# Patient Record
Sex: Female | Born: 1945 | Race: Black or African American | Hispanic: No | Marital: Married | Smoking: Former smoker
Health system: Southern US, Community
[De-identification: ages and names within clinical notes are randomized; demographics above are authoritative.]

## PROBLEM LIST (undated history)

## (undated) ENCOUNTER — Inpatient Hospital Stay: Admission: EM | Payer: Self-pay | Source: Home / Self Care

## (undated) DIAGNOSIS — D509 Iron deficiency anemia, unspecified: Secondary | ICD-10-CM

## (undated) DIAGNOSIS — D0590 Unspecified type of carcinoma in situ of unspecified breast: Secondary | ICD-10-CM

## (undated) DIAGNOSIS — E785 Hyperlipidemia, unspecified: Secondary | ICD-10-CM

## (undated) DIAGNOSIS — J449 Chronic obstructive pulmonary disease, unspecified: Secondary | ICD-10-CM

## (undated) DIAGNOSIS — E1142 Type 2 diabetes mellitus with diabetic polyneuropathy: Secondary | ICD-10-CM

## (undated) DIAGNOSIS — K219 Gastro-esophageal reflux disease without esophagitis: Secondary | ICD-10-CM

## (undated) DIAGNOSIS — M545 Low back pain, unspecified: Secondary | ICD-10-CM

## (undated) DIAGNOSIS — K2990 Gastroduodenitis, unspecified, without bleeding: Secondary | ICD-10-CM

## (undated) DIAGNOSIS — I10 Essential (primary) hypertension: Secondary | ICD-10-CM

## (undated) DIAGNOSIS — N189 Chronic kidney disease, unspecified: Secondary | ICD-10-CM

## (undated) DIAGNOSIS — R011 Cardiac murmur, unspecified: Secondary | ICD-10-CM

## (undated) DIAGNOSIS — I509 Heart failure, unspecified: Secondary | ICD-10-CM

## (undated) DIAGNOSIS — M1990 Unspecified osteoarthritis, unspecified site: Secondary | ICD-10-CM

## (undated) DIAGNOSIS — N289 Disorder of kidney and ureter, unspecified: Secondary | ICD-10-CM

## (undated) DIAGNOSIS — G4733 Obstructive sleep apnea (adult) (pediatric): Secondary | ICD-10-CM

## (undated) DIAGNOSIS — H547 Unspecified visual loss: Secondary | ICD-10-CM

## (undated) HISTORY — DX: Unspecified visual loss: H54.7

## (undated) HISTORY — DX: Low back pain, unspecified: M54.50

## (undated) HISTORY — DX: Low back pain: M54.5

## (undated) HISTORY — PX: EYE SURGERY: SHX253

## (undated) HISTORY — DX: Essential (primary) hypertension: I10

## (undated) HISTORY — DX: Iron deficiency anemia, unspecified: D50.9

## (undated) HISTORY — DX: Obstructive sleep apnea (adult) (pediatric): G47.33

## (undated) HISTORY — DX: Type 2 diabetes mellitus with diabetic polyneuropathy: E11.42

## (undated) HISTORY — DX: Chronic kidney disease, unspecified: N18.9

## (undated) HISTORY — DX: Disorder of kidney and ureter, unspecified: N28.9

## (undated) HISTORY — DX: Hyperlipidemia, unspecified: E78.5

## (undated) HISTORY — DX: Chronic obstructive pulmonary disease, unspecified: J44.9

## (undated) HISTORY — PX: LIPOMA EXCISION: SHX5283

## (undated) HISTORY — DX: Gastroduodenitis, unspecified, without bleeding: K29.90

## (undated) HISTORY — DX: Unspecified type of carcinoma in situ of unspecified breast: D05.90

---

## 1994-02-02 DIAGNOSIS — K2990 Gastroduodenitis, unspecified, without bleeding: Secondary | ICD-10-CM

## 1994-02-02 HISTORY — DX: Gastroduodenitis, unspecified, without bleeding: K29.90

## 1997-02-02 DIAGNOSIS — D0590 Unspecified type of carcinoma in situ of unspecified breast: Secondary | ICD-10-CM

## 1997-02-02 HISTORY — DX: Unspecified type of carcinoma in situ of unspecified breast: D05.90

## 1997-02-02 HISTORY — PX: BREAST BIOPSY: SHX20

## 1997-02-02 HISTORY — PX: BREAST LUMPECTOMY: SHX2

## 1997-08-24 ENCOUNTER — Ambulatory Visit (HOSPITAL_COMMUNITY): Admission: RE | Admit: 1997-08-24 | Discharge: 1997-08-24 | Payer: Self-pay | Admitting: Obstetrics

## 1997-09-17 ENCOUNTER — Ambulatory Visit (HOSPITAL_COMMUNITY): Admission: RE | Admit: 1997-09-17 | Discharge: 1997-09-17 | Payer: Self-pay | Admitting: General Surgery

## 1997-10-22 ENCOUNTER — Encounter: Admission: RE | Admit: 1997-10-22 | Discharge: 1998-01-20 | Payer: Self-pay | Admitting: Radiation Oncology

## 1998-06-11 ENCOUNTER — Ambulatory Visit (HOSPITAL_COMMUNITY): Admission: RE | Admit: 1998-06-11 | Discharge: 1998-06-11 | Payer: Self-pay | Admitting: Hematology and Oncology

## 1998-06-11 ENCOUNTER — Encounter: Payer: Self-pay | Admitting: Hematology and Oncology

## 1998-11-27 ENCOUNTER — Encounter: Admission: RE | Admit: 1998-11-27 | Discharge: 1998-11-27 | Payer: Self-pay | Admitting: Internal Medicine

## 1998-12-25 ENCOUNTER — Encounter: Admission: RE | Admit: 1998-12-25 | Discharge: 1998-12-25 | Payer: Self-pay | Admitting: Internal Medicine

## 1999-01-23 ENCOUNTER — Encounter: Admission: RE | Admit: 1999-01-23 | Discharge: 1999-01-23 | Payer: Self-pay | Admitting: Internal Medicine

## 1999-02-28 ENCOUNTER — Encounter: Admission: RE | Admit: 1999-02-28 | Discharge: 1999-02-28 | Payer: Self-pay | Admitting: Hematology and Oncology

## 1999-02-28 ENCOUNTER — Encounter: Payer: Self-pay | Admitting: Hematology and Oncology

## 1999-03-12 ENCOUNTER — Encounter: Admission: RE | Admit: 1999-03-12 | Discharge: 1999-03-12 | Payer: Self-pay | Admitting: Internal Medicine

## 1999-04-07 ENCOUNTER — Ambulatory Visit: Admission: RE | Admit: 1999-04-07 | Discharge: 1999-04-07 | Payer: Self-pay | Admitting: Internal Medicine

## 1999-04-24 ENCOUNTER — Encounter: Admission: RE | Admit: 1999-04-24 | Discharge: 1999-04-24 | Payer: Self-pay | Admitting: Hematology and Oncology

## 1999-04-30 ENCOUNTER — Encounter: Admission: RE | Admit: 1999-04-30 | Discharge: 1999-04-30 | Payer: Self-pay | Admitting: Internal Medicine

## 1999-05-13 ENCOUNTER — Other Ambulatory Visit: Admission: RE | Admit: 1999-05-13 | Discharge: 1999-05-13 | Payer: Self-pay | Admitting: Obstetrics & Gynecology

## 1999-05-13 ENCOUNTER — Encounter: Admission: RE | Admit: 1999-05-13 | Discharge: 1999-05-13 | Payer: Self-pay | Admitting: Obstetrics & Gynecology

## 1999-05-21 ENCOUNTER — Ambulatory Visit (HOSPITAL_COMMUNITY): Admission: RE | Admit: 1999-05-21 | Discharge: 1999-05-21 | Payer: Self-pay | Admitting: Obstetrics & Gynecology

## 1999-05-21 ENCOUNTER — Encounter: Payer: Self-pay | Admitting: Obstetrics & Gynecology

## 1999-05-21 ENCOUNTER — Encounter: Admission: RE | Admit: 1999-05-21 | Discharge: 1999-05-21 | Payer: Self-pay | Admitting: Internal Medicine

## 1999-06-04 ENCOUNTER — Encounter: Payer: Self-pay | Admitting: Hematology and Oncology

## 1999-06-04 ENCOUNTER — Encounter: Admission: RE | Admit: 1999-06-04 | Discharge: 1999-06-04 | Payer: Self-pay | Admitting: Hematology and Oncology

## 1999-07-02 ENCOUNTER — Encounter: Admission: RE | Admit: 1999-07-02 | Discharge: 1999-07-02 | Payer: Self-pay | Admitting: Internal Medicine

## 1999-07-02 ENCOUNTER — Ambulatory Visit (HOSPITAL_COMMUNITY): Admission: RE | Admit: 1999-07-02 | Discharge: 1999-07-02 | Payer: Self-pay | Admitting: Internal Medicine

## 1999-07-14 ENCOUNTER — Encounter (INDEPENDENT_AMBULATORY_CARE_PROVIDER_SITE_OTHER): Payer: Self-pay | Admitting: *Deleted

## 1999-07-14 ENCOUNTER — Ambulatory Visit (HOSPITAL_COMMUNITY): Admission: RE | Admit: 1999-07-14 | Discharge: 1999-07-14 | Payer: Self-pay | Admitting: General Surgery

## 1999-07-16 ENCOUNTER — Encounter: Admission: RE | Admit: 1999-07-16 | Discharge: 1999-07-16 | Payer: Self-pay | Admitting: Internal Medicine

## 1999-08-12 ENCOUNTER — Encounter: Admission: RE | Admit: 1999-08-12 | Discharge: 1999-08-12 | Payer: Self-pay | Admitting: Obstetrics

## 1999-08-15 ENCOUNTER — Encounter: Admission: RE | Admit: 1999-08-15 | Discharge: 1999-08-15 | Payer: Self-pay | Admitting: Internal Medicine

## 1999-08-22 ENCOUNTER — Encounter: Admission: RE | Admit: 1999-08-22 | Discharge: 1999-08-22 | Payer: Self-pay | Admitting: Internal Medicine

## 1999-10-01 ENCOUNTER — Encounter: Admission: RE | Admit: 1999-10-01 | Discharge: 1999-10-01 | Payer: Self-pay | Admitting: Internal Medicine

## 1999-10-15 ENCOUNTER — Encounter: Admission: RE | Admit: 1999-10-15 | Discharge: 1999-10-15 | Payer: Self-pay | Admitting: Hematology and Oncology

## 1999-10-23 ENCOUNTER — Encounter: Admission: RE | Admit: 1999-10-23 | Discharge: 1999-10-23 | Payer: Self-pay | Admitting: Obstetrics

## 1999-10-30 ENCOUNTER — Ambulatory Visit (HOSPITAL_COMMUNITY): Admission: RE | Admit: 1999-10-30 | Discharge: 1999-10-30 | Payer: Self-pay | Admitting: Obstetrics

## 1999-10-30 ENCOUNTER — Encounter: Payer: Self-pay | Admitting: Obstetrics

## 1999-11-04 ENCOUNTER — Ambulatory Visit: Admission: RE | Admit: 1999-11-04 | Discharge: 1999-11-04 | Payer: Self-pay | Admitting: Gynecology

## 1999-11-12 ENCOUNTER — Ambulatory Visit (HOSPITAL_COMMUNITY): Admission: RE | Admit: 1999-11-12 | Discharge: 1999-11-12 | Payer: Self-pay | Admitting: Internal Medicine

## 1999-11-12 ENCOUNTER — Encounter: Payer: Self-pay | Admitting: Internal Medicine

## 1999-11-12 ENCOUNTER — Encounter: Admission: RE | Admit: 1999-11-12 | Discharge: 1999-11-12 | Payer: Self-pay | Admitting: Internal Medicine

## 1999-11-18 ENCOUNTER — Inpatient Hospital Stay (HOSPITAL_COMMUNITY): Admission: RE | Admit: 1999-11-18 | Discharge: 1999-11-24 | Payer: Self-pay | Admitting: Gynecologic Oncology

## 1999-11-18 ENCOUNTER — Encounter (INDEPENDENT_AMBULATORY_CARE_PROVIDER_SITE_OTHER): Payer: Self-pay

## 1999-11-18 HISTORY — PX: TOTAL ABDOMINAL HYSTERECTOMY: SHX209

## 1999-11-18 HISTORY — PX: CONDYLOMA EXCISION/FULGURATION: SHX1389

## 1999-11-19 ENCOUNTER — Encounter: Payer: Self-pay | Admitting: Obstetrics

## 1999-11-26 ENCOUNTER — Ambulatory Visit: Admission: RE | Admit: 1999-11-26 | Discharge: 1999-11-26 | Payer: Self-pay | Admitting: Gynecology

## 1999-12-02 ENCOUNTER — Ambulatory Visit: Admission: RE | Admit: 1999-12-02 | Discharge: 1999-12-02 | Payer: Self-pay | Admitting: Gynecology

## 1999-12-16 ENCOUNTER — Ambulatory Visit: Admission: RE | Admit: 1999-12-16 | Discharge: 1999-12-16 | Payer: Self-pay | Admitting: Gynecology

## 2000-01-01 ENCOUNTER — Encounter: Admission: RE | Admit: 2000-01-01 | Discharge: 2000-01-01 | Payer: Self-pay | Admitting: Hematology and Oncology

## 2000-01-14 ENCOUNTER — Ambulatory Visit: Admission: RE | Admit: 2000-01-14 | Discharge: 2000-01-14 | Payer: Self-pay | Admitting: Gynecology

## 2000-02-19 ENCOUNTER — Encounter: Admission: RE | Admit: 2000-02-19 | Discharge: 2000-02-19 | Payer: Self-pay | Admitting: Internal Medicine

## 2000-03-09 ENCOUNTER — Encounter: Payer: Self-pay | Admitting: Hematology and Oncology

## 2000-03-09 ENCOUNTER — Encounter: Admission: RE | Admit: 2000-03-09 | Discharge: 2000-03-09 | Payer: Self-pay | Admitting: Hematology and Oncology

## 2000-03-31 ENCOUNTER — Encounter: Admission: RE | Admit: 2000-03-31 | Discharge: 2000-03-31 | Payer: Self-pay | Admitting: Internal Medicine

## 2000-04-29 ENCOUNTER — Encounter: Admission: RE | Admit: 2000-04-29 | Discharge: 2000-04-29 | Payer: Self-pay | Admitting: Internal Medicine

## 2000-06-15 ENCOUNTER — Encounter: Payer: Self-pay | Admitting: Hematology and Oncology

## 2000-06-15 ENCOUNTER — Encounter: Admission: RE | Admit: 2000-06-15 | Discharge: 2000-06-15 | Payer: Self-pay | Admitting: Hematology and Oncology

## 2000-06-16 ENCOUNTER — Encounter: Admission: RE | Admit: 2000-06-16 | Discharge: 2000-06-16 | Payer: Self-pay | Admitting: Internal Medicine

## 2000-06-25 ENCOUNTER — Encounter: Admission: RE | Admit: 2000-06-25 | Discharge: 2000-06-25 | Payer: Self-pay | Admitting: Internal Medicine

## 2000-08-04 ENCOUNTER — Encounter: Admission: RE | Admit: 2000-08-04 | Discharge: 2000-08-04 | Payer: Self-pay | Admitting: Internal Medicine

## 2000-08-04 ENCOUNTER — Ambulatory Visit (HOSPITAL_COMMUNITY): Admission: RE | Admit: 2000-08-04 | Discharge: 2000-08-04 | Payer: Self-pay | Admitting: Internal Medicine

## 2000-08-04 ENCOUNTER — Encounter: Payer: Self-pay | Admitting: Internal Medicine

## 2000-10-13 ENCOUNTER — Encounter: Admission: RE | Admit: 2000-10-13 | Discharge: 2000-10-13 | Payer: Self-pay | Admitting: Internal Medicine

## 2000-11-24 ENCOUNTER — Encounter: Admission: RE | Admit: 2000-11-24 | Discharge: 2000-11-24 | Payer: Self-pay | Admitting: Internal Medicine

## 2001-02-02 HISTORY — PX: BREAST BIOPSY: SHX20

## 2001-02-09 ENCOUNTER — Encounter: Admission: RE | Admit: 2001-02-09 | Discharge: 2001-02-09 | Payer: Self-pay | Admitting: Internal Medicine

## 2001-04-12 ENCOUNTER — Encounter (HOSPITAL_BASED_OUTPATIENT_CLINIC_OR_DEPARTMENT_OTHER): Payer: Self-pay | Admitting: General Surgery

## 2001-04-13 ENCOUNTER — Encounter: Admission: RE | Admit: 2001-04-13 | Discharge: 2001-04-13 | Payer: Self-pay | Admitting: Internal Medicine

## 2001-04-14 ENCOUNTER — Ambulatory Visit (HOSPITAL_COMMUNITY): Admission: RE | Admit: 2001-04-14 | Discharge: 2001-04-14 | Payer: Self-pay | Admitting: General Surgery

## 2001-04-14 ENCOUNTER — Encounter (INDEPENDENT_AMBULATORY_CARE_PROVIDER_SITE_OTHER): Payer: Self-pay | Admitting: Specialist

## 2001-04-14 ENCOUNTER — Encounter (HOSPITAL_BASED_OUTPATIENT_CLINIC_OR_DEPARTMENT_OTHER): Payer: Self-pay | Admitting: General Surgery

## 2001-06-22 ENCOUNTER — Encounter: Payer: Self-pay | Admitting: Hematology and Oncology

## 2001-06-22 ENCOUNTER — Encounter: Admission: RE | Admit: 2001-06-22 | Discharge: 2001-06-22 | Payer: Self-pay | Admitting: Hematology and Oncology

## 2001-07-13 ENCOUNTER — Ambulatory Visit (HOSPITAL_COMMUNITY): Admission: RE | Admit: 2001-07-13 | Discharge: 2001-07-13 | Payer: Self-pay | Admitting: Internal Medicine

## 2001-07-13 ENCOUNTER — Encounter: Admission: RE | Admit: 2001-07-13 | Discharge: 2001-07-13 | Payer: Self-pay | Admitting: Internal Medicine

## 2001-07-13 ENCOUNTER — Encounter: Payer: Self-pay | Admitting: Internal Medicine

## 2001-08-03 ENCOUNTER — Encounter (HOSPITAL_BASED_OUTPATIENT_CLINIC_OR_DEPARTMENT_OTHER): Payer: Self-pay | Admitting: General Surgery

## 2001-08-03 ENCOUNTER — Encounter: Admission: RE | Admit: 2001-08-03 | Discharge: 2001-08-03 | Payer: Self-pay | Admitting: General Surgery

## 2001-08-10 ENCOUNTER — Encounter: Admission: RE | Admit: 2001-08-10 | Discharge: 2001-08-10 | Payer: Self-pay | Admitting: General Surgery

## 2001-08-10 ENCOUNTER — Encounter (HOSPITAL_BASED_OUTPATIENT_CLINIC_OR_DEPARTMENT_OTHER): Payer: Self-pay | Admitting: General Surgery

## 2001-09-07 ENCOUNTER — Encounter (HOSPITAL_BASED_OUTPATIENT_CLINIC_OR_DEPARTMENT_OTHER): Payer: Self-pay | Admitting: General Surgery

## 2001-09-07 ENCOUNTER — Encounter: Admission: RE | Admit: 2001-09-07 | Discharge: 2001-09-07 | Payer: Self-pay | Admitting: General Surgery

## 2001-09-14 ENCOUNTER — Encounter: Admission: RE | Admit: 2001-09-14 | Discharge: 2001-09-14 | Payer: Self-pay | Admitting: Internal Medicine

## 2001-09-21 ENCOUNTER — Encounter: Admission: RE | Admit: 2001-09-21 | Discharge: 2001-09-21 | Payer: Self-pay | Admitting: General Surgery

## 2001-09-21 ENCOUNTER — Encounter (INDEPENDENT_AMBULATORY_CARE_PROVIDER_SITE_OTHER): Payer: Self-pay | Admitting: *Deleted

## 2001-09-21 ENCOUNTER — Encounter (HOSPITAL_BASED_OUTPATIENT_CLINIC_OR_DEPARTMENT_OTHER): Payer: Self-pay | Admitting: General Surgery

## 2001-10-17 ENCOUNTER — Ambulatory Visit (HOSPITAL_COMMUNITY): Admission: RE | Admit: 2001-10-17 | Discharge: 2001-10-17 | Payer: Self-pay | Admitting: General Surgery

## 2001-10-17 ENCOUNTER — Encounter (HOSPITAL_BASED_OUTPATIENT_CLINIC_OR_DEPARTMENT_OTHER): Payer: Self-pay | Admitting: General Surgery

## 2002-01-11 ENCOUNTER — Encounter: Admission: RE | Admit: 2002-01-11 | Discharge: 2002-01-11 | Payer: Self-pay | Admitting: Internal Medicine

## 2002-03-08 ENCOUNTER — Encounter: Admission: RE | Admit: 2002-03-08 | Discharge: 2002-03-08 | Payer: Self-pay | Admitting: Internal Medicine

## 2002-03-13 ENCOUNTER — Encounter: Payer: Self-pay | Admitting: Oncology

## 2002-03-13 ENCOUNTER — Encounter: Admission: RE | Admit: 2002-03-13 | Discharge: 2002-03-13 | Payer: Self-pay | Admitting: Oncology

## 2002-03-22 ENCOUNTER — Ambulatory Visit (HOSPITAL_COMMUNITY): Admission: RE | Admit: 2002-03-22 | Discharge: 2002-03-22 | Payer: Self-pay | Admitting: Oncology

## 2002-03-22 ENCOUNTER — Encounter: Payer: Self-pay | Admitting: Oncology

## 2002-04-19 ENCOUNTER — Encounter: Admission: RE | Admit: 2002-04-19 | Discharge: 2002-04-19 | Payer: Self-pay | Admitting: Internal Medicine

## 2002-06-21 ENCOUNTER — Encounter: Admission: RE | Admit: 2002-06-21 | Discharge: 2002-06-21 | Payer: Self-pay | Admitting: Internal Medicine

## 2002-08-16 ENCOUNTER — Encounter: Admission: RE | Admit: 2002-08-16 | Discharge: 2002-08-16 | Payer: Self-pay | Admitting: Internal Medicine

## 2002-08-16 ENCOUNTER — Ambulatory Visit (HOSPITAL_COMMUNITY): Admission: RE | Admit: 2002-08-16 | Discharge: 2002-08-16 | Payer: Self-pay | Admitting: Internal Medicine

## 2002-08-16 ENCOUNTER — Encounter: Payer: Self-pay | Admitting: Internal Medicine

## 2002-08-30 ENCOUNTER — Encounter: Admission: RE | Admit: 2002-08-30 | Discharge: 2002-08-30 | Payer: Self-pay | Admitting: Internal Medicine

## 2002-09-11 ENCOUNTER — Encounter (HOSPITAL_COMMUNITY): Admission: RE | Admit: 2002-09-11 | Discharge: 2002-12-10 | Payer: Self-pay | Admitting: Oncology

## 2002-09-11 ENCOUNTER — Encounter: Payer: Self-pay | Admitting: Oncology

## 2002-11-29 ENCOUNTER — Encounter: Admission: RE | Admit: 2002-11-29 | Discharge: 2002-11-29 | Payer: Self-pay | Admitting: Internal Medicine

## 2002-12-20 ENCOUNTER — Encounter: Admission: RE | Admit: 2002-12-20 | Discharge: 2002-12-20 | Payer: Self-pay | Admitting: Internal Medicine

## 2003-03-16 ENCOUNTER — Encounter: Admission: RE | Admit: 2003-03-16 | Discharge: 2003-03-16 | Payer: Self-pay | Admitting: Oncology

## 2003-03-21 ENCOUNTER — Encounter: Admission: RE | Admit: 2003-03-21 | Discharge: 2003-03-21 | Payer: Self-pay | Admitting: Internal Medicine

## 2003-04-20 ENCOUNTER — Encounter: Admission: RE | Admit: 2003-04-20 | Discharge: 2003-04-20 | Payer: Self-pay | Admitting: Internal Medicine

## 2003-06-20 ENCOUNTER — Encounter: Admission: RE | Admit: 2003-06-20 | Discharge: 2003-06-20 | Payer: Self-pay | Admitting: Internal Medicine

## 2003-10-17 ENCOUNTER — Ambulatory Visit (HOSPITAL_COMMUNITY): Admission: RE | Admit: 2003-10-17 | Discharge: 2003-10-17 | Payer: Self-pay | Admitting: Internal Medicine

## 2003-10-17 ENCOUNTER — Ambulatory Visit: Payer: Self-pay | Admitting: Internal Medicine

## 2003-10-29 ENCOUNTER — Ambulatory Visit: Payer: Self-pay | Admitting: Internal Medicine

## 2003-11-19 ENCOUNTER — Ambulatory Visit: Payer: Self-pay | Admitting: Infectious Diseases

## 2003-11-19 ENCOUNTER — Inpatient Hospital Stay (HOSPITAL_COMMUNITY): Admission: EM | Admit: 2003-11-19 | Discharge: 2003-11-20 | Payer: Self-pay | Admitting: Emergency Medicine

## 2003-11-27 ENCOUNTER — Ambulatory Visit: Payer: Self-pay

## 2003-11-27 ENCOUNTER — Inpatient Hospital Stay (HOSPITAL_COMMUNITY): Admission: AD | Admit: 2003-11-27 | Discharge: 2003-12-01 | Payer: Self-pay | Admitting: Internal Medicine

## 2003-12-05 ENCOUNTER — Ambulatory Visit: Payer: Self-pay | Admitting: Internal Medicine

## 2003-12-12 ENCOUNTER — Ambulatory Visit (HOSPITAL_BASED_OUTPATIENT_CLINIC_OR_DEPARTMENT_OTHER): Admission: RE | Admit: 2003-12-12 | Discharge: 2003-12-12 | Payer: Self-pay | Admitting: Infectious Diseases

## 2003-12-31 ENCOUNTER — Ambulatory Visit: Payer: Self-pay | Admitting: Internal Medicine

## 2004-01-08 ENCOUNTER — Ambulatory Visit: Payer: Self-pay | Admitting: Internal Medicine

## 2004-02-06 ENCOUNTER — Ambulatory Visit: Payer: Self-pay | Admitting: Internal Medicine

## 2004-02-06 ENCOUNTER — Ambulatory Visit: Payer: Self-pay

## 2004-02-20 ENCOUNTER — Ambulatory Visit: Payer: Self-pay | Admitting: Internal Medicine

## 2004-03-26 ENCOUNTER — Encounter: Admission: RE | Admit: 2004-03-26 | Discharge: 2004-03-26 | Payer: Self-pay | Admitting: Oncology

## 2004-04-09 ENCOUNTER — Ambulatory Visit: Payer: Self-pay | Admitting: Internal Medicine

## 2004-04-16 ENCOUNTER — Ambulatory Visit: Payer: Self-pay | Admitting: Internal Medicine

## 2004-04-23 ENCOUNTER — Ambulatory Visit: Payer: Self-pay | Admitting: Internal Medicine

## 2004-05-07 ENCOUNTER — Ambulatory Visit: Payer: Self-pay | Admitting: Internal Medicine

## 2004-06-11 ENCOUNTER — Ambulatory Visit: Payer: Self-pay | Admitting: Internal Medicine

## 2004-06-20 ENCOUNTER — Ambulatory Visit: Payer: Self-pay | Admitting: Oncology

## 2004-07-29 ENCOUNTER — Ambulatory Visit: Payer: Self-pay | Admitting: Internal Medicine

## 2004-08-15 ENCOUNTER — Ambulatory Visit (HOSPITAL_COMMUNITY): Admission: RE | Admit: 2004-08-15 | Discharge: 2004-08-15 | Payer: Self-pay | Admitting: Gastroenterology

## 2004-08-15 ENCOUNTER — Encounter (INDEPENDENT_AMBULATORY_CARE_PROVIDER_SITE_OTHER): Payer: Self-pay | Admitting: *Deleted

## 2004-08-15 LAB — HM COLONOSCOPY

## 2004-08-27 ENCOUNTER — Ambulatory Visit (HOSPITAL_COMMUNITY): Admission: RE | Admit: 2004-08-27 | Discharge: 2004-08-27 | Payer: Self-pay | Admitting: Gastroenterology

## 2004-11-05 ENCOUNTER — Ambulatory Visit: Payer: Self-pay | Admitting: Internal Medicine

## 2005-01-14 ENCOUNTER — Ambulatory Visit: Payer: Self-pay | Admitting: Internal Medicine

## 2005-02-27 ENCOUNTER — Ambulatory Visit: Payer: Self-pay | Admitting: Internal Medicine

## 2005-03-27 ENCOUNTER — Encounter: Admission: RE | Admit: 2005-03-27 | Discharge: 2005-03-27 | Payer: Self-pay | Admitting: Oncology

## 2005-04-01 ENCOUNTER — Ambulatory Visit: Payer: Self-pay | Admitting: Internal Medicine

## 2005-05-20 ENCOUNTER — Ambulatory Visit: Payer: Self-pay | Admitting: Internal Medicine

## 2005-06-30 ENCOUNTER — Ambulatory Visit: Payer: Self-pay | Admitting: Oncology

## 2005-06-30 LAB — CBC & DIFF AND RETIC
Basophils Absolute: 0 10*3/uL (ref 0.0–0.1)
Eosinophils Absolute: 0.1 10*3/uL (ref 0.0–0.5)
HGB: 8.9 g/dL — ABNORMAL LOW (ref 11.6–15.9)
MONO#: 0.5 10*3/uL (ref 0.1–0.9)
NEUT#: 4 10*3/uL (ref 1.5–6.5)
Platelets: 310 10*3/uL (ref 145–400)
RBC: 3.81 10*6/uL (ref 3.70–5.32)
RDW: 15.7 % — ABNORMAL HIGH (ref 11.3–14.5)
RETIC #: 88.4 10*3/uL (ref 19.7–115.1)
Retic %: 2.3 % (ref 0.4–2.3)
WBC: 6.1 10*3/uL (ref 3.9–10.0)

## 2005-06-30 LAB — MORPHOLOGY

## 2005-07-03 LAB — FERRITIN: Ferritin: 85 ng/mL (ref 10–291)

## 2005-07-03 LAB — IRON AND TIBC: %SAT: 16 % — ABNORMAL LOW (ref 20–55)

## 2005-07-22 ENCOUNTER — Ambulatory Visit: Payer: Self-pay | Admitting: Internal Medicine

## 2005-08-17 ENCOUNTER — Ambulatory Visit: Payer: Self-pay | Admitting: Oncology

## 2005-09-23 ENCOUNTER — Ambulatory Visit: Payer: Self-pay | Admitting: Internal Medicine

## 2005-11-05 ENCOUNTER — Ambulatory Visit: Payer: Self-pay | Admitting: Internal Medicine

## 2005-11-17 ENCOUNTER — Ambulatory Visit: Payer: Self-pay | Admitting: Internal Medicine

## 2005-11-17 LAB — CONVERTED CEMR LAB
BUN: 19 mg/dL (ref 6–23)
CO2: 28 meq/L (ref 19–32)
Calcium: 8.6 mg/dL (ref 8.4–10.5)
Chloride: 105 meq/L (ref 96–112)
Creatinine, Ser: 1.36 mg/dL — ABNORMAL HIGH (ref 0.40–1.20)
Glucose, Bld: 69 mg/dL — ABNORMAL LOW (ref 70–99)
Potassium: 3.8 meq/L (ref 3.5–5.3)
Sodium: 144 meq/L (ref 135–145)

## 2006-01-13 ENCOUNTER — Ambulatory Visit: Payer: Self-pay | Admitting: Internal Medicine

## 2006-01-13 LAB — CONVERTED CEMR LAB
ALT: 10 units/L (ref 0–35)
AST: 15 units/L (ref 0–37)
Albumin: 4.3 g/dL (ref 3.5–5.2)
Alkaline Phosphatase: 64 units/L (ref 39–117)
BUN: 21 mg/dL (ref 6–23)
Basophils Absolute: 0 10*3/uL (ref 0.0–0.1)
Basophils Relative: 0 % (ref 0–1)
CO2: 24 meq/L (ref 19–32)
Calcium: 9.2 mg/dL (ref 8.4–10.5)
Chloride: 107 meq/L (ref 96–112)
Creatinine, Ser: 1.32 mg/dL — ABNORMAL HIGH (ref 0.40–1.20)
Eosinophils Relative: 2 % (ref 0–4)
Glucose, Bld: 94 mg/dL (ref 70–99)
HCT: 31.8 % — ABNORMAL LOW (ref 34.4–43.3)
Hemoglobin: 9.4 g/dL — ABNORMAL LOW (ref 11.7–14.8)
Lymphocytes Relative: 23 % (ref 15–43)
Lymphs Abs: 1.8 10*3/uL (ref 0.8–3.1)
MCHC: 29.6 g/dL — ABNORMAL LOW (ref 33.1–35.4)
MCV: 74.5 fL — ABNORMAL LOW (ref 78.8–100.0)
Monocytes Absolute: 0.5 10*3/uL (ref 0.2–0.7)
Monocytes Relative: 6 % (ref 3–11)
Neutro Abs: 5.6 10*3/uL (ref 1.8–6.8)
Neutrophils Relative %: 69 % (ref 47–77)
Platelets: 332 10*3/uL (ref 152–374)
Potassium: 4 meq/L (ref 3.5–5.3)
RBC: 4.27 M/uL (ref 3.79–4.96)
RDW: 15.8 % — ABNORMAL HIGH (ref 11.5–15.3)
Sodium: 144 meq/L (ref 135–145)
Total Bilirubin: 0.4 mg/dL (ref 0.3–1.2)
Total Protein: 7.7 g/dL (ref 6.0–8.3)
WBC: 8.1 10*3/uL (ref 3.7–10.0)

## 2006-01-15 IMAGING — CR DG CHEST 1V PORT
1 series · 1 of 1 positions shown · non-contrast
Comparison: none

CLINICAL DATA: Shortness of breath.  Hypertension.  Congestive heart failure.  
 CHEST PORTABLE ONE VIEW 
 AP view of the chest dated 11/19/03 is compared to the prior study of 10/17/03.  Since the previous study, there appears to be slight interval increase in heart size with evidence of vascular congestion and bilateral airspace density felt most likely to represent pulmonary edema.  The pulmonary edema appears to be more striking on the right than left.  
 IMPRESSION
 Findings suggesting cardiomegaly with pulmonary edema with discussion as above.

[view not recorded]
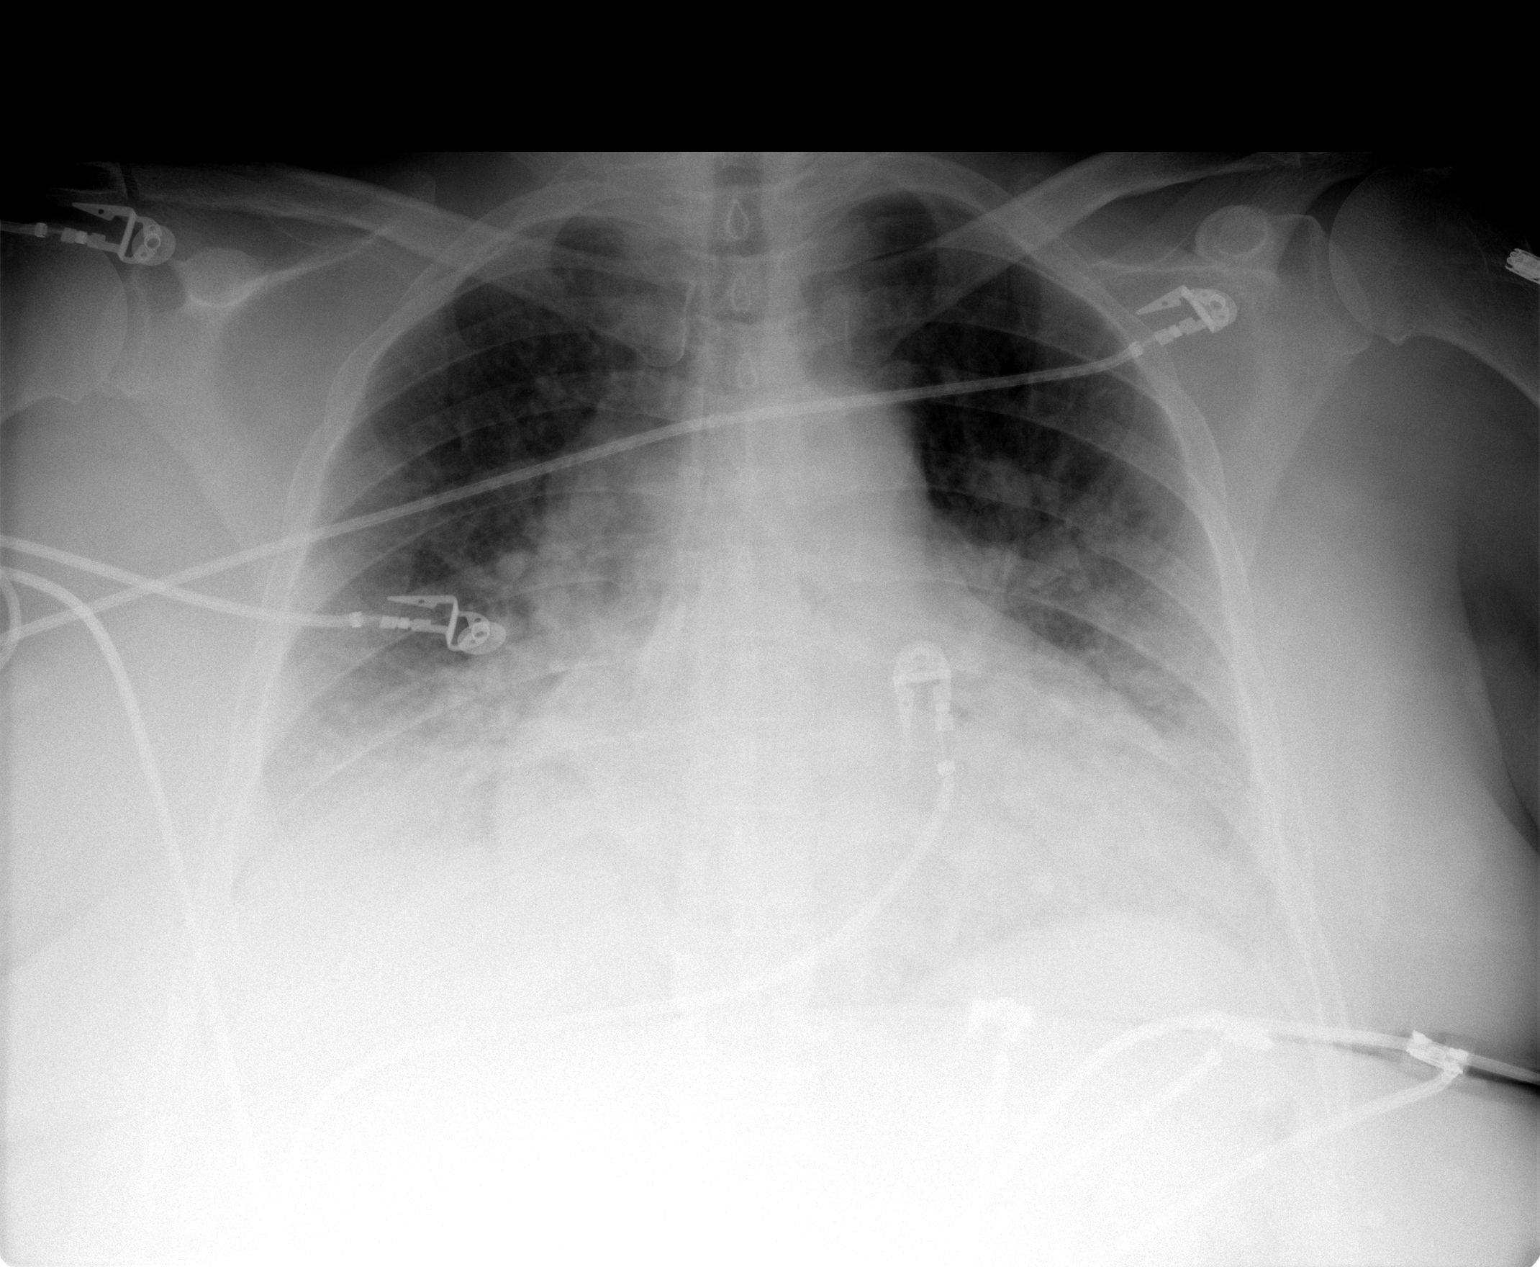

[1 of 1 positions shown; findings below may reference images not displayed]

## 2006-03-10 ENCOUNTER — Ambulatory Visit: Payer: Self-pay | Admitting: Internal Medicine

## 2006-03-10 DIAGNOSIS — N189 Chronic kidney disease, unspecified: Secondary | ICD-10-CM

## 2006-03-10 DIAGNOSIS — E1142 Type 2 diabetes mellitus with diabetic polyneuropathy: Secondary | ICD-10-CM

## 2006-03-10 DIAGNOSIS — E1169 Type 2 diabetes mellitus with other specified complication: Secondary | ICD-10-CM

## 2006-03-10 DIAGNOSIS — D631 Anemia in chronic kidney disease: Secondary | ICD-10-CM

## 2006-03-10 DIAGNOSIS — J449 Chronic obstructive pulmonary disease, unspecified: Secondary | ICD-10-CM

## 2006-03-10 DIAGNOSIS — E785 Hyperlipidemia, unspecified: Secondary | ICD-10-CM

## 2006-03-10 DIAGNOSIS — G4730 Sleep apnea, unspecified: Secondary | ICD-10-CM

## 2006-03-10 DIAGNOSIS — I10 Essential (primary) hypertension: Secondary | ICD-10-CM

## 2006-03-10 DIAGNOSIS — E1159 Type 2 diabetes mellitus with other circulatory complications: Secondary | ICD-10-CM

## 2006-03-10 LAB — CONVERTED CEMR LAB
Glucose, Bld: 169 mg/dL
Hgb A1c MFr Bld: 5.6 %

## 2006-03-11 ENCOUNTER — Encounter: Admission: RE | Admit: 2006-03-11 | Discharge: 2006-03-11 | Payer: Self-pay | Admitting: Internal Medicine

## 2006-03-12 ENCOUNTER — Telehealth: Payer: Self-pay | Admitting: Internal Medicine

## 2006-03-16 ENCOUNTER — Encounter: Payer: Self-pay | Admitting: Internal Medicine

## 2006-03-16 ENCOUNTER — Ambulatory Visit: Payer: Self-pay | Admitting: Hospitalist

## 2006-03-16 LAB — CONVERTED CEMR LAB: Blood Glucose, Fingerstick: 94

## 2006-03-18 ENCOUNTER — Encounter: Payer: Self-pay | Admitting: Internal Medicine

## 2006-03-19 ENCOUNTER — Telehealth (INDEPENDENT_AMBULATORY_CARE_PROVIDER_SITE_OTHER): Payer: Self-pay | Admitting: *Deleted

## 2006-03-22 ENCOUNTER — Ambulatory Visit: Payer: Self-pay | Admitting: Internal Medicine

## 2006-03-26 ENCOUNTER — Encounter: Payer: Self-pay | Admitting: Internal Medicine

## 2006-03-26 LAB — CONVERTED CEMR LAB
ALT: 10 units/L (ref 0–35)
AST: 15 units/L (ref 0–37)
Albumin: 4.1 g/dL (ref 3.5–5.2)
Alkaline Phosphatase: 59 units/L (ref 39–117)
BUN: 26 mg/dL — ABNORMAL HIGH (ref 6–23)
Basophils Absolute: 0 10*3/uL (ref 0.0–0.1)
Basophils Relative: 0 % (ref 0–1)
CO2: 24 meq/L (ref 19–32)
Calcium: 8.2 mg/dL — ABNORMAL LOW (ref 8.4–10.5)
Chloride: 103 meq/L (ref 96–112)
Cholesterol: 90 mg/dL (ref 0–200)
Creatinine, Ser: 1.6 mg/dL — ABNORMAL HIGH (ref 0.40–1.20)
Eosinophils Absolute: 0.2 10*3/uL (ref 0.0–0.7)
Eosinophils Relative: 2 % (ref 0–5)
Ferritin: 77 ng/mL (ref 10–291)
Glucose, Bld: 106 mg/dL — ABNORMAL HIGH (ref 70–99)
HCT: 30.4 % — ABNORMAL LOW (ref 36.0–46.0)
HDL: 32 mg/dL — ABNORMAL LOW (ref >39)
Hemoglobin: 8.7 g/dL — ABNORMAL LOW (ref 12.0–15.0)
LDL Cholesterol: 40 mg/dL (ref 0–99)
Lymphocytes Relative: 25 % (ref 12–46)
Lymphs Abs: 2.1 10*3/uL (ref 0.7–3.3)
MCHC: 28.6 g/dL — ABNORMAL LOW (ref 30.0–36.0)
MCV: 73.1 fL — ABNORMAL LOW (ref 78.0–100.0)
Monocytes Absolute: 0.5 10*3/uL (ref 0.2–0.7)
Monocytes Relative: 6 % (ref 3–11)
Neutro Abs: 5.8 10*3/uL (ref 1.7–7.7)
Neutrophils Relative %: 67 % (ref 43–77)
Platelets: 305 10*3/uL (ref 150–400)
Potassium: 3.9 meq/L (ref 3.5–5.3)
RBC: 4.16 M/uL (ref 3.87–5.11)
RDW: 15.8 % — ABNORMAL HIGH (ref 11.5–14.0)
Sodium: 143 meq/L (ref 135–145)
Total Bilirubin: 0.3 mg/dL (ref 0.3–1.2)
Total CHOL/HDL Ratio: 2.8
Total Protein: 7.7 g/dL (ref 6.0–8.3)
Triglycerides: 89 mg/dL (ref <150)
VLDL: 18 mg/dL (ref 0–40)
WBC: 8.6 10*3/uL (ref 4.0–10.5)

## 2006-03-31 ENCOUNTER — Telehealth (INDEPENDENT_AMBULATORY_CARE_PROVIDER_SITE_OTHER): Payer: Self-pay | Admitting: *Deleted

## 2006-04-02 ENCOUNTER — Telehealth: Payer: Self-pay | Admitting: *Deleted

## 2006-04-09 ENCOUNTER — Encounter (INDEPENDENT_AMBULATORY_CARE_PROVIDER_SITE_OTHER): Payer: Self-pay | Admitting: *Deleted

## 2006-05-04 ENCOUNTER — Telehealth (INDEPENDENT_AMBULATORY_CARE_PROVIDER_SITE_OTHER): Payer: Self-pay | Admitting: *Deleted

## 2006-05-05 ENCOUNTER — Telehealth: Payer: Self-pay | Admitting: *Deleted

## 2006-05-18 ENCOUNTER — Telehealth (INDEPENDENT_AMBULATORY_CARE_PROVIDER_SITE_OTHER): Payer: Self-pay | Admitting: *Deleted

## 2006-06-04 ENCOUNTER — Telehealth (INDEPENDENT_AMBULATORY_CARE_PROVIDER_SITE_OTHER): Payer: Self-pay | Admitting: *Deleted

## 2006-06-23 ENCOUNTER — Encounter: Payer: Self-pay | Admitting: Internal Medicine

## 2006-06-30 ENCOUNTER — Encounter: Payer: Self-pay | Admitting: Internal Medicine

## 2006-07-06 ENCOUNTER — Ambulatory Visit: Payer: Self-pay | Admitting: Oncology

## 2006-07-14 ENCOUNTER — Telehealth: Payer: Self-pay | Admitting: Internal Medicine

## 2006-07-20 ENCOUNTER — Ambulatory Visit: Payer: Self-pay | Admitting: Internal Medicine

## 2006-07-20 ENCOUNTER — Encounter: Payer: Self-pay | Admitting: Internal Medicine

## 2006-07-20 LAB — CONVERTED CEMR LAB
ALT: 11 units/L (ref 0–35)
AST: 16 units/L (ref 0–37)
Albumin: 4.1 g/dL (ref 3.5–5.2)
Alkaline Phosphatase: 54 units/L (ref 39–117)
BUN: 21 mg/dL (ref 6–23)
Basophils Absolute: 0 10*3/uL (ref 0.0–0.1)
Basophils Relative: 0 % (ref 0–1)
CO2: 24 meq/L (ref 19–32)
Calcium: 8.6 mg/dL (ref 8.4–10.5)
Chloride: 105 meq/L (ref 96–112)
Creatinine, Ser: 1.27 mg/dL — ABNORMAL HIGH (ref 0.40–1.20)
Eosinophils Absolute: 0.1 10*3/uL (ref 0.0–0.7)
Eosinophils Relative: 2 % (ref 0–5)
Glucose, Bld: 133 mg/dL — ABNORMAL HIGH (ref 70–99)
HCT: 30.9 % — ABNORMAL LOW (ref 36.0–46.0)
Hemoglobin: 8.9 g/dL — ABNORMAL LOW (ref 12.0–15.0)
Lymphocytes Relative: 23 % (ref 12–46)
Lymphs Abs: 1.6 10*3/uL (ref 0.7–3.3)
MCHC: 28.8 g/dL — ABNORMAL LOW (ref 30.0–36.0)
MCV: 74.5 fL — ABNORMAL LOW (ref 78.0–100.0)
Monocytes Absolute: 0.5 10*3/uL (ref 0.2–0.7)
Monocytes Relative: 7 % (ref 3–11)
Neutro Abs: 4.8 10*3/uL (ref 1.7–7.7)
Neutrophils Relative %: 68 % (ref 43–77)
Platelets: 306 10*3/uL (ref 150–400)
Potassium: 4.2 meq/L (ref 3.5–5.3)
RBC: 4.15 M/uL (ref 3.87–5.11)
RDW: 16.2 % — ABNORMAL HIGH (ref 11.5–14.0)
Sodium: 144 meq/L (ref 135–145)
Total Bilirubin: 0.3 mg/dL (ref 0.3–1.2)
Total Protein: 7.4 g/dL (ref 6.0–8.3)
WBC: 7.1 10*3/uL (ref 4.0–10.5)

## 2006-08-04 ENCOUNTER — Telehealth: Payer: Self-pay | Admitting: *Deleted

## 2006-08-11 ENCOUNTER — Ambulatory Visit: Payer: Self-pay | Admitting: Internal Medicine

## 2006-08-11 LAB — CONVERTED CEMR LAB
BUN: 15 mg/dL (ref 6–23)
Basophils Absolute: 0 10*3/uL (ref 0.0–0.1)
Basophils Relative: 0 % (ref 0–1)
Blood Glucose, Fingerstick: 144
CO2: 27 meq/L (ref 19–32)
Calcium: 8.8 mg/dL (ref 8.4–10.5)
Chloride: 108 meq/L (ref 96–112)
Creatinine, Ser: 1.31 mg/dL — ABNORMAL HIGH (ref 0.40–1.20)
Eosinophils Absolute: 0.1 10*3/uL (ref 0.0–0.7)
Eosinophils Relative: 2 % (ref 0–5)
Ferritin: 91 ng/mL (ref 10–291)
Glucose, Bld: 70 mg/dL (ref 70–99)
HCT: 30.8 % — ABNORMAL LOW (ref 36.0–46.0)
Hemoglobin: 9.2 g/dL — ABNORMAL LOW (ref 12.0–15.0)
Hgb A1c MFr Bld: 5.8 %
Iron: 35 ug/dL — ABNORMAL LOW (ref 42–145)
Lymphocytes Relative: 29 % (ref 12–46)
Lymphs Abs: 2.1 10*3/uL (ref 0.7–3.3)
MCHC: 29.9 g/dL — ABNORMAL LOW (ref 30.0–36.0)
MCV: 73.9 fL — ABNORMAL LOW (ref 78.0–100.0)
Monocytes Absolute: 0.5 10*3/uL (ref 0.2–0.7)
Monocytes Relative: 7 % (ref 3–11)
Neutro Abs: 4.6 10*3/uL (ref 1.7–7.7)
Neutrophils Relative %: 63 % (ref 43–77)
Platelets: 336 10*3/uL (ref 150–400)
Potassium: 4.1 meq/L (ref 3.5–5.3)
RBC: 4.17 M/uL (ref 3.87–5.11)
RDW: 16.2 % — ABNORMAL HIGH (ref 11.5–14.0)
Saturation Ratios: 12 % — ABNORMAL LOW (ref 20–55)
Sodium: 145 meq/L (ref 135–145)
TIBC: 284 ug/dL (ref 250–470)
UIBC: 249 ug/dL
WBC: 7.3 10*3/uL (ref 4.0–10.5)

## 2006-09-03 ENCOUNTER — Telehealth: Payer: Self-pay | Admitting: Internal Medicine

## 2006-09-06 ENCOUNTER — Telehealth: Payer: Self-pay | Admitting: Internal Medicine

## 2006-09-10 ENCOUNTER — Encounter: Payer: Self-pay | Admitting: Internal Medicine

## 2006-10-01 ENCOUNTER — Telehealth: Payer: Self-pay | Admitting: *Deleted

## 2006-11-11 ENCOUNTER — Ambulatory Visit: Payer: Self-pay | Admitting: Internal Medicine

## 2006-11-11 LAB — CONVERTED CEMR LAB
ALT: 12 units/L (ref 0–35)
AST: 18 units/L (ref 0–37)
Albumin: 4 g/dL (ref 3.5–5.2)
Alkaline Phosphatase: 54 units/L (ref 39–117)
BUN: 20 mg/dL (ref 6–23)
Basophils Absolute: 0 10*3/uL (ref 0.0–0.1)
Basophils Relative: 0 % (ref 0–1)
Blood Glucose, Fingerstick: 164
CO2: 24 meq/L (ref 19–32)
Calcium: 8.6 mg/dL (ref 8.4–10.5)
Chloride: 108 meq/L (ref 96–112)
Creatinine, Ser: 1.3 mg/dL — ABNORMAL HIGH (ref 0.40–1.20)
Eosinophils Absolute: 0.2 10*3/uL (ref 0.0–0.7)
Eosinophils Relative: 2 % (ref 0–5)
Glucose, Bld: 100 mg/dL — ABNORMAL HIGH (ref 70–99)
HCT: 30.2 % — ABNORMAL LOW (ref 36.0–46.0)
Hemoglobin: 9.2 g/dL — ABNORMAL LOW (ref 12.0–15.0)
Hgb A1c MFr Bld: 5.6 %
Lymphocytes Relative: 28 % (ref 12–46)
Lymphs Abs: 2.3 10*3/uL (ref 0.7–3.3)
MCHC: 30.5 g/dL (ref 30.0–36.0)
MCV: 71.9 fL — ABNORMAL LOW (ref 78.0–100.0)
Monocytes Absolute: 0.6 10*3/uL (ref 0.2–0.7)
Monocytes Relative: 7 % (ref 3–11)
Neutro Abs: 5.3 10*3/uL (ref 1.7–7.7)
Neutrophils Relative %: 63 % (ref 43–77)
Platelets: 327 10*3/uL (ref 150–400)
Potassium: 3.9 meq/L (ref 3.5–5.3)
RBC: 4.2 M/uL (ref 3.87–5.11)
RDW: 15.9 % — ABNORMAL HIGH (ref 11.5–14.0)
Sodium: 146 meq/L — ABNORMAL HIGH (ref 135–145)
Total Bilirubin: 0.3 mg/dL (ref 0.3–1.2)
Total Protein: 7.2 g/dL (ref 6.0–8.3)
WBC: 8.4 10*3/uL (ref 4.0–10.5)

## 2006-12-06 ENCOUNTER — Telehealth: Payer: Self-pay | Admitting: Internal Medicine

## 2007-01-05 ENCOUNTER — Telehealth: Payer: Self-pay | Admitting: *Deleted

## 2007-02-07 ENCOUNTER — Telehealth: Payer: Self-pay | Admitting: Internal Medicine

## 2007-02-08 ENCOUNTER — Telehealth: Payer: Self-pay | Admitting: *Deleted

## 2007-02-09 ENCOUNTER — Encounter: Payer: Self-pay | Admitting: Internal Medicine

## 2007-03-07 ENCOUNTER — Telehealth: Payer: Self-pay | Admitting: Internal Medicine

## 2007-03-07 ENCOUNTER — Ambulatory Visit: Payer: Self-pay | Admitting: Internal Medicine

## 2007-03-07 LAB — CONVERTED CEMR LAB
Blood Glucose, Fingerstick: 201
Hgb A1c MFr Bld: 6.1 %

## 2007-03-14 ENCOUNTER — Encounter: Admission: RE | Admit: 2007-03-14 | Discharge: 2007-03-14 | Payer: Self-pay | Admitting: Internal Medicine

## 2007-03-14 ENCOUNTER — Telehealth: Payer: Self-pay | Admitting: Internal Medicine

## 2007-03-15 ENCOUNTER — Ambulatory Visit: Payer: Self-pay | Admitting: Internal Medicine

## 2007-03-17 LAB — CONVERTED CEMR LAB
ALT: 11 units/L (ref 0–35)
AST: 14 units/L (ref 0–37)
Albumin: 4.3 g/dL (ref 3.5–5.2)
Alkaline Phosphatase: 56 units/L (ref 39–117)
BUN: 25 mg/dL — ABNORMAL HIGH (ref 6–23)
Basophils Absolute: 0 10*3/uL (ref 0.0–0.1)
Basophils Relative: 0 % (ref 0–1)
CO2: 22 meq/L (ref 19–32)
Calcium: 9.5 mg/dL (ref 8.4–10.5)
Chloride: 108 meq/L (ref 96–112)
Cholesterol: 102 mg/dL (ref 0–200)
Creatinine, Ser: 1.47 mg/dL — ABNORMAL HIGH (ref 0.40–1.20)
Eosinophils Absolute: 0.1 10*3/uL (ref 0.0–0.7)
Eosinophils Relative: 1 % (ref 0–5)
Ferritin: 75 ng/mL (ref 10–291)
Glucose, Bld: 139 mg/dL — ABNORMAL HIGH (ref 70–99)
HCT: 30.8 % — ABNORMAL LOW (ref 36.0–46.0)
HDL: 41 mg/dL (ref >39)
Hemoglobin: 9.2 g/dL — ABNORMAL LOW (ref 12.0–15.0)
LDL Cholesterol: 44 mg/dL (ref 0–99)
Lymphocytes Relative: 28 % (ref 12–46)
Lymphs Abs: 2.2 10*3/uL (ref 0.7–4.0)
MCHC: 29.9 g/dL — ABNORMAL LOW (ref 30.0–36.0)
MCV: 74.9 fL — ABNORMAL LOW (ref 78.0–100.0)
Monocytes Absolute: 0.5 10*3/uL (ref 0.1–1.0)
Monocytes Relative: 6 % (ref 3–12)
Neutro Abs: 5.1 10*3/uL (ref 1.7–7.7)
Neutrophils Relative %: 64 % (ref 43–77)
Platelets: 300 10*3/uL (ref 150–400)
Potassium: 3.9 meq/L (ref 3.5–5.3)
RBC: 4.11 M/uL (ref 3.87–5.11)
RDW: 16.5 % — ABNORMAL HIGH (ref 11.5–15.5)
Sodium: 145 meq/L (ref 135–145)
Total Bilirubin: 0.3 mg/dL (ref 0.3–1.2)
Total CHOL/HDL Ratio: 2.5
Total Protein: 8 g/dL (ref 6.0–8.3)
Triglycerides: 85 mg/dL (ref <150)
VLDL: 17 mg/dL (ref 0–40)
WBC: 7.8 10*3/uL (ref 4.0–10.5)

## 2007-03-18 ENCOUNTER — Encounter: Payer: Self-pay | Admitting: Internal Medicine

## 2007-04-08 ENCOUNTER — Ambulatory Visit: Payer: Self-pay | Admitting: Internal Medicine

## 2007-04-15 LAB — CONVERTED CEMR LAB
BUN: 16 mg/dL (ref 6–23)
CO2: 26 meq/L (ref 19–32)
Calcium: 8.7 mg/dL (ref 8.4–10.5)
Chloride: 107 meq/L (ref 96–112)
Creatinine, Ser: 1.44 mg/dL — ABNORMAL HIGH (ref 0.40–1.20)
Glucose, Bld: 153 mg/dL — ABNORMAL HIGH (ref 70–99)
Potassium: 3.5 meq/L (ref 3.5–5.3)
Sodium: 150 meq/L — ABNORMAL HIGH (ref 135–145)

## 2007-04-19 ENCOUNTER — Ambulatory Visit: Payer: Self-pay | Admitting: Internal Medicine

## 2007-04-19 LAB — CONVERTED CEMR LAB
BUN: 16 mg/dL (ref 6–23)
CO2: 24 meq/L (ref 19–32)
Calcium: 9.1 mg/dL (ref 8.4–10.5)
Chloride: 104 meq/L (ref 96–112)
Creatinine, Ser: 1.23 mg/dL — ABNORMAL HIGH (ref 0.40–1.20)
Glucose, Bld: 149 mg/dL — ABNORMAL HIGH (ref 70–99)
Potassium: 4 meq/L (ref 3.5–5.3)
Sodium: 143 meq/L (ref 135–145)

## 2007-05-11 ENCOUNTER — Encounter: Payer: Self-pay | Admitting: Internal Medicine

## 2007-05-18 ENCOUNTER — Ambulatory Visit: Payer: Self-pay | Admitting: Internal Medicine

## 2007-05-18 LAB — CONVERTED CEMR LAB
Blood Glucose, Fingerstick: 163
Hgb A1c MFr Bld: 6 %

## 2007-05-23 ENCOUNTER — Telehealth: Payer: Self-pay | Admitting: *Deleted

## 2007-05-24 ENCOUNTER — Encounter: Payer: Self-pay | Admitting: Internal Medicine

## 2007-05-30 ENCOUNTER — Ambulatory Visit: Payer: Self-pay | Admitting: Internal Medicine

## 2007-05-30 LAB — CONVERTED CEMR LAB
OCCULT 2: NEGATIVE
OCCULT 3: NEGATIVE

## 2007-06-02 ENCOUNTER — Telehealth: Payer: Self-pay | Admitting: Internal Medicine

## 2007-06-08 LAB — CONVERTED CEMR LAB
BUN: 28 mg/dL — ABNORMAL HIGH (ref 6–23)
Basophils Absolute: 0 10*3/uL (ref 0.0–0.1)
Basophils Relative: 0 % (ref 0–1)
CO2: 24 meq/L (ref 19–32)
Calcium: 9.4 mg/dL (ref 8.4–10.5)
Chloride: 106 meq/L (ref 96–112)
Creatinine, Ser: 1.53 mg/dL — ABNORMAL HIGH (ref 0.40–1.20)
Creatinine, Urine: 39.9 mg/dL
Eosinophils Absolute: 0.2 10*3/uL (ref 0.0–0.7)
Eosinophils Relative: 2 % (ref 0–5)
Ferritin: 86 ng/mL (ref 10–291)
Glucose, Bld: 128 mg/dL — ABNORMAL HIGH (ref 70–99)
HCT: 32.5 % — ABNORMAL LOW (ref 36.0–46.0)
Hemoglobin: 9.6 g/dL — ABNORMAL LOW (ref 12.0–15.0)
Lymphocytes Relative: 22 % (ref 12–46)
Lymphs Abs: 2.3 10*3/uL (ref 0.7–4.0)
MCHC: 29.5 g/dL — ABNORMAL LOW (ref 30.0–36.0)
MCV: 73.9 fL — ABNORMAL LOW (ref 78.0–100.0)
Microalb Creat Ratio: 20.1 mg/g (ref 0.0–30.0)
Microalb, Ur: 0.8 mg/dL (ref 0.00–1.89)
Monocytes Absolute: 0.7 10*3/uL (ref 0.1–1.0)
Monocytes Relative: 6 % (ref 3–12)
Neutro Abs: 7.3 10*3/uL (ref 1.7–7.7)
Neutrophils Relative %: 70 % (ref 43–77)
Platelets: 381 10*3/uL (ref 150–400)
Potassium: 4.3 meq/L (ref 3.5–5.3)
RBC: 4.4 M/uL (ref 3.87–5.11)
RDW: 15.9 % — ABNORMAL HIGH (ref 11.5–15.5)
Sodium: 147 meq/L — ABNORMAL HIGH (ref 135–145)
WBC: 10.5 10*3/uL (ref 4.0–10.5)

## 2007-06-13 ENCOUNTER — Ambulatory Visit: Payer: Self-pay | Admitting: Internal Medicine

## 2007-06-13 LAB — CONVERTED CEMR LAB
Calcium: 8.1 mg/dL — ABNORMAL LOW (ref 8.4–10.5)
Chloride: 112 meq/L (ref 96–112)
Creatinine, Ser: 1.42 mg/dL — ABNORMAL HIGH (ref 0.40–1.20)
Sodium: 146 meq/L — ABNORMAL HIGH (ref 135–145)

## 2007-06-20 ENCOUNTER — Encounter: Payer: Self-pay | Admitting: Internal Medicine

## 2007-06-29 ENCOUNTER — Telehealth: Payer: Self-pay | Admitting: *Deleted

## 2007-08-04 ENCOUNTER — Telehealth: Payer: Self-pay | Admitting: Internal Medicine

## 2007-09-01 ENCOUNTER — Telehealth: Payer: Self-pay | Admitting: Internal Medicine

## 2007-09-09 ENCOUNTER — Telehealth (INDEPENDENT_AMBULATORY_CARE_PROVIDER_SITE_OTHER): Payer: Self-pay | Admitting: Pharmacy Technician

## 2007-09-21 ENCOUNTER — Ambulatory Visit: Payer: Self-pay | Admitting: Internal Medicine

## 2007-09-21 DIAGNOSIS — D179 Benign lipomatous neoplasm, unspecified: Secondary | ICD-10-CM | POA: Insufficient documentation

## 2007-09-21 LAB — CONVERTED CEMR LAB
Albumin: 4.3 g/dL (ref 3.5–5.2)
Alkaline Phosphatase: 58 units/L (ref 39–117)
Blood Glucose, Fingerstick: 157
CO2: 25 meq/L (ref 19–32)
Calcium: 9 mg/dL (ref 8.4–10.5)
Chloride: 106 meq/L (ref 96–112)
Glucose, Bld: 120 mg/dL — ABNORMAL HIGH (ref 70–99)
Lymphocytes Relative: 23 % (ref 12–46)
Lymphs Abs: 2.1 10*3/uL (ref 0.7–4.0)
Neutrophils Relative %: 68 % (ref 43–77)
Platelets: 315 10*3/uL (ref 150–400)
Potassium: 4.3 meq/L (ref 3.5–5.3)
RBC: 4.51 M/uL (ref 3.87–5.11)
Sodium: 145 meq/L (ref 135–145)
Total Protein: 8.1 g/dL (ref 6.0–8.3)
WBC: 9.2 10*3/uL (ref 4.0–10.5)

## 2007-09-23 ENCOUNTER — Encounter: Payer: Self-pay | Admitting: Internal Medicine

## 2007-09-23 DIAGNOSIS — Z86000 Personal history of in-situ neoplasm of breast: Secondary | ICD-10-CM

## 2007-09-23 DIAGNOSIS — Z90710 Acquired absence of both cervix and uterus: Secondary | ICD-10-CM | POA: Insufficient documentation

## 2007-09-28 ENCOUNTER — Encounter: Payer: Self-pay | Admitting: Internal Medicine

## 2007-11-07 ENCOUNTER — Telehealth: Payer: Self-pay | Admitting: Internal Medicine

## 2007-11-11 ENCOUNTER — Encounter: Payer: Self-pay | Admitting: Internal Medicine

## 2007-12-07 ENCOUNTER — Encounter: Payer: Self-pay | Admitting: Internal Medicine

## 2007-12-14 ENCOUNTER — Ambulatory Visit: Payer: Self-pay | Admitting: Internal Medicine

## 2007-12-14 DIAGNOSIS — E1142 Type 2 diabetes mellitus with diabetic polyneuropathy: Secondary | ICD-10-CM | POA: Insufficient documentation

## 2007-12-14 LAB — CONVERTED CEMR LAB
Blood Glucose, Fingerstick: 173
Hgb A1c MFr Bld: 6.9 %

## 2007-12-15 DIAGNOSIS — N185 Chronic kidney disease, stage 5: Secondary | ICD-10-CM

## 2007-12-15 DIAGNOSIS — E1122 Type 2 diabetes mellitus with diabetic chronic kidney disease: Secondary | ICD-10-CM | POA: Insufficient documentation

## 2007-12-15 LAB — CONVERTED CEMR LAB
ALT: 14 units/L (ref 0–35)
Alkaline Phosphatase: 64 units/L (ref 39–117)
Basophils Absolute: 0 10*3/uL (ref 0.0–0.1)
CO2: 23 meq/L (ref 19–32)
Eosinophils Relative: 2 % (ref 0–5)
HCT: 33.5 % — ABNORMAL LOW (ref 36.0–46.0)
Lymphocytes Relative: 29 % (ref 12–46)
Platelets: 327 10*3/uL (ref 150–400)
RDW: 16.1 % — ABNORMAL HIGH (ref 11.5–15.5)
Sodium: 144 meq/L (ref 135–145)
Total Bilirubin: 0.3 mg/dL (ref 0.3–1.2)
Total Protein: 8.1 g/dL (ref 6.0–8.3)

## 2007-12-28 ENCOUNTER — Telehealth (INDEPENDENT_AMBULATORY_CARE_PROVIDER_SITE_OTHER): Payer: Self-pay | Admitting: *Deleted

## 2008-01-04 ENCOUNTER — Telehealth: Payer: Self-pay | Admitting: Internal Medicine

## 2008-01-10 ENCOUNTER — Ambulatory Visit: Payer: Self-pay | Admitting: Internal Medicine

## 2008-01-10 LAB — CONVERTED CEMR LAB
BUN: 21 mg/dL (ref 6–23)
Calcium: 8.9 mg/dL (ref 8.4–10.5)
Creatinine, Ser: 1.46 mg/dL — ABNORMAL HIGH (ref 0.40–1.20)
Glucose, Bld: 293 mg/dL — ABNORMAL HIGH (ref 70–99)
Sodium: 144 meq/L (ref 135–145)

## 2008-02-07 ENCOUNTER — Encounter: Payer: Self-pay | Admitting: Internal Medicine

## 2008-02-07 ENCOUNTER — Telehealth: Payer: Self-pay | Admitting: *Deleted

## 2008-02-15 ENCOUNTER — Encounter: Payer: Self-pay | Admitting: Internal Medicine

## 2008-03-06 ENCOUNTER — Telehealth: Payer: Self-pay | Admitting: Internal Medicine

## 2008-03-07 ENCOUNTER — Telehealth: Payer: Self-pay | Admitting: Internal Medicine

## 2008-03-23 ENCOUNTER — Encounter: Payer: Self-pay | Admitting: Internal Medicine

## 2008-04-18 ENCOUNTER — Ambulatory Visit: Payer: Self-pay | Admitting: Internal Medicine

## 2008-04-18 DIAGNOSIS — K029 Dental caries, unspecified: Secondary | ICD-10-CM | POA: Insufficient documentation

## 2008-04-18 LAB — CONVERTED CEMR LAB: Hgb A1c MFr Bld: 6.6 %

## 2008-04-27 ENCOUNTER — Encounter: Payer: Self-pay | Admitting: Internal Medicine

## 2008-04-27 ENCOUNTER — Ambulatory Visit: Payer: Self-pay | Admitting: Internal Medicine

## 2008-04-27 LAB — CONVERTED CEMR LAB
Albumin: 4 g/dL (ref 3.5–5.2)
BUN: 19 mg/dL (ref 6–23)
Basophils Absolute: 0 10*3/uL (ref 0.0–0.1)
CO2: 23 meq/L (ref 19–32)
Calcium: 9.1 mg/dL (ref 8.4–10.5)
Chloride: 106 meq/L (ref 96–112)
Eosinophils Absolute: 0.2 10*3/uL (ref 0.0–0.7)
Ferritin: 89 ng/mL (ref 10–291)
GFR calc non Af Amer: 38 mL/min — ABNORMAL LOW (ref >60)
Glucose, Bld: 159 mg/dL — ABNORMAL HIGH (ref 70–99)
HDL: 41 mg/dL (ref >39)
Lymphocytes Relative: 29 % (ref 12–46)
Lymphs Abs: 2.1 10*3/uL (ref 0.7–4.0)
MCV: 73 fL — ABNORMAL LOW (ref 78.0–100.0)
Neutrophils Relative %: 61 % (ref 43–77)
Platelets: 303 10*3/uL (ref 150–400)
Potassium: 4.4 meq/L (ref 3.5–5.3)
RDW: 15.9 % — ABNORMAL HIGH (ref 11.5–15.5)
WBC: 7.2 10*3/uL (ref 4.0–10.5)

## 2008-06-25 ENCOUNTER — Encounter: Payer: Self-pay | Admitting: Licensed Clinical Social Worker

## 2008-07-25 ENCOUNTER — Telehealth: Payer: Self-pay | Admitting: Internal Medicine

## 2008-08-15 ENCOUNTER — Ambulatory Visit: Payer: Self-pay | Admitting: Internal Medicine

## 2008-08-15 DIAGNOSIS — M25562 Pain in left knee: Secondary | ICD-10-CM

## 2008-08-15 DIAGNOSIS — G8929 Other chronic pain: Secondary | ICD-10-CM

## 2008-08-15 DIAGNOSIS — M25561 Pain in right knee: Secondary | ICD-10-CM

## 2008-08-15 LAB — CONVERTED CEMR LAB
Blood Glucose, Fingerstick: 104
Calcium: 9 mg/dL (ref 8.4–10.5)
Creatinine, Urine: 44.1 mg/dL
Glucose, Bld: 106 mg/dL — ABNORMAL HIGH (ref 70–99)
Lymphocytes Relative: 33 % (ref 12–46)
Lymphs Abs: 2.9 10*3/uL (ref 0.7–4.0)
MCV: 73 fL — ABNORMAL LOW (ref 78.0–100.0)
Microalb Creat Ratio: 31.5 mg/g — ABNORMAL HIGH (ref 0.0–30.0)
Microalb, Ur: 1.39 mg/dL (ref 0.00–1.89)
Monocytes Relative: 8 % (ref 3–12)
Neutro Abs: 5 10*3/uL (ref 1.7–7.7)
Neutrophils Relative %: 57 % (ref 43–77)
Platelets: 288 10*3/uL (ref 150–400)
Potassium: 4.1 meq/L (ref 3.5–5.3)
RBC: 4.04 M/uL (ref 3.87–5.11)
Sodium: 146 meq/L — ABNORMAL HIGH (ref 135–145)
WBC: 8.7 10*3/uL (ref 4.0–10.5)

## 2008-08-16 ENCOUNTER — Encounter: Payer: Self-pay | Admitting: Internal Medicine

## 2008-08-27 ENCOUNTER — Telehealth: Payer: Self-pay | Admitting: Licensed Clinical Social Worker

## 2008-11-07 ENCOUNTER — Encounter: Admission: RE | Admit: 2008-11-07 | Discharge: 2008-11-07 | Payer: Self-pay | Admitting: Internal Medicine

## 2008-11-14 ENCOUNTER — Ambulatory Visit: Payer: Self-pay | Admitting: Internal Medicine

## 2008-11-14 LAB — CONVERTED CEMR LAB
ALT: 10 units/L (ref 0–35)
AST: 16 units/L (ref 0–37)
Blood Glucose, Fingerstick: 183
CO2: 23 meq/L (ref 19–32)
Chloride: 106 meq/L (ref 96–112)
Creatinine, Ser: 1.5 mg/dL — ABNORMAL HIGH (ref 0.40–1.20)
Eosinophils Absolute: 0.2 10*3/uL (ref 0.0–0.7)
Ferritin: 106 ng/mL (ref 10–291)
Hgb A1c MFr Bld: 6.7 %
Lymphs Abs: 2.2 10*3/uL (ref 0.7–4.0)
Monocytes Relative: 7 % (ref 3–12)
Neutro Abs: 5.2 10*3/uL (ref 1.7–7.7)
Neutrophils Relative %: 64 % (ref 43–77)
Platelets: 321 10*3/uL (ref 150–400)
RBC: 4.6 M/uL (ref 3.87–5.11)
Sodium: 146 meq/L — ABNORMAL HIGH (ref 135–145)
Total Bilirubin: 0.3 mg/dL (ref 0.3–1.2)
Total Protein: 7.8 g/dL (ref 6.0–8.3)
WBC: 8.2 10*3/uL (ref 4.0–10.5)

## 2008-11-30 ENCOUNTER — Encounter: Payer: Self-pay | Admitting: Internal Medicine

## 2009-01-24 ENCOUNTER — Telehealth: Payer: Self-pay | Admitting: Internal Medicine

## 2009-02-12 ENCOUNTER — Telehealth (INDEPENDENT_AMBULATORY_CARE_PROVIDER_SITE_OTHER): Payer: Self-pay | Admitting: *Deleted

## 2009-02-27 ENCOUNTER — Ambulatory Visit: Payer: Self-pay | Admitting: Internal Medicine

## 2009-03-06 ENCOUNTER — Encounter: Payer: Self-pay | Admitting: Internal Medicine

## 2009-03-12 ENCOUNTER — Ambulatory Visit: Payer: Self-pay | Admitting: Internal Medicine

## 2009-03-12 LAB — CONVERTED CEMR LAB
OCCULT 1: NEGATIVE
OCCULT 2: NEGATIVE

## 2009-03-27 ENCOUNTER — Encounter: Payer: Self-pay | Admitting: Internal Medicine

## 2009-05-01 ENCOUNTER — Ambulatory Visit: Payer: Self-pay | Admitting: Internal Medicine

## 2009-05-01 LAB — CONVERTED CEMR LAB
ALT: 10 units/L (ref 0–35)
AST: 14 units/L (ref 0–37)
Albumin: 4 g/dL (ref 3.5–5.2)
Alkaline Phosphatase: 49 units/L (ref 39–117)
BUN: 23 mg/dL (ref 6–23)
Basophils Absolute: 0 10*3/uL (ref 0.0–0.1)
Blood Glucose, AC Bkfst: 150 mg/dL
Chloride: 106 meq/L (ref 96–112)
HDL: 35 mg/dL — ABNORMAL LOW (ref >39)
Hgb A1c MFr Bld: 6.7 %
LDL Cholesterol: 39 mg/dL (ref 0–99)
Lymphocytes Relative: 21 % (ref 12–46)
Lymphs Abs: 2.2 10*3/uL (ref 0.7–4.0)
Neutro Abs: 7.3 10*3/uL (ref 1.7–7.7)
Neutrophils Relative %: 70 % (ref 43–77)
Platelets: 324 10*3/uL (ref 150–400)
Potassium: 4 meq/L (ref 3.5–5.3)
RBC Folate: 1561 ng/mL — ABNORMAL HIGH (ref 180–600)
RDW: 14.9 % (ref 11.5–15.5)
Sodium: 143 meq/L (ref 135–145)
Total Protein: 7.6 g/dL (ref 6.0–8.3)
Vitamin B-12: 739 pg/mL (ref 211–911)
WBC: 10.3 10*3/uL (ref 4.0–10.5)

## 2009-05-03 ENCOUNTER — Telehealth: Payer: Self-pay | Admitting: *Deleted

## 2009-05-08 ENCOUNTER — Encounter: Payer: Self-pay | Admitting: Internal Medicine

## 2009-05-09 ENCOUNTER — Encounter: Payer: Self-pay | Admitting: Internal Medicine

## 2009-05-15 ENCOUNTER — Encounter: Payer: Self-pay | Admitting: Internal Medicine

## 2009-05-17 ENCOUNTER — Encounter: Payer: Self-pay | Admitting: Licensed Clinical Social Worker

## 2009-05-23 ENCOUNTER — Encounter: Payer: Self-pay | Admitting: Internal Medicine

## 2009-06-11 ENCOUNTER — Telehealth (INDEPENDENT_AMBULATORY_CARE_PROVIDER_SITE_OTHER): Payer: Self-pay | Admitting: *Deleted

## 2009-06-13 ENCOUNTER — Encounter: Payer: Self-pay | Admitting: Internal Medicine

## 2009-06-14 ENCOUNTER — Ambulatory Visit: Payer: Self-pay | Admitting: Internal Medicine

## 2009-06-14 ENCOUNTER — Encounter: Payer: Self-pay | Admitting: Internal Medicine

## 2009-10-01 ENCOUNTER — Telehealth: Payer: Self-pay | Admitting: *Deleted

## 2009-10-11 ENCOUNTER — Encounter: Payer: Self-pay | Admitting: Internal Medicine

## 2009-11-26 ENCOUNTER — Ambulatory Visit: Payer: Self-pay | Admitting: Internal Medicine

## 2009-11-26 LAB — CONVERTED CEMR LAB
Basophils Absolute: 0 10*3/uL (ref 0.0–0.1)
CO2: 24 meq/L (ref 19–32)
Creatinine, Ser: 1.57 mg/dL — ABNORMAL HIGH (ref 0.40–1.20)
Eosinophils Relative: 2 % (ref 0–5)
Ferritin: 114 ng/mL (ref 10–291)
Glucose, Bld: 154 mg/dL — ABNORMAL HIGH (ref 70–99)
HCT: 30.5 % — ABNORMAL LOW (ref 36.0–46.0)
Hemoglobin: 9.2 g/dL — ABNORMAL LOW (ref 12.0–15.0)
Hgb A1c MFr Bld: 6.7 %
Iron: 27 ug/dL — ABNORMAL LOW (ref 42–145)
Lymphocytes Relative: 24 % (ref 12–46)
Lymphs Abs: 2.4 10*3/uL (ref 0.7–4.0)
MCV: 73.7 fL — ABNORMAL LOW (ref >=78.0)
Microalb Creat Ratio: 133.5 mg/g — ABNORMAL HIGH (ref 0.0–30.0)
Monocytes Absolute: 0.7 10*3/uL (ref 0.1–1.0)
RDW: 15.8 % — ABNORMAL HIGH (ref 11.5–15.5)
Saturation Ratios: 10 % — ABNORMAL LOW (ref 20–55)
TIBC: 273 ug/dL (ref 250–470)
Total Bilirubin: 0.2 mg/dL — ABNORMAL LOW (ref 0.3–1.2)
UIBC: 246 ug/dL

## 2009-12-18 ENCOUNTER — Encounter: Payer: Self-pay | Admitting: Internal Medicine

## 2009-12-18 ENCOUNTER — Encounter: Payer: Self-pay | Admitting: Licensed Clinical Social Worker

## 2009-12-18 ENCOUNTER — Telehealth: Payer: Self-pay | Admitting: Licensed Clinical Social Worker

## 2009-12-18 DIAGNOSIS — H5310 Unspecified subjective visual disturbances: Secondary | ICD-10-CM | POA: Insufficient documentation

## 2009-12-25 ENCOUNTER — Encounter: Payer: Self-pay | Admitting: Internal Medicine

## 2010-01-06 ENCOUNTER — Encounter: Payer: Self-pay | Admitting: Internal Medicine

## 2010-01-06 LAB — HM DIABETES EYE EXAM

## 2010-01-17 ENCOUNTER — Encounter
Admission: RE | Admit: 2010-01-17 | Discharge: 2010-01-17 | Payer: Self-pay | Source: Home / Self Care | Attending: Internal Medicine | Admitting: Internal Medicine

## 2010-01-17 LAB — HM MAMMOGRAPHY: HM Mammogram: NEGATIVE

## 2010-02-23 ENCOUNTER — Encounter: Payer: Self-pay | Admitting: Internal Medicine

## 2010-03-04 NOTE — Progress Notes (Signed)
Summary: diabetes support/dmr  Phone Note Call from Patient Call back at Home Phone [phone]   Details for Reason: talk to cde about diabetes machine Summary of Call: needs help setting her meter- appointment scheduled with CDE for 5.13.11 Initial call taken by: Jamison Neighbor RD,CDE,  Jun 11, 2009 9:39 AM

## 2010-03-04 NOTE — Letter (Signed)
Summary: [facility]   Imported By: Margie Billet 05/15/2009 13:40:27  _____________________________________________________________________  External Attachment:    Type:   Image     Comment:   External Document

## 2010-03-04 NOTE — Letter (Signed)
Summary: [facility]   Imported By: Shon Hough 12/31/2009 11:51:02  _____________________________________________________________________  External Attachment:    Type:   Image     Comment:   External Document

## 2010-03-04 NOTE — Miscellaneous (Signed)
Summary: [facility]   Imported By: Margie Billet 05/28/2009 12:02:02  _____________________________________________________________________  External Attachment:    Type:   Image     Comment:   External Document

## 2010-03-04 NOTE — Miscellaneous (Signed)
Summary: [facility]  [facility]   Imported By: Florinda Marker 04/02/2009 10:19:46  _____________________________________________________________________  External Attachment:    Type:   Image     Comment:   External Document

## 2010-03-04 NOTE — Progress Notes (Signed)
Summary: Refill Request: Tylenol #3  Phone Note Refill Request Message from:  Pharmacy  Refills Requested: Medication #1:  TYLENOL/CODEINE #3 300-30 MG TABS Take 1 tablet by mouth three times a day as needed for pain   Last Refilled: 12/21/2008 Electronic refill request received.  Could not prescribe controlled substance electronically.   Method Requested: Telephone to Pharmacy Initial call taken by: Merrie Roof RN,  May 03, 2009 2:09 PM  Follow-up for Phone Call        Refill approved-nurse to complete. Follow-up by: Margarito Liner MD,  May 03, 2009 1:24 PM  Additional Follow-up for Phone Call Additional follow up Details #1::        Rx called to pharmacy Additional Follow-up by: Merrie Roof RN,  May 03, 2009 2:10 PM    Prescriptions: TYLENOL/CODEINE #3 300-30 MG TABS (ACETAMINOPHEN-CODEINE) Take 1 tablet by mouth three times a day as needed for pain  #60 x 2   Entered and Authorized by:   Margarito Liner MD   Signed by:   Margarito Liner MD on 05/03/2009   Method used:   Telephoned to ...       [facility] pharmacist (retail)       [address]       [place], [place]  [address]       Ph: [phone]       Fax: [phone]   RxID:   [id number]   Appended Document: Refill Request: Tylenol #3 Rx called into [facility]/cancelled rx at [facility]

## 2010-03-04 NOTE — Assessment & Plan Note (Signed)
Summary: FU VISIT/DS   Vital Signs:  Patient profile:   65 year old female Height:      65 inches Weight:      253.6 pounds BMI:     42.35 Temp:     97.8 degrees F oral Pulse rate:   84 / minute BP sitting:   159 / 76  (right arm)  Vitals Entered By: Filomena Jungling NT II (November 26, 2009 2:33 PM) CC: CHECKUP/ NEED FLU SHOT/ EYE EXAM ALREDAY FOR NOVEMBER Is Patient Diabetic? Yes Did you bring your meter with you today? No Pain Assessment Patient in pain? no      Nutritional Status BMI of > 30 = obese CBG Result 209  Have you ever been in a relationship where you felt threatened, hurt or afraid?No   Does patient need assistance? Functional Status Self care Ambulation Normal    Primary Care Shandie Bertz:  Margarito Liner MD  CC:  CHECKUP/ NEED FLU SHOT/ EYE EXAM ALREDAY FOR NOVEMBER.  History of Present Illness: Patient returns today for follow-up of her diabetes mellitus, hyperlipidemia, hypertension, COPD, obstructive sleep apnea, and other chronic medical problems.  She has no acute complaints today.  She reports that she is compliant with her medications.  She is using her CPAP without problems; she denies daytime somnolence or morning headaches.  Preventive Screening-Counseling & Management  Alcohol-Tobacco     Alcohol drinks/day: 0     Smoking Status: quit     Year Quit: 2002  Caffeine-Diet-Exercise     Does Patient Exercise: yes     Type of exercise: WALKING     Times/week: 5  Current Medications (verified): 1)  Glucotrol Xl 5 Mg Tb24 (Glipizide) .... Take 1 Tablet By Mouth Once A Day 2)  Metformin Hcl 500 Mg Tabs (Metformin Hcl) .... Take 1 Tablet By Mouth Two Times A Day 3)  Furosemide 40 Mg Tabs (Furosemide) .... Take 1 Tablet By Mouth Once A Day 4)  Nasonex 50 Mcg/act Susp (Mometasone Furoate) .... Spray 2 Sprays in Each Nostril Once A Day 5)  Atenolol 25 Mg Tabs (Atenolol) .... Take 2 Tablets By Mouth Once A Day 6)  Combivent 103-18 Mcg/act Aero  (Albuterol-Ipratropium) .... Inhale 2 Puffs Four Times A Day 7)  Omeprazole 20 Mg Cpdr (Omeprazole) .... Take 2 Tablets By Mouth Once A Day 8)  Tylenol/codeine #3 300-30 Mg Tabs (Acetaminophen-Codeine) .... Take 1 Tablet By Mouth Three Times A Day As Needed For Pain 9)  Simvastatin 20 Mg  Tabs (Simvastatin) .... Take 1 Tablet By Mouth Once A Day 10)  Enalapril Maleate 10 Mg  Tabs (Enalapril Maleate) .... Take 2 Tablets By Mouth Each Am and 2 Tablets Each Pm 11)  Norvasc 10 Mg Tabs (Amlodipine Besylate) .... Take 1 Tablet By Mouth Once A Day 12)  Ra Central-Vite Senior  Tabs (Multiple Vitamins-Minerals) .... Take 1 Tablet By Mouth Once A Day 13)  Xalatan 0.005 % Soln (Latanoprost) .... Take As Directed By Ophthalmologist 14)  Duoneb 0.5-2.5 (3) Mg/65ml  Soln (Ipratropium-Albuterol) .... Use Four Times A Day Per Nebulizer For Shortness of Breath. Diagnosis: Copd (Icd-496) 15)  Bd Ultra-Fine Lancets  Misc (Lancets) .... Once Daily Testing 16)  Aspirin 81 Mg  Tbec (Aspirin) .... Take 1 Tablet By Mouth Once A Day 17)  Clonidine Hcl 0.1 Mg  Tabs (Clonidine Hcl) .... Take 3 Tablets By Mouth Two Times A Day 18)  Prodigy Voice Blood Glucose W/device Kit (Blood Glucose Monitoring Suppl) .... Use To  Test Your Blood Sugar 19)  Prodigy Voice Blood Glucose  Strp (Glucose Blood) .... Use To Test Blood Sugar Daily 20)  Prodigy Voice Blood Glucose W/device Kit (Blood Glucose Monitoring Suppl) .... Use To Check Blood Sugar 1x- 2x/day 21)  Cetirizine Hcl 10 Mg Tabs (Cetirizine Hcl) .... Take 1 Tablet By Mouth Once A Day 22)  Aspirin Ec 81 Mg Tbec (Aspirin) .... Take 1 Tablet By Mouth Once A Day 23)  Ferrous Sulfate 325 (65 Fe) Mg Tabs (Ferrous Sulfate) .... Take 1 Tablet By Mouth Once A Day  Allergies (verified): No Known Drug Allergies  Review of Systems General:  Denies chills, fever, and sweats. CV:  Denies chest pain or discomfort. Resp:  Denies shortness of breath and wheezing; recent mild cold symptoms  . GI:  Denies abdominal pain, nausea, and vomiting. GU:  Denies dysuria. MS:  Denies muscle aches and cramps.  Physical Exam  General:  alert, no distress Lungs:  normal respiratory effort, normal breath sounds, no crackles, and no wheezes.   Heart:  normal rate, regular rhythm, Grade 2/6 systolic murmur; no S3, no S4 Abdomen:  soft, non-tender, normal bowel sounds, no hepatomegaly, and no splenomegaly.   Extremities:  no edema  Diabetes Management Exam:    Foot Exam (with socks and/or shoes not present):       Sensory-Monofilament:          Left foot: normal          Right foot: normal   Impression & Recommendations:  Problem # 1:  DIABETES MELLITUS, TYPE II (ICD-250.00) Patient's hemoglobin A1c is at goal on current regimen. Plan is to continue current medications and home glucose monitoring.  Her updated medication list for this problem includes:    Glucotrol Xl 5 Mg Tb24 (Glipizide) .Marland Kitchen... Take 1 tablet by mouth once a day    Metformin Hcl 500 Mg Tabs (Metformin hcl) .Marland Kitchen... Take 1 tablet by mouth two times a day    Enalapril Maleate 10 Mg Tabs (Enalapril maleate) .Marland Kitchen... Take 2 tablets by mouth each am and 2 tablets each pm    Aspirin 81 Mg Tbec (Aspirin) .Marland Kitchen... Take 1 tablet by mouth once a day    Aspirin Ec 81 Mg Tbec (Aspirin) .Marland Kitchen... Take 1 tablet by mouth once a day  Labs Reviewed: Creat: 1.50 (05/01/2009)     Last Eye Exam: See report. [facility] (09/28/2007) Reviewed HgBA1c results: 6.7 (11/26/2009)  6.7 (05/01/2009)  Orders: T-Hgb A1C (in-house) (16109UE) T- Capillary Blood Glucose (45409)  Problem # 2:  HYPERTENSION (ICD-401.9) Patient's systolic blood pressure is above goal; the plan is to increase atenolol to a dose of 50 mg each a.m. and 25 mg each p.m., and continue other medications as before.  Her updated medication list for this problem includes:    Furosemide 40 Mg Tabs (Furosemide) .Marland Kitchen... Take 1 tablet by mouth once a day     Atenolol 25 Mg Tabs (Atenolol) .Marland Kitchen... Take 2 tablets by mouth each morning and 1 tablet each evening.    Enalapril Maleate 10 Mg Tabs (Enalapril maleate) .Marland Kitchen... Take 2 tablets by mouth each am and 2 tablets each pm    Norvasc 10 Mg Tabs (Amlodipine besylate) .Marland Kitchen... Take 1 tablet by mouth once a day    Clonidine Hcl 0.1 Mg Tabs (Clonidine hcl) .Marland Kitchen... Take 3 tablets by mouth two times a day  BP today: 159/76 Prior BP: 128/71 (05/01/2009)  Labs Reviewed: K+: 4.0 (05/01/2009) Creat: : 1.50 (05/01/2009)  Chol: 91 (05/01/2009)   HDL: 35 (05/01/2009)   LDL: 39 (05/01/2009)   TG: 83 (05/01/2009)  Problem # 3:  HYPERLIPIDEMIA (ICD-272.4) Patient's LDL is at goal on current dose of simvastatin. Patient has no apparent side effects of this medication. The plan is to continue simvastatin at current dose.  Labs Reviewed: SGOT: 14 (05/01/2009)   SGPT: 10 (05/01/2009)   HDL:35 (05/01/2009), 41 (04/27/2008)  LDL:39 (05/01/2009), 37 (04/27/2008)  Chol:91 (05/01/2009), 96 (04/27/2008)  Trig:83 (05/01/2009), 92 (04/27/2008)  Her updated medication list for this problem includes:    Simvastatin 20 Mg Tabs (Simvastatin) .Marland Kitchen... Take 1 tablet by mouth once a day  Problem # 4:  COPD (ICD-496) Patient is doing well on current regimen.  Her updated medication list for this problem includes:    Combivent 103-18 Mcg/act Aero (Albuterol-ipratropium) ..... Inhale 2 puffs four times a day    Duoneb 0.5-2.5 (3) Mg/61ml Soln (Ipratropium-albuterol) ..... Use four times a day per nebulizer for shortness of breath. diagnosis: copd (icd-496)  Problem # 5:  ANEMIA-IRON DEFICIENCY (ICD-280.9) Patient has no symptoms of anemia. Plan is to check labs as below.  Her updated medication list for this problem includes:    Ferrous Sulfate 325 (65 Fe) Mg Tabs (Ferrous sulfate) .Marland Kitchen... Take 1 tablet by mouth once a day  Hgb: 9.3 (05/01/2009)   Hct: 29.8 (05/01/2009)   Platelets: 324 (05/01/2009) RBC: 4.05 (05/01/2009)   RDW: 14.9  (05/01/2009)   WBC: 10.3 (05/01/2009) MCV: 73.4 (05/01/2009)   MCHC: 31.3 (05/01/2009) Ferritin: 106 (11/14/2008) Iron: 31 (04/27/2008)   TIBC: 250 (04/27/2008)   % Sat: 12 (04/27/2008) B12: 739 (05/01/2009)   Folate: 1561 (05/01/2009)     Orders: T-CBC w/Diff (16109-60454) T-Ferritin (09811-91478) T-Iron (29562-13086) T-Iron Binding Capacity (TIBC) ([address])  Problem # 6:  SLEEP APNEA (ICD-780.57) Patient is doing well on CPAP.  Complete Medication List: 1)  Glucotrol Xl 5 Mg Tb24 (Glipizide) .... Take 1 tablet by mouth once a day 2)  Metformin Hcl 500 Mg Tabs (Metformin hcl) .... Take 1 tablet by mouth two times a day 3)  Furosemide 40 Mg Tabs (Furosemide) .... Take 1 tablet by mouth once a day 4)  Nasonex 50 Mcg/act Susp (Mometasone furoate) .... Spray 2 sprays in each nostril once a day 5)  Atenolol 25 Mg Tabs (Atenolol) .... Take 2 tablets by mouth each morning and 1 tablet each evening. 6)  Combivent 103-18 Mcg/act Aero (Albuterol-ipratropium) .... Inhale 2 puffs four times a day 7)  Omeprazole 20 Mg Cpdr (Omeprazole) .... Take 2 tablets by mouth once a day 8)  Tylenol/codeine #3 300-30 Mg Tabs (Acetaminophen-codeine) .... Take 1 tablet by mouth three times a day as needed for pain 9)  Simvastatin 20 Mg Tabs (Simvastatin) .... Take 1 tablet by mouth once a day 10)  Enalapril Maleate 10 Mg Tabs (Enalapril maleate) .... Take 2 tablets by mouth each am and 2 tablets each pm 11)  Norvasc 10 Mg Tabs (Amlodipine besylate) .... Take 1 tablet by mouth once a day 12)  Ra Central-vite Senior Tabs (Multiple vitamins-minerals) .... Take 1 tablet by mouth once a day 13)  Xalatan 0.005 % Soln (Latanoprost) .... Take as directed by ophthalmologist 14)  Duoneb 0.5-2.5 (3) Mg/19ml Soln (Ipratropium-albuterol) .... Use four times a day per nebulizer for shortness of breath. diagnosis: copd (icd-496) 15)  Bd Ultra-fine Lancets Misc (Lancets) .... Once daily testing 16)  Aspirin 81 Mg Tbec  (Aspirin) .... Take 1 tablet by mouth once a day  17)  Clonidine Hcl 0.1 Mg Tabs (Clonidine hcl) .... Take 3 tablets by mouth two times a day 18)  Prodigy Voice Blood Glucose W/device Kit (Blood glucose monitoring suppl) .... Use to test your blood sugar 19)  Prodigy Voice Blood Glucose Strp (Glucose blood) .... Use to test blood sugar daily 20)  Prodigy Voice Blood Glucose W/device Kit (Blood glucose monitoring suppl) .... Use to check blood sugar 1x- 2x/day 21)  Cetirizine Hcl 10 Mg Tabs (Cetirizine hcl) .... Take 1 tablet by mouth once a day 22)  Aspirin Ec 81 Mg Tbec (Aspirin) .... Take 1 tablet by mouth once a day 23)  Ferrous Sulfate 325 (65 Fe) Mg Tabs (Ferrous sulfate) .... Take 1 tablet by mouth once a day  Other Orders: T-Urine Microalbumin w/creat. ratio [id number]) T-Comprehensive Metabolic Panel ([id number]) Influenza Vaccine MCR (21308)  Patient Instructions: 1)  Please schedule a follow-up appointment in 3 months. 2)  Increase atenolol 25 mg to a dose of 2 tablets each morning and 1 tablet each evening. 3)  Please schedule screening mammogram.  Prescriptions: TYLENOL/CODEINE #3 300-30 MG TABS (ACETAMINOPHEN-CODEINE) Take 1 tablet by mouth three times a day as needed for pain  #60 x 2   Entered and Authorized by:   Margarito Liner MD   Signed by:   Margarito Liner MD on 11/26/2009   Method used:   Print then Give to Patient   RxID:   [id number] CLONIDINE HCL 0.1 MG  TABS (CLONIDINE HCL) Take 3 tablets by mouth two times a day  #180 x 6   Entered and Authorized by:   Margarito Liner MD   Signed by:   Margarito Liner MD on 11/26/2009   Method used:   Electronically to        [facility] (retail)       [address]       [place], [place]  [id number]       Ph: [phone]       Fax: [phone]   RxID:   [id number] ENALAPRIL MALEATE 10 MG  TABS (ENALAPRIL MALEATE) Take 2 tablets by mouth each AM and 2 tablets each PM  #120 x 5   Entered  and Authorized by:   Margarito Liner MD   Signed by:   Margarito Liner MD on 11/26/2009   Method used:   Electronically to        [facility] (retail)       [address]       [place], [place]  [address]       Ph: [phone]       Fax: [phone]   RxID:   [id number] SIMVASTATIN 20 MG  TABS (SIMVASTATIN) Take 1 tablet by mouth once a day  #31 Tablet x 5   Entered and Authorized by:   Margarito Liner MD   Signed by:   Margarito Liner MD on 11/26/2009   Method used:   Electronically to        [facility] (retail)       [address]       [place], [place]  [address]       Ph: [phone]       Fax: [phone]   RxID:   [id number] OMEPRAZOLE 20 MG CPDR (OMEPRAZOLE) Take 2 tablets by mouth once a day  #60 x 11   Entered and Authorized by:   Margarito Liner MD   Signed by:  Margarito Liner MD on 11/26/2009   Method used:   Electronically to        [facility] (retail)       [address]       [place], [place]  [address]       Ph: [phone]       Fax: [phone]   RxID:   [id number] COMBIVENT 103-18 MCG/ACT AERO (ALBUTEROL-IPRATROPIUM) Inhale 2 puffs four times a day  #1 x 11   Entered and Authorized by:   Margarito Liner MD   Signed by:   Margarito Liner MD on 11/26/2009   Method used:   Electronically to        [facility] (retail)       [address]       [place], [place]  [id number]       Ph: [phone]       Fax: [phone]   RxID:   [id number] ATENOLOL 25 MG TABS (ATENOLOL) Take 2 tablets by mouth each morning and 1 tablet each evening.  #90 x 5   Entered and Authorized by:   Margarito Liner MD   Signed by:   Margarito Liner MD on 11/26/2009   Method used:   Electronically to        [facility] (retail)       [address]       [place], [place]  [id number]       Ph: [phone]       Fax: [phone]   RxID:   [id number] NASONEX  50 MCG/ACT SUSP (MOMETASONE FUROATE) Spray 2 sprays in each nostril once a day  #1 x 11   Entered and Authorized by:   Margarito Liner MD   Signed by:   Margarito Liner MD on 11/26/2009   Method used:   Electronically to        [facility] (retail)       [address]       [place], [place]  [address]       Ph: [phone]       Fax: [phone]   RxID:   [id number] METFORMIN HCL 500 MG TABS (METFORMIN HCL) Take 1 tablet by mouth two times a day  #62 Tablet x 5   Entered and Authorized by:   Margarito Liner MD   Signed by:   Margarito Liner MD on 11/26/2009   Method used:   Electronically to        [facility] (retail)       [address]       [place], [place]  [address]       Ph: [phone]       Fax: [phone]   RxID:   [id number] GLUCOTROL XL 5 MG TB24 (GLIPIZIDE) Take 1 tablet by mouth once a day  #31 x 11   Entered and Authorized by:   Margarito Liner MD   Signed by:   Margarito Liner MD on 11/26/2009   Method used:   Electronically to        [facility] (retail)       [address]       [place], [place]  [address]       Ph: [phone]       Fax: [phone]   RxID:   [id number]    Orders Added: 1)  T-Hgb A1C (in-house) [89211HE]  2)  T-Urine Microalbumin w/creat. ratio [82043-[address]] 3)  T-Comprehensive Metabolic Panel [80053-22900] 4)  T-CBC w/Diff [32355-73220] 5)  T-Ferritin [82728-23350] 6)  T-Iron [25427-06237] 7)  T-Iron Binding Capacity (TIBC) [[address]] 8)  Influenza Vaccine MCR [00025] 9)  T- Capillary Blood Glucose [82948] 10)  Est. Patient Level III [16073]   Immunizations Administered:  Influenza Vaccine # 1:    Vaccine Type: Fluvax MCR    Site: right deltoid    Mfr: GlaxoSmithKline    Dose: 0.5 ml    Route: IM    Given by: Chinita Pester RN    Exp. Date: 08/02/2010    Lot #: [id number]    VIS given: 08/27/09 version given November 26, 2009.  Flu Vaccine  Consent Questions:    Do you have a history of severe allergic reactions to this vaccine? no    Any prior history of allergic reactions to egg and/or gelatin? no    Do you have a sensitivity to the preservative Thimersol? no    Do you have a past history of Guillan-Barre Syndrome? no    Do you currently have an acute febrile illness? no    Have you ever had a severe reaction to latex? no    Vaccine information given and explained to patient? yes    Are you currently pregnant? no   Immunizations Administered:  Influenza Vaccine # 1:    Vaccine Type: Fluvax MCR    Site: right deltoid    Mfr: GlaxoSmithKline    Dose: 0.5 ml    Route: IM    Given by: Chinita Pester RN    Exp. Date: 08/02/2010    Lot #: [id number]    VIS given: 08/27/09 version given November 26, 2009.  Prevention & Chronic Care Immunizations   Influenza vaccine: Fluvax MCR  (11/26/2009)   Influenza vaccine deferral: Not available  (08/15/2008)   Influenza vaccine due: 10/03/2008    Tetanus booster: Not documented   Td booster deferral: Deferred  (08/15/2008)    Pneumococcal vaccine: Pneumovax  (02/27/2009)   Pneumococcal vaccine due: 01/21/2009    H. zoster vaccine: Not documented   H. zoster vaccine deferral: Deferred  (08/15/2008)  Colorectal Screening   Hemoccult: negative x3  (03/12/2009)   Hemoccult action/deferral: Ordered  (02/27/2009)   Hemoccult due: 06/2008    Colonoscopy: Findings: Diverticula, transverse and descending colon. 3 mm sessile descending colon polyp (cold snare). 5 mm sessile rectal polyp (cold snare).  Exam by Jeani Hawking, MD  Pathology:  RECTUM, BIOPSY: FINDINGS CONSISTENT WITH HAMARTOMATOUS POLYP; NO ADENOMATOUS CHANGE OR MALIGNANCY IDENTIFIED.  (08/15/2004)   Colonoscopy due: 08/2009  Other Screening   Pap smear: Not documented   Pap smear action/deferral: Not indicated S/P hysterectomy  (08/15/2008)    Mammogram: ASSESSMENT: Negative - BI-RADS 1^MM DIGITAL SCREENING   (11/07/2008)   Mammogram action/deferral: Ordered  (08/15/2008)   Mammogram due: 03/2008    DXA bone density scan: Not documented   DXA bone density action/deferral: Deferred  (05/01/2009)   Smoking status: quit  (11/26/2009)    Screening comments: Pt will schedule mammogram  Diabetes Mellitus   HgbA1C: 6.7  (11/26/2009)   Hemoglobin A1C due: 08/17/2007    Eye exam: See report. [facility]  (09/28/2007)   Eye exam due: 02/2008    Foot exam: yes  (11/26/2009)   Foot exam action/deferral: Do today   High risk foot: Yes  (11/26/2009)   Foot care education: Done  (11/26/2009)   Foot exam due: 02/26/2010  Urine microalbumin/creatinine ratio: 31.5  (08/15/2008)   Urine microalbumin action/deferral: Ordered   Urine microalbumin/cr due: 08/15/2009    Diabetes flowsheet reviewed?: Yes   Progress toward A1C goal: At goal   Diabetes comments: Patient reports that she has scheduled her eye exam for November  Lipids   Total Cholesterol: 91  (05/01/2009)   Lipid panel action/deferral: Lipid Panel ordered   LDL: 39  (05/01/2009)   LDL Direct: Not documented   HDL: 35  (05/01/2009)   Triglycerides: 83  (05/01/2009)   Lipid panel due: 04/27/2009    SGOT (AST): 14  (05/01/2009)   BMP action: Ordered   SGPT (ALT): 10  (05/01/2009) CMP ordered    Alkaline phosphatase: 49  (05/01/2009)   Total bilirubin: 0.3  (05/01/2009)   Liver panel due: 05/28/2008    Lipid flowsheet reviewed?: Yes   Progress toward LDL goal: At goal  Hypertension   Last Blood Pressure: 159 / 76  (11/26/2009)   Serum creatinine: 1.50  (05/01/2009)   BMP action: Ordered   Serum potassium 4.0  (05/01/2009) CMP ordered    Basic metabolic panel due: 05/15/2009    Hypertension flowsheet reviewed?: Yes   Progress toward BP goal: Deteriorated  Self-Management Support :   Personal Goals (by the next clinic visit) :     Personal A1C goal: 7  (02/27/2009)     Personal blood pressure  goal: 130/80  (02/27/2009)     Personal LDL goal: 100  (02/27/2009)    Patient will work on the following items until the next clinic visit to reach self-care goals:     Medications and monitoring: take my medicines every day, bring all of my medications to every visit  (11/26/2009)     Eating: drink diet soda or water instead of juice or soda, eat more vegetables, use fresh or frozen vegetables, eat foods that are low in salt, eat baked foods instead of fried foods, eat fruit for snacks and desserts  (11/26/2009)     Activity: take a 30 minute walk every day  (11/26/2009)     Home glucose monitoring frequency: 1 time daily  (08/15/2008)    Diabetes self-management support: Education handout, Resources for patients handout, Written self-care plan  (11/26/2009)   Diabetes care plan printed   Diabetes education handout printed   Last diabetes self-management training by diabetes educator: 06/14/2009    Hypertension self-management support: Education handout, Resources for patients handout, Written self-care plan  (11/26/2009)   Hypertension self-care plan printed.   Hypertension education handout printed    Lipid self-management support: Education handout, Resources for patients handout, Written self-care plan  (11/26/2009)   Lipid self-care plan printed.   Lipid education handout printed      Resource handout printed.   Nursing Instructions: Diabetic foot exam today Give Flu vaccine today    Diabetic Foot Exam Foot Inspection Is there a history of a foot ulcer?              No Is there a foot ulcer now?              No Can the patient see the bottom of their feet?          Yes Are the shoes appropriate in style and fit?          Yes Is there swelling or an abnormal foot shape?          No Are the toenails long?  No Are the toenails thick?                No Are the toenails ingrown?              No Is there heavy callous build-up?              No Is there pain in  the calf muscle (Intermittent claudication) when walking?    NoIs there a claw toe deformity?              No Is there elevated skin temperature?            No Is there limited ankle dorsiflexion?            No Is there foot or ankle muscle weakness?            No  Diabetic Foot Care Education Patient educated on appropriate care of diabetic feet.  Pulse Check          Right Foot          Left Foot Posterior Tibial:        normal            normal Dorsalis Pedis:        normal            normal Comments: Callus on heels High Risk Feet? Yes Set Next Diabetic Foot Exam here: 02/26/2010   10-g (5.07) Semmes-Weinstein Monofilament Test Performed by: Filomena Jungling NT II          Right Foot          Left Foot Site 1         normal         normal Site 2         normal         normal Site 3         normal         normal Site 4         normal         normal Site 5         normal         normal Site 6         normal         normal Site 7         normal         normal Site 8         normal         normal Site 9         normal         normal  Impression      normal         normal   Process Orders Check Orders Results:     [facility]: Check successful Tests Sent for requisitioning (November 27, 2009 4:48 PM):     11/26/2009: [facility] -- T-Urine Microalbumin w/creat. ratio [[id number]] (signed)     11/26/2009: [facility] -- T-Comprehensive Metabolic Panel [[id number]] (signed)     11/26/2009: [facility] -- T-CBC w/Diff [[id number]] (signed)     11/26/2009: [facility] -- T-Ferritin [phone] (signed)     11/26/2009: [facility] -- Augusto Gamble [[id number]] (signed)     11/26/2009: [facility] -- T-Iron Binding Capacity (TIBC) [[id number]] (signed)     Laboratory Results   Blood Tests   Date/Time Received: November 26, 2009 2:49 PM  Date/Time Reported: Alric Quan  November 26, 2009 2:49 PM   HGBA1C: 6.7%   (Normal Range: Non-Diabetic - 3-6%   Control Diabetic - 6-8%) CBG Random:: 209mg /dL

## 2010-03-04 NOTE — [facility] Notes (Signed)
Summary: InHome Aide  Phone Note Outgoing Call   Summary of Call: Confirmed with Joan Banks that she was interested in [facility] assessment and that [facility] had dropped off the referral form for [place] to complete. Told [name] that Dr. Meredith Pel concurred that she would benefit from having an aide due to COPD and impaired vision and that I would fax form in today.

## 2010-03-04 NOTE — Assessment & Plan Note (Signed)
Summary: est-ck/fu/meds/cfb   Vital Signs:  Patient profile:   65 year old female Height:      65 inches Weight:      248.9 pounds BMI:     41.57 Temp:     97.2 degrees F oral Pulse rate:   75 / minute BP sitting:   128 / 71  (right arm)  Vitals Entered By: Filomena Jungling NT II (May 01, 2009 8:43 AM) CC: FOLLOW-UP APPOI NTMENT/  APPOINTMENT IN MAY FOR EYE DR. Honor Loh Is Patient Diabetic? Yes Did you bring your meter with you today? No Pain Assessment Patient in pain? no      Nutritional Status BMI of > 30 = obese  Have you ever been in a relationship where you felt threatened, hurt or afraid?No   Does patient need assistance? Functional Status Self care Ambulation Normal   Primary Care Provider:  Margarito Liner MD  CC:  FOLLOW-UP APPOI NTMENT/  APPOINTMENT IN MAY FOR EYE DR. Honor Loh.  History of Present Illness: Patient returns today for follow-up of her diabetes mellitus, hypertension, COPD, obstructive sleep apnea, and other chronic medical problems.  She still reports some persisting cough, and says that her insurance would not cover the codeine-containing cough syrup prescribed at her last visit.  The cough is nonproductive, and she feels that it is provoked by postnasal drainage.  The cough has been gradually improving.  She is using her Nasonex regularly.  She is checking her blood sugar twice a day, and says that it is typically in the 160 to 190 range in the morning, and 90 to 140 range before supper; she did not bring her meter to clinic today.  She is concerned that her home glucose readings are inaccurate.  She has no acute complaints.  She reports that she is compliant with her medications.  Preventive Screening-Counseling & Management  Alcohol-Tobacco     Alcohol drinks/day: 0     Smoking Status: quit     Year Quit: 2002  Caffeine-Diet-Exercise     Does Patient Exercise: yes     Type of exercise: WALKING     Times/week: 5  Current Medications (verified): 1)   Glucotrol Xl 5 Mg Tb24 (Glipizide) .... Take 1 Tablet By Mouth Once A Day 2)  Metformin Hcl 500 Mg Tabs (Metformin Hcl) .... Take 1 Tablet By Mouth Two Times A Day 3)  Furosemide 40 Mg Tabs (Furosemide) .... Take 1 Tablet By Mouth Once A Day 4)  Nasonex 50 Mcg/act Susp (Mometasone Furoate) .... Spray 2 Sprays in Each Nostril Once A Day 5)  Atenolol 25 Mg Tabs (Atenolol) .... Take 2 Tablets By Mouth Once A Day 6)  Combivent 103-18 Mcg/act Aero (Albuterol-Ipratropium) .... Inhale 2 Puffs Four Times A Day 7)  Omeprazole 20 Mg Cpdr (Omeprazole) .... Take 2 Tablets By Mouth Once A Day 8)  Tylenol/codeine #3 300-30 Mg Tabs (Acetaminophen-Codeine) .... Take 1 Tablet By Mouth Three Times A Day As Needed For Pain 9)  Simvastatin 20 Mg  Tabs (Simvastatin) .... Take 1 Tablet By Mouth Once A Day 10)  Enalapril Maleate 10 Mg  Tabs (Enalapril Maleate) .... Take 2 Tablets By Mouth Each Am and 2 Tablets Each Pm 11)  Norvasc 10 Mg Tabs (Amlodipine Besylate) .... Take 1 Tablet By Mouth Once A Day 12)  Se-Tan Plus 162-115.2-1 Mg Caps (Fefum-Fepo-Fa-B Cmp-C-Zn-Mn-Cu) .... Take 1 Capsule By Mouth Once A Day 13)  Xalatan 0.005 % Soln (Latanoprost) .... Take As Directed By Ophthalmologist 14)  Duoneb 0.5-2.5 (3) Mg/95ml  Soln (Ipratropium-Albuterol) .... Use Four Times A Day Per Nebulizer For Shortness of Breath. Diagnosis: Copd (Icd-496) 15)  Bd Ultra-Fine Lancets  Misc (Lancets) .... Once Daily Testing 16)  Aspirin 81 Mg  Tbec (Aspirin) .... Take 1 Tablet By Mouth Once A Day 17)  Clonidine Hcl 0.1 Mg  Tabs (Clonidine Hcl) .... Take 3 Tablets By Mouth Two Times A Day 18)  Prodigy Voice Blood Glucose W/device Kit (Blood Glucose Monitoring Suppl) .... Use To Test Your Blood Sugar 19)  Prodigy Voice Blood Glucose  Strp (Glucose Blood) .... Use To Test Blood Sugar Daily 20)  Prodigy Voice Blood Glucose W/device Kit (Blood Glucose Monitoring Suppl) .... Use To Check Blood Sugar 1x- 2x/day  Allergies (verified): No Known  Drug Allergies  Review of Systems General:  Denies chills and fever. CV:  Denies chest pain or discomfort; stable exertional dyspnea. Resp:  Breathing is stable; on continuous home O2. GI:  Denies abdominal pain, nausea, and vomiting. MS:  Denies muscle aches and cramps. Endo:  Denies excessive thirst and excessive urination.  Physical Exam  General:  alert, no distress Lungs:  normal respiratory effort, normal breath sounds, no crackles, and no wheezes.   Heart:  normal rate, regular rhythm, Grade 2/6 systolic murmur; no gallop, and no rub.   Extremities:  no edema   Impression & Recommendations:  Problem # 1:  COUGH (ICD-786.2) This is provoked by postnasal drip, and likely represents some residual congestion from her prior cold with possibly a component of seasonal allergies.  The plan is to add cetirizine 10 mg daily, and have her continue the Nasonex nasal spray.  i advised to let me know if her cough persists or worsens.  Problem # 2:  DIABETES MELLITUS, TYPE II (ICD-250.00) Patient's diabetes mellitus is well controlled on current regimen.  Plan is to continue current medications, and continue home capillary blood glucose monitoring.  Patient feels that her blood glucose meter is not working properly; will refer to diabetes educator Jamison Neighbor for self-management training and assessment of her meter.  I advised her to bring her glucose meter to each visit.  Will check a comprehensive metabolic panel today.  Her updated medication list for this problem includes:    Glucotrol Xl 5 Mg Tb24 (Glipizide) .Marland Kitchen... Take 1 tablet by mouth once a day    Metformin Hcl 500 Mg Tabs (Metformin hcl) .Marland Kitchen... Take 1 tablet by mouth two times a day    Enalapril Maleate 10 Mg Tabs (Enalapril maleate) .Marland Kitchen... Take 2 tablets by mouth each am and 2 tablets each pm    Aspirin 81 Mg Tbec (Aspirin) .Marland Kitchen... Take 1 tablet by mouth once a day  Labs Reviewed: Creat: 1.50 (11/14/2008)     Last Eye Exam: See  report. [facility] (09/28/2007) Reviewed HgBA1c results: 6.7 (05/01/2009)  6.6 (02/27/2009)  Problem # 3:  HYPERTENSION (ICD-401.9) Patient's blood pressure is well controlled on current regimen.  Plan is to continue current antihypertensive medications.  Her updated medication list for this problem includes:    Furosemide 40 Mg Tabs (Furosemide) .Marland Kitchen... Take 1 tablet by mouth once a day    Atenolol 25 Mg Tabs (Atenolol) .Marland Kitchen... Take 2 tablets by mouth once a day    Enalapril Maleate 10 Mg Tabs (Enalapril maleate) .Marland Kitchen... Take 2 tablets by mouth each am and 2 tablets each pm    Norvasc 10 Mg Tabs (Amlodipine besylate) .Marland Kitchen... Take 1 tablet by mouth once a  day    Clonidine Hcl 0.1 Mg Tabs (Clonidine hcl) .Marland Kitchen... Take 3 tablets by mouth two times a day  BP today: 128/71 Prior BP: 162/87 (02/27/2009)  Labs Reviewed: K+: 3.8 (11/14/2008) Creat: : 1.50 (11/14/2008)   Chol: 96 (04/27/2008)   HDL: 41 (04/27/2008)   LDL: 37 (04/27/2008)   TG: 92 (04/27/2008)  Problem # 4:  HYPERLIPIDEMIA (ICD-272.4) Patient is doing well on current dose of simvastatin without apparent side effects.  The plan is to check a fasting lipid panel (patient reports that she is fasting today).  Her updated medication list for this problem includes:    Simvastatin 20 Mg Tabs (Simvastatin) .Marland Kitchen... Take 1 tablet by mouth once a day  Labs Reviewed: SGOT: 16 (11/14/2008)   SGPT: 10 (11/14/2008)   HDL:41 (04/27/2008), 41 (03/15/2007)  LDL:37 (04/27/2008), 44 (03/15/2007)  Chol:96 (04/27/2008), 102 (03/15/2007)  Trig:92 (04/27/2008), 85 (03/15/2007)  Orders: T-Lipid Profile ([id number])  Problem # 5:  RENAL INSUFFICIENCY, CHRONIC (ICD-585.9) As above, will check a comprehensive metabolic panel today.  Labs Reviewed: BUN: 22 (11/14/2008)   Cr: 1.50 (11/14/2008)    Hgb: 10.1 (11/14/2008)   Hct: 33.6 (11/14/2008)   Ca++: 9.2 (11/14/2008)    TP: 7.8 (11/14/2008)   Alb: 4.3 (11/14/2008)  Problem # 6:   ANEMIA-IRON DEFICIENCY (ICD-280.9) Patient has no symptoms.  Will check labs as below.  Her updated medication list for this problem includes:    Se-tan Plus 162-115.2-1 Mg Caps (Fefum-fepo-fa-b cmp-c-zn-mn-cu) .Marland Kitchen... Take 1 capsule by mouth once a day  Hgb: 10.1 (11/14/2008)   Hct: 33.6 (11/14/2008)   Platelets: 321 (11/14/2008) RBC: 4.60 (11/14/2008)   RDW: 16.0 (11/14/2008)   WBC: 8.2 (11/14/2008) MCV: 73.0 (11/14/2008)   MCHC: 30.1 (11/14/2008) Ferritin: 106 (11/14/2008) Iron: 31 (04/27/2008)   TIBC: 250 (04/27/2008)   % Sat: 12 (04/27/2008)  Orders: T-CBC w/Diff ([id number]) T-Vitamin B12 ([id number]) T-Folic Acid; RBC ([id number])  Problem # 7:  SLEEP APNEA (ICD-780.57) Continue CPAP.  Problem # 8:  COPD (ICD-496) Stable on current regimen; continue as before.  Her updated medication list for this problem includes:    Combivent 103-18 Mcg/act Aero (Albuterol-ipratropium) ..... Inhale 2 puffs four times a day    Duoneb 0.5-2.5 (3) Mg/90ml Soln (Ipratropium-albuterol) ..... Use four times a day per nebulizer for shortness of breath. diagnosis: copd (icd-496)  Complete Medication List: 1)  Glucotrol Xl 5 Mg Tb24 (Glipizide) .... Take 1 tablet by mouth once a day 2)  Metformin Hcl 500 Mg Tabs (Metformin hcl) .... Take 1 tablet by mouth two times a day 3)  Furosemide 40 Mg Tabs (Furosemide) .... Take 1 tablet by mouth once a day 4)  Nasonex 50 Mcg/act Susp (Mometasone furoate) .... Spray 2 sprays in each nostril once a day 5)  Atenolol 25 Mg Tabs (Atenolol) .... Take 2 tablets by mouth once a day 6)  Combivent 103-18 Mcg/act Aero (Albuterol-ipratropium) .... Inhale 2 puffs four times a day 7)  Omeprazole 20 Mg Cpdr (Omeprazole) .... Take 2 tablets by mouth once a day 8)  Tylenol/codeine #3 300-30 Mg Tabs (Acetaminophen-codeine) .... Take 1 tablet by mouth three times a day as needed for pain 9)  Simvastatin 20 Mg Tabs (Simvastatin) .... Take 1 tablet by mouth once a day 10)   Enalapril Maleate 10 Mg Tabs (Enalapril maleate) .... Take 2 tablets by mouth each am and 2 tablets each pm 11)  Norvasc 10 Mg Tabs (Amlodipine besylate) .... Take 1 tablet by mouth once a day 12)  Se-tan Plus 162-115.2-1 Mg Caps (Fefum-fepo-fa-b cmp-c-zn-mn-cu) .... Take 1 capsule by mouth once a day 13)  Xalatan 0.005 % Soln (Latanoprost) .... Take as directed by ophthalmologist 14)  Duoneb 0.5-2.5 (3) Mg/98ml Soln (Ipratropium-albuterol) .... Use four times a day per nebulizer for shortness of breath. diagnosis: copd (icd-496) 15)  Bd Ultra-fine Lancets Misc (Lancets) .... Once daily testing 16)  Aspirin 81 Mg Tbec (Aspirin) .... Take 1 tablet by mouth once a day 17)  Clonidine Hcl 0.1 Mg Tabs (Clonidine hcl) .... Take 3 tablets by mouth two times a day 18)  Prodigy Voice Blood Glucose W/device Kit (Blood glucose monitoring suppl) .... Use to test your blood sugar 19)  Prodigy Voice Blood Glucose Strp (Glucose blood) .... Use to test blood sugar daily 20)  Prodigy Voice Blood Glucose W/device Kit (Blood glucose monitoring suppl) .... Use to check blood sugar 1x- 2x/day 21)  Cetirizine Hcl 10 Mg Tabs (Cetirizine hcl) .... Take 1 tablet by mouth once a day  Other Orders: T- Capillary Blood Glucose (16109) T-Hgb A1C (in-house) ([id number]) T-Comprehensive Metabolic Panel ([id number])  Patient Instructions: 1)  Please schedule a follow-up appointment in 3 months. 2)  Please schedule an appointment with Jamison Neighbor for diabetes self-management training. 3)  Start cetirizine 10 mg daily for nasal congestion.  Prescriptions: CETIRIZINE HCL 10 MG TABS (CETIRIZINE HCL) Take 1 tablet by mouth once a day  #30 x 6   Entered and Authorized by:   Margarito Liner MD   Signed by:   Margarito Liner MD on 05/01/2009   Method used:   Electronically to        [facility] (retail)       [address]       [place], [place]  [id number]       Ph: [phone]       Fax: [phone]    RxID:   [id number]   Prevention & Chronic Care Immunizations   Influenza vaccine: Historical  (11/14/2008)   Influenza vaccine deferral: Not available  (08/15/2008)   Influenza vaccine due: 10/03/2008    Tetanus booster: Not documented   Td booster deferral: Deferred  (08/15/2008)    Pneumococcal vaccine: Pneumovax  (02/27/2009)   Pneumococcal vaccine due: 01/21/2009    H. zoster vaccine: Not documented   H. zoster vaccine deferral: Deferred  (08/15/2008)  Colorectal Screening   Hemoccult: negative x3  (03/12/2009)   Hemoccult action/deferral: Ordered  (02/27/2009)   Hemoccult due: 06/2008    Colonoscopy: Findings: Diverticula, transverse and descending colon. 3 mm sessile descending colon polyp (cold snare). 5 mm sessile rectal polyp (cold snare).  Exam by Jeani Hawking, MD  Pathology:  RECTUM, BIOPSY: FINDINGS CONSISTENT WITH HAMARTOMATOUS POLYP; NO ADENOMATOUS CHANGE OR MALIGNANCY IDENTIFIED.  (08/15/2004)   Colonoscopy due: 08/2009  Other Screening   Pap smear: Not documented   Pap smear action/deferral: Not indicated S/P hysterectomy  (08/15/2008)    Mammogram: ASSESSMENT: Negative - BI-RADS 1^MM DIGITAL SCREENING  (11/07/2008)   Mammogram action/deferral: Ordered  (08/15/2008)   Mammogram due: 03/2008    DXA bone density scan: Not documented   DXA bone density action/deferral: Deferred  (05/01/2009)  Reports requested:  Smoking status: quit  (05/01/2009)  Diabetes Mellitus   HgbA1C: 6.7  (05/01/2009)   Hemoglobin A1C due: 08/17/2007    Eye exam: See report. [facility]  (09/28/2007)   Last eye exam report requested.   Eye exam due: 02/2008    Foot exam: yes  (  02/27/2009)   Foot exam action/deferral: Do today   High risk foot: Yes  (02/27/2009)   Foot care education: Done  (02/27/2009)   Foot exam due: 05/28/2009    Urine microalbumin/creatinine ratio: 31.5  (08/15/2008)   Urine microalbumin/cr due: 08/15/2009     Diabetes flowsheet reviewed?: Yes   Progress toward A1C goal: At goal   Diabetes comments: Has eye appt in May at [facility]  Lipids   Total Cholesterol: 96  (04/27/2008)   Lipid panel action/deferral: Lipid Panel ordered   LDL: 37  (04/27/2008)   LDL Direct: Not documented   HDL: 41  (04/27/2008)   Triglycerides: 92  (04/27/2008)   Lipid panel due: 04/27/2009    SGOT (AST): 16  (11/14/2008)   BMP action: Ordered   SGPT (ALT): 10  (11/14/2008) CMP ordered    Alkaline phosphatase: 60  (11/14/2008)   Total bilirubin: 0.3  (11/14/2008)   Liver panel due: 05/28/2008    Lipid flowsheet reviewed?: Yes   Progress toward LDL goal: At goal  Hypertension   Last Blood Pressure: 128 / 71  (05/01/2009)   Serum creatinine: 1.50  (11/14/2008)   BMP action: Ordered   Serum potassium 3.8  (11/14/2008) CMP ordered    Basic metabolic panel due: 05/15/2009    Hypertension flowsheet reviewed?: Yes   Progress toward BP goal: At goal  Self-Management Support :   Personal Goals (by the next clinic visit) :     Personal A1C goal: 7  (02/27/2009)     Personal blood pressure goal: 130/80  (02/27/2009)     Personal LDL goal: 100  (02/27/2009)    Patient will work on the following items until the next clinic visit to reach self-care goals:     Medications and monitoring: take my medicines every day  (05/01/2009)     Eating: drink diet soda or water instead of juice or soda, eat more vegetables, use fresh or frozen vegetables, eat foods that are low in salt, eat baked foods instead of fried foods, eat fruit for snacks and desserts  (05/01/2009)     Activity: take a 30 minute walk every day  (05/01/2009)     Home glucose monitoring frequency: 1 time daily  (08/15/2008)    Diabetes self-management support: Education handout, Resources for patients handout, Written self-care plan  (05/01/2009)   Diabetes care plan printed   Diabetes education handout printed    Hypertension  self-management support: Education handout, Resources for patients handout, Written self-care plan  (05/01/2009)   Hypertension self-care plan printed.   Hypertension education handout printed    Lipid self-management support: Education handout, Resources for patients handout, Written self-care plan  (05/01/2009)   Lipid self-care plan printed.   Lipid education handout printed      Resource handout printed.   Nursing Instructions: Request report of last diabetic eye exam   Process Orders Check Orders Results:     [facility]: Check successful Tests Sent for requisitioning (May 01, 2009 6:10 PM):     05/01/2009: [facility] -- T-Lipid Profile [id number] (signed)     05/01/2009: [facility] -- T-Comprehensive Metabolic Panel [[id number]] (signed)     05/01/2009: [facility] -- T-CBC w/Diff [[id number]] (signed)     05/01/2009: [facility] -- T-Vitamin B12 [phone] (signed)     05/01/2009: [facility] -- T-Folic Acid; RBC [[id number]] (signed)    Laboratory Results   Blood Tests   Date/Time Received: May 01, 2009 8:58  AM Date/Time Reported: Alric Quan  May 01, 2009 8:58 AM   HGBA1C: 6.7%   (Normal Range: Non-Diabetic - 3-6%   Control Diabetic - 6-8%) CBG Fasting:: 150mg /dL

## 2010-03-04 NOTE — Letter (Signed)
Summary: [employer]  [employer]   Imported By: Margie Billet 06/17/2009 10:59:58  _____________________________________________________________________  External Attachment:    Type:   Image     Comment:   External Document

## 2010-03-04 NOTE — Progress Notes (Signed)
Summary: diabetes support/dmr  Phone Note Call from Patient Call back at Home Phone [phone]   Caller: Patient Summary of Call: triage put call through for meter problems: patient says her new meter ( see 12/10 note)is also reading higher than her sugars had been previously and she is not eating any more than usual and is sometimes eating less. She took "3 pills" just to see what would happen and it went down to 80 and then I stopped testing. readings are mostly175-200, and once meter read "HI". somtimes 130. she will stop eating when it gets high and then gets a headache.  I do not think her blood sugars being higher than usual is a problem with  her meter. Could be her blood sugars are higher than usual and/or user error. Advised her that most likely her blood sugars are higher and that this can be that her diabetes is chainging or she might have infection. she does not feel bad and has no symptoms of illness or infection. "I feel fine". Does not want to come in sooner than 02/27/09 appointment.  asked her to eat fewer carbs, drink plenty of water and bring her meters in to her appointment. She did not want  an appointment t with CDE on same day. She did say she would schedule one when she is here.    also confirmed that she does want to get her diabetes supplies through "[employer]" who are not listed on Medicare.gov website as a Training and development officer.  Initial call taken by: Jamison Neighbor RD,CDE,  February 12, 2009 2:12 PM

## 2010-03-04 NOTE — Letter (Signed)
Summary: BLOOD GLUCOSE REPORT  BLOOD GLUCOSE REPORT   Imported By: Margie Billet 06/17/2009 15:28:24  _____________________________________________________________________  External Attachment:    Type:   Image     Comment:   External Document

## 2010-03-04 NOTE — Letter (Signed)
Summary: [employer]  [employer]   Imported By: Margie Billet 05/30/2009 12:01:19  _____________________________________________________________________  External Attachment:    Type:   Image     Comment:   External Document

## 2010-03-04 NOTE — Progress Notes (Signed)
Summary: back brace/ hla  Phone Note Call from Patient   Summary of Call: pt calls to ask about a back brace and "did dr Meredith Pel sign it yet?" i ask what company the brace was coming from- she didn't know, i ask for the contact person's name- she didn't know, i ask for a telephone #- she did not know. i ask if she called a company to get the brace, she said no, they called her. i instructed pt if the co. rep called her again to get the name and ph# and call the clinic with that info. she states she will. pt has upcoming appt in oct Initial call taken by: Marin Roberts RN,  October 01, 2009 2:41 PM  Follow-up for Phone Call        I don't feel that the data in general supports the benefit of back braces.  I would suggest that patient be seen in clinic if she is having back pain. Follow-up by: Margarito Liner MD,  October 03, 2009 2:38 PM

## 2010-03-04 NOTE — Miscellaneous (Signed)
Summary: MEDICAL EQUIPMENT   MEDICAL EQUIPMENT   Imported By: Margie Billet 06/17/2009 12:03:00  _____________________________________________________________________  External Attachment:    Type:   Image     Comment:   External Document

## 2010-03-04 NOTE — Letter (Signed)
Summary: Pharmac[facility]   Imported By: Florinda Marker 03/06/2009 16:50:17  _____________________________________________________________________  External Attachment:    Type:   Image     Comment:   External Document

## 2010-03-04 NOTE — Miscellaneous (Signed)
Summary: PCS declined  Request came in from [facility] to have [place] request PCS assessment.   Fax back to agency today that she is independent of ADL's.

## 2010-03-04 NOTE — Miscellaneous (Signed)
Summary: [facility]  [facility]   Imported By: Margie Billet 06/12/2009 13:55:44  _____________________________________________________________________  External Attachment:    Type:   Image     Comment:   External Document

## 2010-03-04 NOTE — Assessment & Plan Note (Signed)
Summary: diabetes/set meter/dmr   Allergies: No Known Drug Allergies   Complete Medication List: 1)  Glucotrol Xl 5 Mg Tb24 (Glipizide) .... Take 1 tablet by mouth once a day 2)  Metformin Hcl 500 Mg Tabs (Metformin hcl) .... Take 1 tablet by mouth two times a day 3)  Furosemide 40 Mg Tabs (Furosemide) .... Take 1 tablet by mouth once a day 4)  Nasonex 50 Mcg/act Susp (Mometasone furoate) .... Spray 2 sprays in each nostril once a day 5)  Atenolol 25 Mg Tabs (Atenolol) .... Take 2 tablets by mouth once a day 6)  Combivent 103-18 Mcg/act Aero (Albuterol-ipratropium) .... Inhale 2 puffs four times a day 7)  Omeprazole 20 Mg Cpdr (Omeprazole) .... Take 2 tablets by mouth once a day 8)  Tylenol/codeine #3 300-30 Mg Tabs (Acetaminophen-codeine) .... Take 1 tablet by mouth three times a day as needed for pain 9)  Simvastatin 20 Mg Tabs (Simvastatin) .... Take 1 tablet by mouth once a day 10)  Enalapril Maleate 10 Mg Tabs (Enalapril maleate) .... Take 2 tablets by mouth each am and 2 tablets each pm 11)  Norvasc 10 Mg Tabs (Amlodipine besylate) .... Take 1 tablet by mouth once a day 12)  Se-tan Plus 162-115.2-1 Mg Caps (Fefum-fepo-fa-b cmp-c-zn-mn-cu) .... Take 1 capsule by mouth once a day 13)  Xalatan 0.005 % Soln (Latanoprost) .... Take as directed by ophthalmologist 14)  Duoneb 0.5-2.5 (3) Mg/23ml Soln (Ipratropium-albuterol) .... Use four times a day per nebulizer for shortness of breath. diagnosis: copd (icd-496) 15)  Bd Ultra-fine Lancets Misc (Lancets) .... Once daily testing 16)  Aspirin 81 Mg Tbec (Aspirin) .... Take 1 tablet by mouth once a day 17)  Clonidine Hcl 0.1 Mg Tabs (Clonidine hcl) .... Take 3 tablets by mouth two times a day 18)  Prodigy Voice Blood Glucose W/device Kit (Blood glucose monitoring suppl) .... Use to test your blood sugar 19)  Prodigy Voice Blood Glucose Strp (Glucose blood) .... Use to test blood sugar daily 20)  Prodigy Voice Blood Glucose W/device Kit (Blood  glucose monitoring suppl) .... Use to check blood sugar 1x- 2x/day 21)  Cetirizine Hcl 10 Mg Tabs (Cetirizine hcl) .... Take 1 tablet by mouth once a day  Other Orders: DSMT(Medicare) Individual, 30 Minutes (G0108)  Diabetes Self Management Training  PCP: Margarito Liner MD Other persons present: no Current smoking Status: quit  Assessment Work Hours: Not currently working Affect: Appropriate Readiness to learn:   Contemplative  Potential Barriers  Visual Impairment  Transportation  Economic/Supplies  Diabetes Medications:  Comments: has Chief Strategy Officer. downloaded meter and reset clock per patient request. Also performed control which came up within range. she feels accuracy of meter is not good. Discussed possible sources fo error such as food on her n=hands when testing, but she doesn;t feel this could be causing highs on meter because she tests with her husbands Prodigy pocket meter and the results are much different. (as much as a 100 or more points different) She also could have had a bad batch of strips. She feels that meter results are not consistent with her A1C as well. Suggested she get a different meter and get supplies from a pharnmacy like [facility] or [facility] who may be willing to bill Medicaid part of higher quality meter.  she was not interested today.    Monitoring Self monitoring blood glucose 2 times a day Name of Meter  Prodigy Autocode      Nutrition assessment Biggest challenge to  eating healthy: says she knows she doesn't always eat like she should Diabetes Disease Process  Discussed today State diabetes is treated by meal plan-exercise-medication-monitoring-education: Demonstrates competency Medications  Nutritional Management State changes planned for home meals/snacks: Needs review/assistance Monitoring State purpose and frequency of monitoring BG-ketones-HgbA1C  : Needs review/assistancePerform glucose monitoring/ketone testing and record  results correctly: Needs review/assistanceState target blood glucose and HgbA1C goals: Needs review/assistanceDiabetes Management Education Done: 06/14/2009    BEHAVIORAL GOALS INITIAL Incorporating appropriate nutritional management: try to eat meat or cereal with tomato juice or tomato, orange to increase iron absorption        list of high iron foods provided today and patient counseled on how to incorportate resonably priced sources of high iron foods.  Diabetes Self Management Support: husband, clinic staff Follow-up:as needed

## 2010-03-04 NOTE — Assessment & Plan Note (Signed)
Summary: EST-CK/FU/MEDS/CFB   Vital Signs:  Patient profile:   65 year old female Height:      65 inches Weight:      249.2 pounds BMI:     41.62 Temp:     98.1 degrees F oral Pulse rate:   81 / minute BP sitting:   162 / 87  (right arm)  Vitals Entered By: Filomena Jungling NT II (February 27, 2009 11:38 AM) CC: follow-up visit Is Patient Diabetic? Yes Did you bring your meter with you today? Yes Nutritional Status BMI of > 30 = obese CBG Result 77  Have you ever been in a relationship where you felt threatened, hurt or afraid?No   Does patient need assistance? Functional Status Self care Ambulation Normal    Primary Care Provider:  Margarito Liner MD  CC:  follow-up visit.  History of Present Illness: Patient returns today for follow-up of her diabetes mellitus, hypertension, COPD, obstructive sleep apnea, and other chronic medical problems.  She reports a recent cold, which is improving but she still has a persistent cough and she requested a prescription cough syrup; the cough bothers her mainly at night, and is nonproductive.  Otherwise today she has no acute complaints.  She got a new glucose meter and feels that her readings are higher than they were with her old meter.  She reports that she is compliant with her medications; however, she has recently been out of her clonidine.  Preventive Screening-Counseling & Management  Alcohol-Tobacco     Alcohol drinks/day: 0     Smoking Status: quit     Year Quit: 2002  Caffeine-Diet-Exercise     Does Patient Exercise: yes     Type of exercise: WALKING     Times/week: 5  Current Medications (verified): 1)  Glucotrol Xl 5 Mg Tb24 (Glipizide) .... Take 1 Tablet By Mouth Once A Day 2)  Metformin Hcl 500 Mg Tabs (Metformin Hcl) .... Take 1 Tablet By Mouth Two Times A Day 3)  Furosemide 40 Mg Tabs (Furosemide) .... Take 1 Tablet By Mouth Once A Day 4)  Flonase 50 Mcg/act  Susp (Fluticasone Propionate) .... Spray 2 Sprays in Each  Nostril Once A Day 5)  Atenolol 25 Mg Tabs (Atenolol) .... Take 2 Tablets By Mouth Once A Day 6)  Combivent 103-18 Mcg/act Aero (Albuterol-Ipratropium) .... Inhale 2 Puffs Four Times A Day 7)  Omeprazole 20 Mg Cpdr (Omeprazole) .... Take 2 Tablets By Mouth Once A Day 8)  Tylenol/codeine #3 300-30 Mg Tabs (Acetaminophen-Codeine) .... Take 1 Tablet By Mouth Three Times A Day As Needed For Pain 9)  Simvastatin 20 Mg  Tabs (Simvastatin) .... Take 1 Tablet By Mouth Once A Day 10)  Enalapril Maleate 10 Mg  Tabs (Enalapril Maleate) .... Take 2 Tablets By Mouth Each Am and 2 Tablets Each Pm 11)  Norvasc 10 Mg Tabs (Amlodipine Besylate) .... Take 1 Tablet By Mouth Once A Day 12)  Tandem Plus 162-115.2-1 Mg Caps (Fefum-Fepo-Fa-B Cmp-C-Zn-Mn-Cu) .... Take 1 Capsule By Mouth Once A Day 13)  Xalatan 0.005 % Soln (Latanoprost) .... Take As Directed By Ophthalmologist 14)  Duoneb 0.5-2.5 (3) Mg/39ml  Soln (Ipratropium-Albuterol) .... Use Four Times A Day Per Nebulizer For Shortness of Breath. Diagnosis: Copd (Icd-496) 15)  Bd Ultra-Fine Lancets  Misc (Lancets) .... Once Daily Testing 16)  Aspirin 81 Mg  Tbec (Aspirin) .... Take 1 Tablet By Mouth Once A Day 17)  Clonidine Hcl 0.1 Mg  Tabs (Clonidine Hcl) .... Take  3 Tablets By Mouth Two Times A Day 18)  Prodigy Voice Blood Glucose W/device Kit (Blood Glucose Monitoring Suppl) .... Use To Test Your Blood Sugar 19)  Prodigy Voice Blood Glucose  Strp (Glucose Blood) .... Use To Test Blood Sugar Daily 20)  Prodigy Voice Blood Glucose W/device Kit (Blood Glucose Monitoring Suppl) .... Use To Check Blood Sugar 1x- 2x/day  Allergies (verified): No Known Drug Allergies  Physical Exam  General:  alert, no distress Lungs:  normal respiratory effort, normal breath sounds, no crackles, and no wheezes.   Heart:  normal rate, regular rhythm, Grade 2/6 systolic murmur; no gallop, and no rub.   Extremities:  no edema; seafood exam form  Diabetes Management Exam:    Foot  Exam (with socks and/or shoes not present):       Sensory-Monofilament:          Left foot: normal          Right foot: normal   Impression & Recommendations:  Problem # 1:  DIABETES MELLITUS, TYPE II (ICD-250.00) Patient's diabetes mellitus is well controlled on current regimen.  Plan is to continue current medications, and continue home capillary blood glucose monitoring.   Her updated medication list for this problem includes:    Glucotrol Xl 5 Mg Tb24 (Glipizide) .Marland Kitchen... Take 1 tablet by mouth once a day    Metformin Hcl 500 Mg Tabs (Metformin hcl) .Marland Kitchen... Take 1 tablet by mouth two times a day    Enalapril Maleate 10 Mg Tabs (Enalapril maleate) .Marland Kitchen... Take 2 tablets by mouth each am and 2 tablets each pm    Aspirin 81 Mg Tbec (Aspirin) .Marland Kitchen... Take 1 tablet by mouth once a day  Labs Reviewed: Creat: 1.50 (11/14/2008)     Last Eye Exam: See report. [facility] (09/28/2007) Reviewed HgBA1c results: 6.6 (02/27/2009)  6.7 (11/14/2008)  Orders: T- Capillary Blood Glucose (82948) T-Hgb A1C (in-house) (16109UE)  Problem # 2:  HYPERTENSION (ICD-401.9) The patient's blood pressure is above target range, but she has been out of her clonidine.  I advised her to refill her clonidine and resume it at her previous dose.  Will recheck her blood pressure upon return.  Her updated medication list for this problem includes:    Furosemide 40 Mg Tabs (Furosemide) .Marland Kitchen... Take 1 tablet by mouth once a day    Atenolol 25 Mg Tabs (Atenolol) .Marland Kitchen... Take 2 tablets by mouth once a day    Enalapril Maleate 10 Mg Tabs (Enalapril maleate) .Marland Kitchen... Take 2 tablets by mouth each am and 2 tablets each pm    Norvasc 10 Mg Tabs (Amlodipine besylate) .Marland Kitchen... Take 1 tablet by mouth once a day    Clonidine Hcl 0.1 Mg Tabs (Clonidine hcl) .Marland Kitchen... Take 3 tablets by mouth two times a day  BP today: 162/87 Prior BP: 151/75 (11/14/2008)  Labs Reviewed: K+: 3.8 (11/14/2008) Creat: : 1.50 (11/14/2008)    Chol: 96 (04/27/2008)   HDL: 41 (04/27/2008)   LDL: 37 (04/27/2008)   TG: 92 (04/27/2008)  Problem # 3:  HYPERLIPIDEMIA (ICD-272.4) Patient is doing well on current dose of simvastatin.  Will recheck a lipid panel at next visit.  Her updated medication list for this problem includes:    Simvastatin 20 Mg Tabs (Simvastatin) .Marland Kitchen... Take 1 tablet by mouth once a day  Labs Reviewed: SGOT: 16 (11/14/2008)   SGPT: 10 (11/14/2008)   HDL:41 (04/27/2008), 41 (03/15/2007)  LDL:37 (04/27/2008), 44 (03/15/2007)  Chol:96 (04/27/2008), 102 (03/15/2007)  Trig:92 (04/27/2008),  85 (03/15/2007)  Problem # 4:  SLEEP APNEA (ICD-780.57) Patient reports no problems with her CPAP.  Will continue as before.  Problem # 5:  COPD (ICD-496)  Patient is doing well on her current regimen.  Her updated medication list for this problem includes:    Combivent 103-18 Mcg/act Aero (Albuterol-ipratropium) ..... Inhale 2 puffs four times a day    Duoneb 0.5-2.5 (3) Mg/18ml Soln (Ipratropium-albuterol) ..... Use four times a day per nebulizer for shortness of breath. diagnosis: copd (icd-496)  Problem # 6:  ANEMIA-IRON DEFICIENCY (ICD-280.9) Will check a CBC today.  Her updated medication list for this problem includes:    Tandem Plus 162-115.2-1 Mg Caps (Fefum-fepo-fa-b cmp-c-zn-mn-cu) .Marland Kitchen... Take 1 capsule by mouth once a day  Orders: T-CBC w/Diff ([id number])  Hgb: 10.1 (11/14/2008)   Hct: 33.6 (11/14/2008)   Platelets: 321 (11/14/2008) RBC: 4.60 (11/14/2008)   RDW: 16.0 (11/14/2008)   WBC: 8.2 (11/14/2008) MCV: 73.0 (11/14/2008)   MCHC: 30.1 (11/14/2008) Ferritin: 106 (11/14/2008) Iron: 31 (04/27/2008)   TIBC: 250 (04/27/2008)   % Sat: 12 (04/27/2008)  Problem # 7:  COUGH (ICD-786.2) Will treat symptomatically with a codeine-containing cough syrup.  I advised her to let me know if her cough persists or worsens.  Complete Medication List: 1)  Glucotrol Xl 5 Mg Tb24 (Glipizide) .... Take 1 tablet by mouth once a  day 2)  Metformin Hcl 500 Mg Tabs (Metformin hcl) .... Take 1 tablet by mouth two times a day 3)  Furosemide 40 Mg Tabs (Furosemide) .... Take 1 tablet by mouth once a day 4)  Nasonex 50 Mcg/act Susp (Mometasone furoate) .... Spray 2 sprays in each nostril once a day 5)  Atenolol 25 Mg Tabs (Atenolol) .... Take 2 tablets by mouth once a day 6)  Combivent 103-18 Mcg/act Aero (Albuterol-ipratropium) .... Inhale 2 puffs four times a day 7)  Omeprazole 20 Mg Cpdr (Omeprazole) .... Take 2 tablets by mouth once a day 8)  Tylenol/codeine #3 300-30 Mg Tabs (Acetaminophen-codeine) .... Take 1 tablet by mouth three times a day as needed for pain 9)  Simvastatin 20 Mg Tabs (Simvastatin) .... Take 1 tablet by mouth once a day 10)  Enalapril Maleate 10 Mg Tabs (Enalapril maleate) .... Take 2 tablets by mouth each am and 2 tablets each pm 11)  Norvasc 10 Mg Tabs (Amlodipine besylate) .... Take 1 tablet by mouth once a day 12)  Tandem Plus 162-115.2-1 Mg Caps (Fefum-fepo-fa-b cmp-c-zn-mn-cu) .... Take 1 capsule by mouth once a day 13)  Xalatan 0.005 % Soln (Latanoprost) .... Take as directed by ophthalmologist 14)  Duoneb 0.5-2.5 (3) Mg/55ml Soln (Ipratropium-albuterol) .... Use four times a day per nebulizer for shortness of breath. diagnosis: copd (icd-496) 15)  Bd Ultra-fine Lancets Misc (Lancets) .... Once daily testing 16)  Aspirin 81 Mg Tbec (Aspirin) .... Take 1 tablet by mouth once a day 17)  Clonidine Hcl 0.1 Mg Tabs (Clonidine hcl) .... Take 3 tablets by mouth two times a day 18)  Prodigy Voice Blood Glucose W/device Kit (Blood glucose monitoring suppl) .... Use to test your blood sugar 19)  Prodigy Voice Blood Glucose Strp (Glucose blood) .... Use to test blood sugar daily 20)  Prodigy Voice Blood Glucose W/device Kit (Blood glucose monitoring suppl) .... Use to check blood sugar 1x- 2x/day 21)  Guaifenesin-codeine 100-10 Mg/74ml Syrp (Guaifenesin-codeine) .... Take 1-2 teaspoonfuls every 4 hours as  needed for cough  Other Orders: T-Hemoccult Card-Multiple (take home) (41660) T-Basic Metabolic Panel ([id number]) Pneumococcal  Vaccine (16109)  Patient Instructions: 1)  Please schedule a follow-up appointment in 2 months. 2)  Take guiafenesin-codeine cough syrup 1-2 teaspoonfuls every 4 hours as needed for cough. 3)  Refill clonidine and take as directed. 4)  Please make appointment for eye exam. 5)  Please complete and return hemoccult cards as instructed.  Prescriptions: NASONEX 50 MCG/ACT SUSP (MOMETASONE FUROATE) Spray 2 sprays in each nostril once a day  #1 x 11   Entered and Authorized by:   Margarito Liner MD   Signed by:   Margarito Liner MD on 02/27/2009   Method used:   Electronically to        [facility] (retail)       [address]       [place], [place]  [id number]       Ph: [phone]       Fax: [phone]   RxID:   [id number] GUAIFENESIN-CODEINE 100-10 MG/5ML SYRP (GUAIFENESIN-CODEINE) Take 1-2 teaspoonfuls every 4 hours as needed for cough  #480 mL x 0   Entered and Authorized by:   Margarito Liner MD   Signed by:   Margarito Liner MD on 02/27/2009   Method used:   Reprint   RxID:   [id number] CLONIDINE HCL 0.1 MG  TABS (CLONIDINE HCL) Take 3 tablets by mouth two times a day  #180 x 6   Entered and Authorized by:   Margarito Liner MD   Signed by:   Margarito Liner MD on 02/27/2009   Method used:   Reprint   RxID:   [id number] GUAIFENESIN-CODEINE 100-10 MG/5ML SYRP (GUAIFENESIN-CODEINE) Take 1-2 teaspoonfuls every 4 hours as needed for cough  #480 mL x 0   Entered and Authorized by:   Margarito Liner MD   Signed by:   Margarito Liner MD on 02/27/2009   Method used:   Print then Give to Patient   RxID:   [id number] CLONIDINE HCL 0.1 MG  TABS (CLONIDINE HCL) Take 3 tablets by mouth two times a day  #180 x 6   Entered and Authorized by:   Margarito Liner MD   Signed by:   Margarito Liner MD on 02/27/2009   Method used:   Print then Give  to Patient   RxID:   [id number]   Process Orders Check Orders Results:     [facility]: Check successful Tests Sent for requisitioning (February 28, 2009 8:40 PM):     02/27/2009: [facility] -- T-CBC w/Diff [[id number]] (signed)     02/27/2009: [facility] -- T-Basic Metabolic Panel [id number] (signed)    Prevention & Chronic Care Immunizations   Influenza vaccine: Historical  (11/14/2008)   Influenza vaccine deferral: Not available  (08/15/2008)   Influenza vaccine due: 10/03/2008    Tetanus booster: Not documented   Td booster deferral: Deferred  (08/15/2008)    Pneumococcal vaccine: Pneumovax  (02/27/2009)   Pneumococcal vaccine due: 01/21/2009    H. zoster vaccine: Not documented   H. zoster vaccine deferral: Deferred  (08/15/2008)    Immunization comments: Had tetanus boosterper patient  Colorectal Screening   Hemoccult: negative x 3  (05/30/2007)   Hemoccult action/deferral: Ordered  (02/27/2009)   Hemoccult due: 06/2008    Colonoscopy: Findings: Diverticula, transverse and descending colon. 3 mm sessile descending colon polyp (cold snare). 5 mm sessile rectal polyp (cold snare).  Exam by Jeani Hawking, MD  Pathology:  RECTUM, BIOPSY: FINDINGS CONSISTENT WITH HAMARTOMATOUS POLYP; NO ADENOMATOUS CHANGE OR MALIGNANCY IDENTIFIED.  (  08/15/2004)   Colonoscopy due: 08/2009  Other Screening   Pap smear: Not documented   Pap smear action/deferral: Not indicated S/P hysterectomy  (08/15/2008)    Mammogram: ASSESSMENT: Negative - BI-RADS 1^MM DIGITAL SCREENING  (11/07/2008)   Mammogram action/deferral: Ordered  (08/15/2008)   Mammogram due: 03/2008    DXA bone density scan: Not documented   Smoking status: quit  (02/27/2009)  Diabetes Mellitus   HgbA1C: 6.6  (02/27/2009)   Hemoglobin A1C due: 08/17/2007    Eye exam: See report. [facility]  (09/28/2007)   Eye exam due:  02/2008    Foot exam: yes  (02/27/2009)   Foot exam action/deferral: Do today   High risk foot: Yes  (02/27/2009)   Foot care education: Done  (02/27/2009)   Foot exam due: 05/28/2009    Urine microalbumin/creatinine ratio: 31.5  (08/15/2008)   Urine microalbumin/cr due: 08/15/2009    Diabetes flowsheet reviewed?: Yes   Progress toward A1C goal: At goal   Diabetes comments: Patient will make eye appointment  Lipids   Total Cholesterol: 96  (04/27/2008)   LDL: 37  (04/27/2008)   LDL Direct: Not documented   HDL: 41  (04/27/2008)   Triglycerides: 92  (04/27/2008)   Lipid panel due: 04/27/2009    SGOT (AST): 16  (11/14/2008)   SGPT (ALT): 10  (11/14/2008)   Alkaline phosphatase: 60  (11/14/2008)   Total bilirubin: 0.3  (11/14/2008)   Liver panel due: 05/28/2008    Lipid flowsheet reviewed?: Yes   Progress toward LDL goal: At goal  Hypertension   Last Blood Pressure: 162 / 87  (02/27/2009)   Serum creatinine: 1.50  (11/14/2008)   BMP action: Ordered   Serum potassium 3.8  (11/14/2008)   Basic metabolic panel due: 05/15/2009    Hypertension flowsheet reviewed?: Yes   Progress toward BP goal: Deteriorated  Self-Management Support :   Personal Goals (by the next clinic visit) :     Personal A1C goal: 7  (02/27/2009)     Personal blood pressure goal: 130/80  (02/27/2009)     Personal LDL goal: 100  (02/27/2009)    Patient will work on the following items until the next clinic visit to reach self-care goals:     Medications and monitoring: take my medicines every day  (02/27/2009)     Eating: eat more vegetables, use fresh or frozen vegetables, eat foods that are low in salt, eat baked foods instead of fried foods, eat fruit for snacks and desserts  (02/27/2009)     Activity: take a 30 minute walk every day  (02/27/2009)     Home glucose monitoring frequency: 1 time daily  (08/15/2008)    Diabetes self-management support: Copy of home glucose meter record, Written  self-care plan  (02/27/2009)   Diabetes care plan printed    Hypertension self-management support: Copy of home glucose meter record, Written self-care plan  (02/27/2009)   Hypertension self-care plan printed.    Lipid self-management support: Copy of home glucose meter record, Written self-care plan  (02/27/2009)   Lipid self-care plan printed.   Nursing Instructions: Diabetic foot exam today Give Pneumovax today Provide Hemoccult cards with instructions (see order)    Diabetic Foot Exam Foot Inspection Is there a history of a foot ulcer?              No Is there a foot ulcer now?              No Can the patient see  the bottom of their feet?          No Are the shoes appropriate in style and fit?          No Is there swelling or an abnormal foot shape?          No Are the toenails long?                No Are the toenails thick?                No Are the toenails ingrown?              No Is there heavy callous build-up?              No Is there pain in the calf muscle (Intermittent claudication) when walking?    NoIs there a claw toe deformity?              No Is there elevated skin temperature?            No Is there limited ankle dorsiflexion?            No Is there foot or ankle muscle weakness?            No  Diabetic Foot Care Education Patient educated on appropriate care of diabetic feet.  Pulse Check          Right Foot          Left Foot Posterior Tibial:        normal            normal Dorsalis Pedis:        normal            normal Comments: Callus on heels High Risk Feet? Yes Set Next Diabetic Foot Exam here: 05/28/2009   10-g (5.07) Semmes-Weinstein Monofilament Test Performed by: Filomena Jungling NT II          Right Foot          Left Foot Site 1         normal         normal Site 2         normal         normal Site 3         normal         normal Site 4         normal         normal Site 5         normal         normal Site 6         normal          normal Site 7         normal         normal Site 8         normal         normal Site 9         normal         normal  Impression      normal         normal   Immunization History:  Influenza Immunization History:    Influenza:  historical (11/14/2008)  Immunizations Administered:  Pneumonia Vaccine:    Vaccine Type: Pneumovax    Site: right deltoid    Mfr: Merck    Dose: 0.5 ml    Route: IM    Given by: Stanton Kidney  Ditzler RN    Exp. Date: 05/23/2010    Lot #: [id number]    VIS given: 08/31/95 version given February 27, 2009.  Laboratory Results   Blood Tests   Date/Time Received: February 27, 2009 11:58 AM  Date/Time Reported: Burke Keels  February 27, 2009 11:58 AM   HGBA1C: 6.6%   (Normal Range: Non-Diabetic - 3-6%   Control Diabetic - 6-8%) CBG Random:: 77mg /dL

## 2010-03-04 NOTE — Letter (Signed)
Summary: ADVANCED CMN ORDER  ADVANCED CMN ORDER   Imported By: Shon Hough 10/17/2009 16:48:29  _____________________________________________________________________  External Attachment:    Type:   Image     Comment:   External Document

## 2010-03-06 NOTE — Miscellaneous (Signed)
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

## 2010-03-06 NOTE — Miscellaneous (Signed)
Summary: [place] DEPRT FOR PCS IN HOME AIDE  [place] DEPRT FOR PCS IN HOME AIDE   Imported By: Shon Hough 01/16/2010 14:26:27  _____________________________________________________________________  External Attachment:    Type:   Image     Comment:   External Document

## 2010-03-06 NOTE — Consult Note (Signed)
Summary: [facility]   Imported By: Shon Hough 01/21/2010 15:22:24  _____________________________________________________________________  External Attachment:    Type:   Image     Comment:   External Document  Appended Document: [facility]   Diabetic Eye Exam  Procedure date:  01/06/2010  Findings:      No diabetic retinopathy. Legal blindness. Exam by Michel Harrow, MD  Procedures Next Due Date:    Diabetic Eye Exam: 01/2011

## 2010-04-09 ENCOUNTER — Ambulatory Visit (INDEPENDENT_AMBULATORY_CARE_PROVIDER_SITE_OTHER): Payer: PRIVATE HEALTH INSURANCE | Admitting: Internal Medicine

## 2010-04-09 ENCOUNTER — Ambulatory Visit: Payer: Self-pay | Admitting: Internal Medicine

## 2010-04-09 ENCOUNTER — Encounter: Payer: Self-pay | Admitting: Internal Medicine

## 2010-04-09 DIAGNOSIS — M79609 Pain in unspecified limb: Secondary | ICD-10-CM

## 2010-04-09 DIAGNOSIS — G4730 Sleep apnea, unspecified: Secondary | ICD-10-CM

## 2010-04-09 DIAGNOSIS — I10 Essential (primary) hypertension: Secondary | ICD-10-CM

## 2010-04-09 DIAGNOSIS — M79671 Pain in right foot: Secondary | ICD-10-CM | POA: Insufficient documentation

## 2010-04-09 DIAGNOSIS — M79642 Pain in left hand: Secondary | ICD-10-CM

## 2010-04-09 DIAGNOSIS — N189 Chronic kidney disease, unspecified: Secondary | ICD-10-CM

## 2010-04-09 DIAGNOSIS — E119 Type 2 diabetes mellitus without complications: Secondary | ICD-10-CM

## 2010-04-09 LAB — BASIC METABOLIC PANEL
BUN: 27 mg/dL — ABNORMAL HIGH (ref 6–23)
CO2: 23 mEq/L (ref 19–32)
Chloride: 104 mEq/L (ref 96–112)
Creat: 1.41 mg/dL — ABNORMAL HIGH (ref 0.40–1.20)
Glucose, Bld: 160 mg/dL — ABNORMAL HIGH (ref 70–99)
Potassium: 4.4 mEq/L (ref 3.5–5.3)

## 2010-04-09 LAB — POCT GLYCOSYLATED HEMOGLOBIN (HGB A1C): Hemoglobin A1C: 8

## 2010-04-09 MED ORDER — ATENOLOL 25 MG PO TABS: 50.0000 mg | ORAL_TABLET | Freq: Two times a day (BID) | ORAL | Status: DC

## 2010-04-09 MED ORDER — GLIPIZIDE ER 5 MG PO TB24: 10.0000 mg | ORAL_TABLET | Freq: Every day | ORAL | Status: DC

## 2010-04-09 NOTE — Assessment & Plan Note (Signed)
Patient reports intermittent left hand pain, and reports that a needle broke off in her hand when she was 65 years old and has never been removed.  Her exam today is unremarkable.  Plan is to obtain an XR of her hand.

## 2010-04-09 NOTE — Assessment & Plan Note (Signed)
Lab Results  Component Value Date   HGBA1C 8.0 04/09/2010   HGBA1C 6.7 11/26/2009   CREATININE 1.41* 04/09/2010   CREATININE 1.57* 11/26/2009   MICROALBUR 8.32* 11/26/2009   MICRALBCREAT 133.5* 11/26/2009   CHOL 91 05/01/2009   LDL 39 05/01/2009   HDL 35* 05/01/2009   TRIG 83 05/01/2009    Last eye exam and foot exam:    Component Value Date/Time   HMDIABEYEEXA no diabetic retinopathy; legal blindness- DrWynona Canes McCuen 01/06/2010   HMDIABFOOTEX done 11/26/2009    Assessment: Diabetes control: not controlled Progress toward goals: deteriorated Barriers to meeting goals: visual impairment  Plan: Diabetes treatment: Increase glipizide XL 5 mg to a dose of 2 tablets daily. Refer to: diabetes educator for self-management training Instruction/counseling given: reminded to bring blood glucose meter & log to each visit, reminded to bring medications to each visit and discussed foot care

## 2010-04-09 NOTE — Assessment & Plan Note (Signed)
Lab Results  Component Value Date   CREATININE 1.41* 04/09/2010   CREATININE 1.57* 11/26/2009   CREATININE 1.50* 05/01/2009   CREATININE 1.50* 11/14/2008    Plan is to check a metabolic panel today.

## 2010-04-09 NOTE — Assessment & Plan Note (Signed)
Lab Results  Component Value Date   NA 144 04/09/2010   K 4.4 04/09/2010   CL 104 04/09/2010   CO2 23 04/09/2010   BUN 27* 04/09/2010   CREATININE 1.41* 04/09/2010   CREATININE 1.57* 11/26/2009    BP Readings from Last 3 Encounters:  04/09/10 161/78  11/26/09 159/76  05/01/09 128/71    Assessment: Hypertension control:  moderately elevated  Progress toward goals:  unchanged Barriers to meeting goals:  visual impairment  Plan: Hypertension treatment:  Increase atenolol 25 mg to a dose of 2 tablets twice a day.

## 2010-04-09 NOTE — Assessment & Plan Note (Signed)
The cause of her episode of right foot pain and swelling is not clear; this was possibly a gout attack, now resolved, although patient has no history of gout.  Plan is to obtain an XR of her right foot.  I advised her to return if her symptoms recur.

## 2010-04-09 NOTE — Patient Instructions (Addendum)
Increase glipizide XL 5 mg to a dose of 2 tablets daily. Increase atenolol 25 mg to a dose of 2 tablets twice a day. Please schedule a follow-up appointment with Joan Banks.

## 2010-04-09 NOTE — Progress Notes (Signed)
  Subjective:    Patient ID: Joan Banks, female    DOB: [birth year], 65 y.o.   MRN: [id number]  HPI Patient returns for follow-up of her diabetes mellitus, hypertension, COPD, and other chronic medical problems.  She reports an episode of swelling and pain of her right ankle and foot about one month ago; her symptoms improved, but she still has pain at the base of her right great toe when walking.  She also reports intermittent chronic pain in her left hand, and reports an injury at age 34 in which a needle broke off in her hand and was never removed.  She is doing well on home O2 and is compliant with her CPAP.  She reports that she is taking her medications as prescribed.  Review of Systems  Constitutional: Negative for fever, chills and diaphoresis.  Respiratory: Negative for cough, shortness of breath and wheezing.   Cardiovascular: Negative for chest pain.  Gastrointestinal: Negative for nausea, vomiting and abdominal pain.       Objective:   Physical Exam  Constitutional: No distress.  Cardiovascular: Normal rate and regular rhythm.   Murmur heard.  Systolic murmur is present with a grade of 1/6  Pulmonary/Chest: Effort normal and breath sounds normal. No respiratory distress. She has no wheezes. She has no rales.  Musculoskeletal:       Left hand: She exhibits no tenderness, no bony tenderness, no deformity and no swelling.       Right foot: She exhibits no tenderness, no bony tenderness, no swelling and no deformity.          Assessment & Plan:

## 2010-04-25 LAB — GLUCOSE, CAPILLARY: Glucose-Capillary: 150 mg/dL — ABNORMAL HIGH (ref 70–99)

## 2010-05-04 ENCOUNTER — Other Ambulatory Visit: Payer: Self-pay | Admitting: Internal Medicine

## 2010-05-08 LAB — GLUCOSE, CAPILLARY: Glucose-Capillary: 183 mg/dL — ABNORMAL HIGH (ref 70–99)

## 2010-05-11 LAB — GLUCOSE, CAPILLARY: Glucose-Capillary: 104 mg/dL — ABNORMAL HIGH (ref 70–99)

## 2010-05-15 LAB — GLUCOSE, CAPILLARY: Glucose-Capillary: 162 mg/dL — ABNORMAL HIGH (ref 70–99)

## 2010-06-04 ENCOUNTER — Encounter: Payer: Self-pay | Admitting: Internal Medicine

## 2010-06-04 ENCOUNTER — Ambulatory Visit (INDEPENDENT_AMBULATORY_CARE_PROVIDER_SITE_OTHER): Payer: PRIVATE HEALTH INSURANCE | Admitting: Internal Medicine

## 2010-06-04 VITALS — BP 145/77 | HR 80 | Temp 98.6°F | Ht 63.5 in | Wt 256.8 lb

## 2010-06-04 DIAGNOSIS — I10 Essential (primary) hypertension: Secondary | ICD-10-CM

## 2010-06-04 DIAGNOSIS — J449 Chronic obstructive pulmonary disease, unspecified: Secondary | ICD-10-CM

## 2010-06-04 DIAGNOSIS — E119 Type 2 diabetes mellitus without complications: Secondary | ICD-10-CM

## 2010-06-04 LAB — GLUCOSE, CAPILLARY: Glucose-Capillary: 281 mg/dL — ABNORMAL HIGH (ref 70–99)

## 2010-06-04 MED ORDER — ZOSTER VACCINE LIVE 19400 UNT/0.65ML ~~LOC~~ SOLR: 0.6500 mL | Freq: Once | SUBCUTANEOUS | Status: AC

## 2010-06-04 NOTE — Progress Notes (Signed)
  Subjective:    Patient ID: Joan Banks, female    DOB: 04-07-1945, 65 y.o.   MRN: [id number]  HPI Patient returns for follow-up of her diabetes mellitus, hypertension,and other chronic medical problems.  She has no acute complaints today.  She reports that she has been compliant with her medications.  She has not been checking her blood sugars recently because of problems with her glucose meter.  Her breathing has been stable without any recent acute exacerbations.   Review of Systems  Respiratory: Negative for cough, shortness of breath and wheezing.   Cardiovascular: Positive for leg swelling (Mild pedal edema). Negative for chest pain.  Gastrointestinal: Negative for nausea, vomiting and abdominal pain.  Genitourinary: Negative for frequency.       Objective:   Physical Exam  Constitutional: No distress.  Cardiovascular: Normal rate, regular rhythm, S1 normal and S2 normal.  Exam reveals no gallop, no S3 and no S4.   Murmur heard.  Systolic murmur is present with a grade of 2/6       Mild pedal edema  Pulmonary/Chest: Effort normal and breath sounds normal. No respiratory distress. She has no wheezes. She has no rales.  Abdominal: Soft. Bowel sounds are normal. There is no tenderness.        Assessment & Plan:

## 2010-06-04 NOTE — Assessment & Plan Note (Signed)
Lab Results  Component Value Date   NA 144 04/09/2010   K 4.4 04/09/2010   CL 104 04/09/2010   CO2 23 04/09/2010   BUN 27* 04/09/2010   CREATININE 1.41* 04/09/2010   CREATININE 1.57* 11/26/2009    BP Readings from Last 3 Encounters:  06/04/10 145/77  04/09/10 161/78  11/26/09 159/76    Assessment: Hypertension control:  mildly elevated  Progress toward goals:  improved Barriers to meeting goals:  visual impairment  Plan: Hypertension treatment:  continue current medications

## 2010-06-04 NOTE — Assessment & Plan Note (Signed)
Lab Results  Component Value Date   HGBA1C 8.0 04/09/2010   HGBA1C 6.7 11/26/2009   CREATININE 1.41* 04/09/2010   CREATININE 1.57* 11/26/2009   MICROALBUR 8.32* 11/26/2009   MICRALBCREAT 133.5* 11/26/2009   CHOL 91 05/01/2009   HDL 35* 05/01/2009   TRIG 83 05/01/2009    Last eye exam and foot exam:    Component Value Date/Time   HMDIABEYEEXA no diabetic retinopathy; legal blindness- DrWynona Canes McCuen 01/06/2010   HMDIABFOOTEX done 11/26/2009    Assessment: Diabetes control: not controlled Progress toward goals: improved Barriers to meeting goals: visual impairment  Plan: Diabetes treatment: continue current medications Refer to: diabetes educator for self-management training Instruction/counseling given: reminded to bring blood glucose meter & log to each visit and discussed foot care

## 2010-06-04 NOTE — Assessment & Plan Note (Signed)
Patient has stable respiratory status on current regimen.  The plan is to continue as before.

## 2010-06-04 NOTE — Patient Instructions (Signed)
Continue current medications. Please make an appointment with diabetes educator Jamison Neighbor for follow-up.

## 2010-06-06 ENCOUNTER — Telehealth: Payer: Self-pay | Admitting: *Deleted

## 2010-06-06 NOTE — Telephone Encounter (Signed)
Talked to R/A Bess - pt needs to pay $200.00 some dollars - form needs to be completed by pt and pharm. Pt will get money back except $55.00. Pt aware - unable to afford vaccine. Stanton Kidney Devantae Babe RN 06/06/10 3:40PM

## 2010-06-09 ENCOUNTER — Other Ambulatory Visit: Payer: Self-pay | Admitting: Dietician

## 2010-06-09 MED ORDER — ACCU-CHEK COMPACT PLUS CARE KIT: PACK | Status: DC

## 2010-06-09 MED ORDER — GLUCOSE BLOOD VI STRP: ORAL_STRIP | Status: DC

## 2010-06-09 MED ORDER — ACCU-CHEK SOFTCLIX LANCET DEV MISC: Status: DC

## 2010-06-09 NOTE — Telephone Encounter (Signed)
[facility]an [facility] at [facility] calls for verbal order for new 4 wheeled walker for pt, pt's present walker is broken.  [place], [facility], [phone]

## 2010-06-09 NOTE — Telephone Encounter (Signed)
Spoke with patient- she has trouble getting to office due to transportation I also spoke to her insurance company: she pays 0 for diabetes testing supplies covered by state Medicaid if she has it which she does. Patient desires [employer] Compact plus meter, strip and lancets. I explained other over the phone how the meter worked and to listen for the audible beeps to signify strip ready, strip with enough blood and results. Patient feels she will be able to to figure it out.  Request rxs be sent to [facility] on [address] per patient agreement. If her insurance does not cover the meter instructed her to call CDE. Discard mail order testing supply company requests.

## 2010-06-13 ENCOUNTER — Telehealth: Payer: Self-pay | Admitting: Dietician

## 2010-06-13 NOTE — Telephone Encounter (Signed)
Patient called to say she could not afford meter, strips  Called her pharmacy: her [employer]  part D is covering but Medicaid is not despite the Rx being for one of their covered meters: pharmacist will call [employer]  And I contacted [employer] rep who was able to solve for other pharmacies.    Current cost per Tyler Aas is : $ 21.21  for strips, lancets- 2.05, meter 3.48 and it unaffordable.

## 2010-06-13 NOTE — Telephone Encounter (Signed)
Spoke with patient, [employer] reps and pharmacist again regarding Joan Banks's strip cost: turns out her Medicare ADVANTAGE plan is not allowing strips to be billed to Medicare B like they are usually, but makes them be billed to D. When strips billed to Medicare D, then Medicaid will not pick up the copy of 20%. Patient advised to either call Medicare and change plans (likely Medicare and Medicaid best option for her and this was discussed with patient today)  which often low subsidy patient can do midyear or use mail order.   Offered social work referral to assist patient with changing plans and  Also senior resources number. Patient did not want to do either at this time and said she'd call back.

## 2010-06-20 NOTE — Consult Note (Signed)
[facility]  Patient:    Joan Banks, Joan Banks             MRN: [id number] Proc. Date: 01/14/00 Adm. Date:  [id number] Disc. Date: [id number] Attending:  Jeannette Corpus CC:         Bing Neighbors. Clearance Coots, M.D.  Lowell C. Catha Gosselin, M.D.  Telford Nab, R.N.   Consultation Report  HISTORY:  [age]-year-old African-American female returns for wound check and six-week postoperative check.  The patient underwent a total abdominal hysterectomy and bilateral salpingo-oophorectomy on October 16th.  She had a wound separation which has been healing with medical management.  Currently, she denies any GI or GU symptoms.  Her functional status is considerably improved and nearly normal.  She denies any fever or chills.  PHYSICAL EXAMINATION  VITAL SIGNS:  Weight 264 pounds.  Blood pressure 130/70.  GENERAL:  Patient is a pleasant woman in no acute distress.  HEENT:  Negative.  NECK:  Supple without thyromegaly.  NODES:  There is no supraclavicular or inguinal adenopathy.  ABDOMEN:  The abdomen is obese, soft, nontender.  No mass, organomegaly, ascites or herniae are noted.  Her midline incision is granulating beautifully and is about 1-cm wide and 6-cm long with minimal depth.  The granulation tissue is very healthy.  PELVIC:  EG/BUS normal.  Vagina is clean and well-supported.  Cuff has healed nicely.  Bimanual and rectovaginal exam reveal no masses, induration or nodularity.  IMPRESSION:  Good postoperative recovery following total abdominal hysterectomy/bilateral salpingo-oophorectomy.  Patient is given the okay to return to full levels of activity.  With regard to wound care, we will discontinue home health nursing.  She will continue to keep the wound clean and put a dressing over it until it stops draining.  She will return to the gynecologic clinic at [facility] on an annual basis. DD:  01/14/00 TD:  01/14/00 Job:  [id number]

## 2010-06-20 NOTE — Discharge Summary (Signed)
[facility]  Patient:    Joan Banks, Joan Banks             MRN: [id number] Adm. Date:  [id number] Disc. Date: [id number] Attending:  Ronita Hipps T CC:         Jetty Duhamel, M.D.  Farley Ly, M.D.  Lowell C. Catha Gosselin, M.D.   Discharge Summary  ADMITTING DIAGNOSIS:          Huge leiomyomata uteri.  DISCHARGE DIAGNOSES:          1. Huge leiomyomata uteri.                               2. Status post total abdominal hysterectomy.                               3. Right salpingo-oophorectomy.                               4. Debulking of fibroids.                               5. Lysis of adhesions.                               6. Postoperative wound separation from seroma.                               7. New onset diabetes, stable.  REASON FOR ADMISSION:         A 65 year old black female, referred from the [facility], to Dr. De Blanch for preoperative evaluation of huge uterine fibroid.  The patient was quite symptomatic because of the large pelvic abdominal mass and was unable to bend over to tie her shoes and, consequently, had quite a protuberant abdomen with a considerable amount of abdominal pressure and discomfort.  In 1967, she underwent a uterine myomectomy.  A large mass in the abdomen was recently identified, and the patient underwent an MRI of the abdomen and pelvis which revealed a 28 x 22 x 17 cm mass arising from the pelvis and extending into the upper abdomen.  This was thought to have characteristics of leiomyomata uterus, although ovarian fibroma could be considered a possibility.  The patient is menopausal and is not having any vaginal bleeding.  Pap smear history was within normal limits.  PAST MEDICAL HISTORY:  SURGERY:                      1. Abdominal myomectomy in 1967.                               2. Lumpectomy for intraductal breast carcinoma  in 1999.  ILLNESSES:                    1. Chronic hypertension.                               2. Congestive heart failure, stable.  3. Anemia.                               4. Gastritis.                               5. Breast cancer.                               6. New onset diabetes.  MEDICATIONS:                  1. Atenolol 25 mg q.d.                               2. Norvasc 10 mg q.d.                               3. Lasix 20 mg 2 tablets q.d.                               4. ______  10 mg b.i.d.  ALLERGIES:                    No known drug allergies.  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  She has never been pregnant.  She smokes 1/2-pack-per-day, denies alcohol or recreational drug use, but the patient does have a history per previous chart review of alcohol abuse in the past.  FAMILY HISTORY:               Negative for gynecologic, breast, or colon cancer.  PHYSICAL EXAMINATION:  VITAL SIGNS:                  Weight 281 lbs., height 54", blood pressure 146/74, pulse 96, respiratory rate 20.  GENERAL:                      The patient is a pleasant African-American female in no acute distress.  HEENT:                        Scarring of the right eye.  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  ABDOMEN:                      Obese and massively distended.  There is a palpable mass extending about 10 cm above the umbilicus.  The mass fills the whole lower abdomen.  PELVIC:                       Normal external female genitalia.  The vagina was atrophic and clean.  Cervix difficult to visualize but palpates to be normal.  The uterus was difficult to distinguish from the mass which fills the pelvis and extends into the upper abdomen.  Rectovaginal confirms.  The patient also has condylomata of the anus.  IMPRESSION:                   1. Huge abdominal mass, most likely  uterine                                   fibroids which are symptomatic.  It was                                  recommended to the patient that she underwent                                  exploratory laparotomy, total abdominal                                  hysterectomy, and bilateral                                  salpingo-oophorectomy.                               2. Condyloma of the anus.  These could be                                  excised or cauterized at the time of her                                  abdominal surgery.  The risks of surgery including hemorrhage, infection, injury to adjacent viscera, and thromboembolic complications, and wound dehiscence were all outlined to the patient.  She was aware of these risks and accepts the risks and wished to proceed with surgery.  LABORATORY VALUES:            Hemoglobin 11.9, hematocrit 38, platelets 217,000, white blood cell count 7600, glucose 210, creatinine 0.7, BUN 17, sodium 138, potassium 3.6, AST 22, ALT 13.  Blood type A+.  Chest x-ray revealed cardiomegaly unchanged from previous x-rays, atelectasis within the lung bases.  An EKG revealed normal sinus rhythm, normal EKG.  A 2-D echocardiogram with Doppler studies revealed an estimated ejection fraction of 60-65%.  CONSULTATION:                 With the patients medical physician, Dr. Margarito Liner, and the patient was medically cleared for surgery.  HOSPITAL COURSE:              The patient underwent total abdominal hysterectomy, right salpingo-oophorectomy, and debulking of benign fibroid on November 18, 1999.  The procedure technically was complicated by poor visualization because of the massive pelvic and abdominal mass, and there was increased blood loss because of the parasitic nature of the blood supply to the fibroids.  The patients postoperative course was complicated by wound  separation from seroma formation, and the wound was opened, and a few areas of bleeding were cauterized, and  the wound was managed by wet and dry packing the remainder of the hospital course.  The patients postoperative course was otherwise uncomplicated.  She was discharged home on postoperative day #6 in good condition.  DISCHARGE DISPOSITION:  MEDICATIONS:  1. Continue preoperative medications.                               2. Percocet 1-2 tablets p.o. q.4h. as needed for                                  pain.                               3. Prescription was written for a ______  kit                                  with supplies and diabetic needles and chem                                  strips.  INSTRUCTIONS:                 The patient was given routine written instructions for activity and diet and wound management.  FOLLOW-UP:                    She will follow up with Dr. De Blanch at the GYN oncology teaching service at [facility] on November 26, 1999, at 11:30 a.m., and she will also follow up with Dr. Margarito Liner at the [facility] on December 03, 1999, at 11 a.m.  DISCHARGE LABORATORY VALUES:  Hemoglobin 8.9, hematocrit 26.8, platelets 256,000, white blood cell count 11,200.  ADDENDUM:                     The patient did receive transfusion for anemia from the blood loss during surgery, and the postoperative discharge laboratory represents the hemoglobin after transfusion of blood products. DD:  11/24/99 TD:  11/24/99 Job: [id number]

## 2010-06-20 NOTE — Consult Note (Signed)
[place]  Patient:    Joan Banks, Joan Banks             MRN: [id number] Proc. Date: 12/16/99 Adm. Date:  [id number] Attending:  Jeannette Corpus CC:         Telford Nab, R.N.   Consultation Report  HISTORY OF PRESENT ILLNESS:  A 65 year old returns for wound check, having had a wound separation following abdominal hysterectomy on November 08, 1999.  She has been using calcium algenate in the wound.  She denies any abdominal pain or pressure or any GI or GU symptoms.  Her functional status continues to improve since surgery.  Her abdomen is obese and soft.  The incision is healing down to the lower portion, which is now closing in nicely.  It has excellent appearing granulation tissue throughout.  There is no evidence of necrosis.  The wound now measures approximately 5 x 4 x 3 cm deep.  PLAN:  The patient will continue her current regimen.  I will plan on seeing back again in 3 to 4 weeks, at which time I would hope that her wound would nearly be closed.  All of her questions are answered. DD:  12/16/99 TD:  12/16/99 Job: [id number]

## 2010-06-20 NOTE — Procedures (Signed)
[place]  Patient:    Joan Banks, Joan Banks             MRN: [id number] Proc. Date: 12/02/99 Adm. Date:  [id number] Attending:  Jeannette Corpus                           Procedure Report  HISTORY:  This 65 year old African-American female who underwent TAH BSO for large uterine fibroids on October 16.  The lower pole of the wound developed a seroma and separated and has been under wound care.  She returns today for wound check.  She denies any fever or chills and says that her home health nurse feels the wound is closing nicely.  PHYSICAL EXAMINATION:  ABDOMEN:  The abdomen is soft and nontender.  The upper portion of the midline incision is healing well.  All staples and subcutaneous retention sutures are removed.  PROCEDURE:  The lower portion of the wound is opened, and dressings are removed.  It is granulating well from top to bottom and indeed is closing in. The wound is redressed with calcium alginate and dry packing.  I will have the patient return to see [place] in two weeks for continuing followup.  She may shower over this wound as it stands now. DD:  12/02/99 TD:  12/02/99 Job: [id number]

## 2010-06-20 NOTE — Op Note (Signed)
[facility]  Patient:    Joan Banks, Joan Banks             MRN: [id number] Proc. Date: 11/18/99 Adm. Date:  [id number] Attending:  Ronita Hipps T CC:         Ellis Parents, M.D.             Telford Nab, R.N.                           Operative Report  PREOPERATIVE DIAGNOSIS:  Leiomyomata uteri, massive.  POSTOPERATIVE DIAGNOSIS:  Leiomyomata uteri, massive.  PROCEDURE:  Total abdominal hysterectomy, right salpingo-oophorectomy, and debulking of benign fibroid from vesicouterine space.  SURGEON:  John T. Kyla Balzarine, M.D.  ASSISTANT:  Ellis Parents, M.D.  ANESTHESIA:  General endotracheal.  FINDINGS:  Examination under anesthesia revealed a massive, essentially 40-week size mass filling the abdomen.  Upon opening the abdomen, there was a 30+ cm leiomyoma complex of the anterior surface of the fibroid uterus, deviating the bladder anteriorly with a large amount of parasitic blood supply from the bladder.  There was no attachment to omentum or other abdominal structures.  Frozen section revealed probable benign smooth muscle tumor. Ureters were normal and unimpeded at the end of the surgery.  DESCRIPTION OF PROCEDURE:  The patient was placed in low lithotomy position using Allen stirrups after examination under anesthesia revealed the findings described above.  A Foley catheter was placed, and she was explored through a midline incision which extended essentially from the sternum to the pubis. Manual and visual exploration revealed the findings described above.  A Buchwalter retractor was placed, and a lengthy dissection was performed, stepwise in attempting to gain visualization as the procedure proceeded.  The peritoneal attachments to the large anterior fibroid were taken down sharply and with electrocautery for hemostasis.  The right utero-ovarian ligament and tube were isolated, crossclamped, divided, and ligated with a free tie and suture  ligature.  All ligatures were 2-0 Vicryl unless otherwise specified. On the left side, the patient was post prior left salpingo-oophorectomy.  The retroperitoneal space on that side was partially developed, uterine vessels clamped adjacent to the lower uterine segment, divided, and suture ligated. Using sharp and blunt dissection, the vesicouterine space was dissected, between bladder and fibroid.  As large veins and arteries were encountered as part of the parasitic blood supply to the fibroid, these were isolated, crossclamped, divided, and ligated.  Eventually, the bladder was advanced off of the dominant fibroid.  At this time, it was elected to perform myomectomy. Using Mayo scissors and scalpel, the dominant fibroid was morcellated off of the anterior surface of the uterus.  The uterus was upper limits size with a 6 cm lower segment fibroid originating from the right lower segment.  Using sharp and blunt dissection, the bladder flap was further advanced off of the cervix and upper vagina.  The right uterine vessels were skeletonized, crossclamped, divided, and suture ligated.  Alternating on the left and right side, additional parametrial pedicles were developed by clamping adjacent to the cervix, dividing, and suture ligating.  The upper vagina was crossclamped below the level of the cervix and the specimen amputated.  The vaginal cuff was closed with a locked running suture line of 0 Vicryl.  The retroperitoneal spaces bilaterally were opened and ureters identified.  On the right side, the infundibulopelvic ligament was isolated, divided, and ligated, amputating the ovary and tube, which had been left behind during  hysterectomy.  Each ureter was traced to the ureteric tunnel and was found to be free of jeopardy from the parametrial and uterine pedicles.  Additional hemostasis was achieved where necessary using electrocautery, and the pelvis was copiously irrigated. The abdominal wall  was closed in layers after removing all sponges and retractors.  The midline incision was closed with running Smead-Jones closure of #1 PDS and skin with skin clips after irrigating thoroughly the subcutaneous space.  Large retention sutures of #1 nylon were placed and tied down over bridges.  The patient tolerated the procedure well and was returned to the recovery room in stable condition.  Estimated blood loss 1500 cc. Transfusions:  One unit.  Sponge and instrument counts were correct.  Drains, packs, etc.:  Foley to bag drainage. DD:  11/18/99 TD:  11/19/99 Job: [id number]

## 2010-06-20 NOTE — Op Note (Signed)
[place]  Patient:    Joan Banks, Joan Banks                  MRN: [id number] Proc. Date: 07/14/98 Adm. Date:  [id number] Disc. Date: [id number] Attending:  Sonda Primes CC:         Two copies to Dr. Lurene Shadow                           Operative Report  PREOPERATIVE DIAGNOSIS:  Lipoma, right shoulder posteriorly.  POSTOPERATIVE DIAGNOSIS:  Lipoma, right shoulder posteriorly.  OPERATION:  Excision of lipoma, right shoulder measured as 6 cm x 4 cm.  SURGEON:  Mardene Celeste. Lurene Shadow, M.D.  ASSISTANTRubye Oaks, P.A.-Student  ANESTHESIA:  MAC. I used 1% xylocaine with epinephrine.  NOTE: This patient is a 65 year old woman with enlarging mass over the posterior aspect of the right shoulder just at the scapular spine.  She is brought to the operating room now for excision.  DESCRIPTION OF PROCEDURE:  Following in the introduction of satisfactory sedation, the patient was placed in the left lateral recumbent position and the mass over the right shoulder is prepped and draped to be included in the sterile operative field. I used a vertical incision placed in the anterior posterior axis. This was deepened through the skin and subcutaneous tissue down to the capsule of the lipoma. The lipoma was dissected free on all sides using both sharp and blunt dissection and it was removed in its entirety. This was forwarded for pathologic evaluation. Hemostasis was assured with electrocautery. Sponge and instruments were checked out and counts verified. The wounds closed in layers as follows: Subcutaneous tissue was closed with interrupted 3-0 Vicryl sutures. Skin closed with a running 4-0 Monocryl suture and then reinforced with Steri-Strips.  Sterile dressing was applied. Anesthetic was reversed. The patient was removed from the operating room to the recovery room in stable condition having tolerated the procedure well. DD:  07/14/99 TD:  07/16/99 Job:  [id number]

## 2010-06-20 NOTE — H&P (Signed)
[facility]  Patient:    Joan Banks, FOGAL                  MRN: [id number] Adm. Date:  [id number] Attending:  Jeannette Corpus CC:         Telford Nab, R.N.  Bing Neighbors. Clearance Coots, M.D.  Ellis Parents, M.D.   History and Physical  GYN ONCOLOGY PROGRAM  HISTORY:  The patient is a 65 year old black female referred by Dr. Coral Ceo for preoperative evaluation.  The patient has had a long standing history of uterine fibroids, which have become quite large and symptomatic. The patient is currently unable to bend over to tie her shoes and has quite a protuberant abdomen with a considerable amount of abdominal pressure and discomfort.  In 1967, she underwent uterine myomectomy.  A large mass in the abdomen was recently identified.  The patient has undergone an MRI of the abdomen and pelvis showing a 28 x 22 x 17 cm mass arising from the pelvis and extending into the upper abdomen.  This is thought to have characteristics of a leiomyomatous uterus, although ovarian fibroma could be considered a possibility.  The patient is menopausal and is not having any vaginal bleeding.  PAST MEDICAL HISTORY:  Chronic hypertension, congestive heart failure (stable), anemia, gastritis, and breast cancer.  PAST SURGICAL HISTORY:  Abdominal myomectomy, lumpectomy for intraductal breast carcinoma in 1999.  CURRENT MEDICATIONS:  Atenolol 25 mg q.d.  Norvasc 10 mg q.d.  Lasix 20 mg, two tablets q.d.  Novodex 10 mg b.i.d.  DRUG ALLERGIES:  None.  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  She has never been pregnant.  She smokes 1/2 pack per day.  FAMILY HISTORY:  Negative for gynecologic, breast or colon cancer.  PHYSICAL EXAMINATION:  VITAL SIGNS:  Weight 281 pounds, height 5 feet 4 inches, blood pressure 146/74, pulse 96, respiratory rate 20.  GENERAL:  The patient is a pleasant African-American female in no  acute distress.  HEENT:  Scarring of the right eye.  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  ABDOMEN:  Obese and massively distended.  There is a palpable mass extending about 10 cm above the umbilicus.  The mass fills the whole lower abdomen.  PELVIC:  EG/BUS normal.  The vagina is atrophic and clean.  The cervix is difficult to visualize but palpates to be normal.  I cannot distinguish the uterus from the mass, which fills the pelvis and extends into the upper abdomen.  Rectovaginal confirms.  The patient also has condylomata of the anus.  IMPRESSION: 1. Huge abdominal mass, most likely uterine fibroids, which are symptomatic.    I agree with Dr. Thomes Lolling recommendation that the patient should undergo    exploratory laparotomy, total abdominal hysterectomy and bilateral    salpingo-oophorectomy. 2. Condylomata of the anus.  These may be excised or cauterized at the time of    her abdominal surgery.  The risks of surgery including hemorrhage, infection, injury to adjacent viscera and thromboembolic complications, wound dehiscence and infection are all outlined to the patient.  She is aware of these risks and accepts them and wishes to proceed with surgery scheduled for next week.  She is aware that Dr. Ronita Hipps will participate in the surgery along with Dr. Ellis Parents. DD:  11/04/99 TD:  11/04/99 Job: [id number]

## 2010-06-20 NOTE — Op Note (Signed)
[place]  Patient:    AVONDA, TOSO Visit Number: [id number] MRN: [id number]          Service Type: DSU Location: [facility] [id number] Attending Physician:  Sonda Primes Dictated by:   Mardene Celeste Lurene Shadow, M.D. Proc. Date: 04/14/01 Admit Date:  04/14/2001                             Operative Report  PREOPERATIVE DIAGNOSIS:  Lipoma, right forearm.  POSTOPERATIVE DIAGNOSIS:  Lipoma, right forearm.  OPERATION PERFORMED:  Excision, lipoma, right forearm.  SURGEON:  Mardene Celeste. Lurene Shadow, M.D.  ASSISTANT:  Nurse.  ANESTHESIA:  General.  INDICATIONS FOR PROCEDURE:  The patient presents with an enlarging mass on the palmar side of the right forearm which has been causing some pressure and pain.  She was brought to the operating room now after the risks and benefits of surgery have been discussed and she gives consent.  DESCRIPTION OF PROCEDURE:  Following the induction of satisfactory general anesthesia, the patient was positioned supinely.  The right arm was extended laterally.  The arm was prepped and draped to be included in the sterile operative field.  I infiltrated the region of the tumor with 1% Xylocaine with epinephrine.  Made a vertical incision over the mass and deepened this through the skin and subcutaneous tissue down to the capsule of the lipoma.  The lipoma was then dissected out on all sides and removed  in its entirety. Hemostasis assured with electrocautery.  Sponge, instrument and sharp counts were verified.  The wound closed as follows.  3-0 Vicryl sutures used to close the subcutaneous fat and a 4-0 Monocryl used to close the skin with a subcuticular stitch.  This was reinforced with Steri-Strips.  Sterile dressing applied.  Anesthetic reversed.  Patient removed from the operating room to the recovery room in stable condition. Dictated by:   Mardene Celeste. Lurene Shadow, M.D. Attending Physician:  Sonda Primes DD:  04/14/01 TD:  04/14/01 Job: [id number]

## 2010-06-20 NOTE — Op Note (Signed)
[facility]  Patient:    Joan Banks, Joan Banks             MRN: [id number] Proc. Date: 11/18/99 Adm. Date:  [id number] Attending:  Ronita Hipps T                           Operative Report  ADDENDUM:  Condylomata were present encircling the anus and were removed using electrocautery to excise the base of the dependent condylomata into individually coagulate perianal condylomas. DD:  11/18/99 TD:  11/19/99 Job: [id number]

## 2010-06-20 NOTE — Procedures (Signed)
. [facility]  Patient:    Joan Banks, Joan Banks             MRN: [id number] Adm. Date:  [id number] Attending:  Jeannette Corpus                           Procedure Report  HISTORY OF PRESENT ILLNESS:  This is a 65 year old African-American female who underwent TAH BSO for large uterine fibroids on 10/16.  The lower pole of her wound developed a seroma and separated and has been under wound care.  She returns today for wound check.  She denies any fever or chills and says that her home health nurse feels that the wound is closing nicely.  PHYSICAL EXAMINATION:  ABDOMEN:  Soft and nontender.  The upper portion of the midline incision is healing well.  PROCEDURE:  All staples and subcutaneous retention sutures are removed.  The lower portion of the wound is opened, and dressing is removed.  It is granulating well from top to bottom and indeed is closing in.  The wound is redressed with calcium alginate and dry packing.  PLAN:  We will have the patient return to see [place] in two weeks for continuing followup.  She may shower over this wound as it stands now. DD:  12/02/99 TD:  12/02/99 Job: [id number]

## 2010-06-20 NOTE — Procedures (Signed)
NAME:  Joan Banks, Joan Banks           ACCOUNT NO.:  [id number]   MEDICAL RECORD NO.:  [id number]          PATIENT TYPE:  OUT   LOCATION:  SLEEP CENTER                 FACILITY:  [facility]   PHYSICIAN:  Clinton D. Maple Hudson, M.D. DATE OF BIRTH:  [birth year]   DATE OF STUDY:  12/12/2003                              NOCTURNAL POLYSOMNOGRAM   REFERRING PHYSICIAN:  Lina Sayre, MD   INDICATION FOR STUDY:  Hypersomnia with sleep apnea.  Epworth Sleepiness  score 10/24, BMI 44.9, weight 270 pounds.   SLEEP ARCHITECTURE:  Total sleep time 418 minutes with sleep efficiency 85%.  Stage I was 14%, stage II 76%, stages III and IV 9%, REM was 2% of total  sleep time.  Sleep latency 7.5 minutes.  REM latency 175 minutes.  Awake  after sleep onset 65 minutes.  Arousal index elevated at 73.9.   RESPIRATORY DATA:  Split study protocol.  RDI 136 per hour indicating very  severe obstructive sleep apnea/hypopnea syndrome before CPAP.  This included  195 obstructive apneas and 145 hypopneas before CPAP.  Events were not  positional.  REM RDI 83 per hour.  CPAP was titrated to 17 CWP, RDI 1.4 per  hour using a medium Respironics soft gel mask with heated humidifier.   OXYGEN DATA:  Very loud snoring.  Severe and repeated oxygen desaturation  before CPAP to a nadir of 50%.  The technician added oxygen at 2 liters per  minute while also titrating CPAP.  Oxygenation after CPAP control and on 2  liters of oxygen was at 95% to 98% saturation.   CARDIAC DATA:  Normal sinus rhythm.   MOVEMENTS/PARASOMNIA:  Occasional leg jerk, insignificant.   IMPRESSION/RECOMMENDATION:  Very severe obstructive sleep apnea/hypopnea  syndrome, RDI 136 per hour.  Severe oxygen desaturation to 50% on room air  before CPAP.  CPAP titration to 17 CWP, RDI 1.4 per hour using a medium  Respironics soft gel mask with heated humidifier.  Technician added oxygen  at 2 liters per minute during CPAP titration.  Unless the patient is known  to be  hypoxic, and oxygen dependent on room air while awake, suggest  beginning home CPAP therapy and then scheduling an overnight oximetry  evaluation on room air before deciding whether addition of home oxygen  through CPAP will be required.                                                           Clinton D. Maple Hudson, M.D.  Diplomate, [employer] Board   CDY/MEDQ  D:  12/23/2003 12:07:06  T:  12/23/2003 16:23:25  Job:  [id number]

## 2010-06-20 NOTE — Consult Note (Signed)
[facility]  Patient:    Joan Banks, Joan Banks             MRN: [id number] Proc. Date: 11/26/99 Adm. Date:  [id number] Disc. Date: [id number] Attending:  Sabino Donovan CC:         Bing Neighbors. Clearance Coots, M.D.  Lowell C. Catha Gosselin, M.D.  Madaline Savage. Laural Golden, M.D.  Farley Ly, M.D., [facility]  Telford Nab, R.N.   Consultation Report  GYNECOLOGIC ONCOLOGY CLINIC  [age]-year-old white female who underwent a TAH/BSO and resection of perianal condylomata on November 18, 1999.  The lower pole of her wound opened secondary to apparent seroma and has been undergoing wound care using calcium alginate in the base and dry packing.  Patient denies any fever or chills or any GI or GU symptoms.  She has minimal pain.  PHYSICAL EXAMINATION  ABDOMEN:  The wound is inspected.  It is granulating well all the way down to the base.  About two inches of a PDS suture are removed.  Every other staple was removed and Steri-Strips were applied.  The two retention sutures in the upper abdomen are left in place.  IMPRESSION:  Wound separation, which is recovering well using conservative care.  She will continue on this regimen for the next week and we will plan on seeing her back at that time to remove her retention sutures and the remainder of her staples.  If we can get excellent granulation tissue throughout the wound, we may well consider the possibility of secondary wound closure. DD:  11/26/99 TD:  11/26/99 Job: [id number]

## 2010-06-20 NOTE — Consult Note (Signed)
[facility]  Patient:    Joan Banks, Joan Banks             MRN: [id number] Proc. Date: 11/19/99 Adm. Date:  [id number] Attending:  Ronita Hipps T CC:         Farley Ly, M.D.   Consultation Report  DATE OF BIRTH:  16-Nov-1945.  ADMITTING PHYSICIAN:  Dr. Coral Ceo.  REASON FOR CONSULTATION: 1. General mental care in the inpatient setting. 2. Hypertension. 3. Uncontrolled type 2 diabetes mellitus.  HISTORY OF PRESENT ILLNESS:  Joan Banks is a 65 year old female who was admitted to the hospital on November 18, 1999, to undergo a total abdominal hysterectomy and right salpingo-oophorectomy with debulking of a fibroid of the vesicouterine space.  This fibroid was 30 cm in diameter and is considerable size and was causing severe abdominal pain and cramping.  The patient tolerated the procedure well and is in postoperative recovery phase at this time.  She is seen in her hospital room where her only complain is moderate abdominal pain about the surgical site.  Otherwise she is recovering well and has no specific requests, questions or complaints at this time.  REVIEW OF SYSTEMS:  The patient denies fever, chills, diaphoresis, shortness of breath, wheezing, cough, chest pain, palpitations, angina, dyspnea on exertion, vomiting, nausea, hematemesis, hemoptysis, lower extremity edema, focal paralysis or paresthesias, focal loss of strength in upper or lower extremities, abrupt change in mood or attitude, confusion, dizziness or abrupt change in bladder habits.  The patient endorses chronic nasal congestion consistent with allergic rhinitis and moderate abdominal pain, limited to region of recent surgery and relatively well controlled on morphine PCA.  PAST MEDICAL HISTORY: 1. Hypertension. 2. Type 2 diabetes.  Oral medications were initiated approximately seven days    ago as this is a new diagnosis. 3. Questionable episode of  congestive heart failure x 1, now well compensated. 4. History of chronic anemia of unknown etiology, possibly secondary to    malnutrition per patient. 5. History of chronic gastritis. 6. History of intraductal breast carcinoma, status post lumpectomy in 1999, on tamoxifen. 7. History of tobacco abuse, in the amount of 1/2 pack per day x 30+ years. 8. Questionable atrial septal defect with apparent complete work-up at [facility] per the patient with record transfer pending. 9. 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  MEDICATIONS:  Atenolol 25 mg q.d., Norvasc 10 mg q.d., Lasix 40 mg q.d., tamoxifen 10 mg b.i.d. K-Dur 20 mEq q.d., Nasonex 2 puffs each nostril q.d., Glucophage 500 mg 1 tablet b.i.d.  ALLERGIES:  No known drug allergies.  FAMILY HISTORY:  The patients mother died at age 35 of cancer of unknown source.  Father died at age 42, was positive for diabetes, but his exact cause of death is unknown.  SOCIAL HISTORY:  The patient is disabled.  She lives at home.  She helps care for her disabled husband.  She denies alcohol use recently, but does have a history of alcohol use, which she states was "heavy" at times.  She states she has not had alcohol at all in about six months.  Tobacco abuse is as per medical history.  She denies use of other illicit drugs.  She has no children.  PHYSICAL EXAMINATION:  GENERAL:  An obese African-American female lying in bed in no acute distress.  VITAL SIGNS:  Temperature 97.3, blood pressure 114/63, heart rate  93, respiratory rate 18.  CBG 204-226.  HEENT:  Normocephalic, atraumatic.  Left pupil round and reactive to light. Right pupil nonreactive with severe scarring of the sclera.  Extraocular movements intact in left eye.  Nasal mucosa pink and moist with scant clear discharge.  OC/OP clear. Multiple missing teeth with moderate dental caries evident.  Hearing grossly intact  bilaterally.  NECK:  No thyromegaly.  LUNGS:  Clear to auscultation bilaterally throughout anterior and posterior fields without appreciable wheeze.  CARDIOVASCULAR:  Regular rate and rhythm with 2/6 early systolic murmur, without gallop or rub.  ABDOMEN:  Obese, appropriately tender about surgical scar which is a large midline scar with staples intact and no erythema or drainage at this time.  No hepatosplenomegaly.  Bowel sounds hypoactive.  Nondistended, soft.  EXTREMITIES:  No edema or cyanosis appreciable with 2+ dorsalis pedis pulses bilateral lower extremities and 2+ radial pulses bilaterally.  NEUROLOGIC:  5/5 strength throughout bilateral upper and lower extremities. Intact sensation to touch throughout. No Babinski.  Alert and oriented x 4.  LABORATORY DATA:  Sodium 138, potassium 3.9, chloride 105, CO2 30, BUN 6, creatinine 0.7, calcium 7.2, glucose 242; hemoglobin 8.1, platelets 171, white count 10.9, MCV 75m absolute neutrophil count 8.9.  IMPRESSION AND PLAN: 1. Hypertension - The patients blood pressure is being well-controlled t this    time on her current regimen of atenolol and Norvasc.  No changes will be    made at this time and we will continue to follow in the perioperative    setting. 2. Uncontrolled type 2 diabetes - The patients CBGs are elevated at this    time.  She is not tolerating p.o. at a reliable rate at this point.  While    she is requiring D5 normal saline as an IV fluid, we will place her on    sliding scale insulin and follow CBGs on a q.6h. basis.  We will dose her    insulin on a b.i.d. dosing; however, to assure there is no overlap in the    insulin action.  As soon as the patient is able to tolerate reliable p.o.    intake, we will place her on an oral medication and follow CBGs closely. 3. Anemia - The patient apparently has a history of a chronic anemia,    although the etiology is not clear at this time.  Clearly, her acute     exacerbation has been secondary to a major and complicated surgery.  We     will follow her CBC closely and transfuses for levels less than 8 that are    not well-tolerated. 4. History of breast cancer - I agree with holding the patients tamoxifen at    this time until she is ambulatory, in order to avoid unnecessary increase    of risk of deep venous thromboembolism. 5. Tobacco abuse - The patient will be counseled during this hospitalization    multiple times, as to the appropriateness of discontinuing tobacco use and    the deleterious effects of this substance on ones health. 6. Questionable atrial septal defect.  The patient reports this was evaluated    completely and she was cleared by a cardiologist prior to undergoing    surgery.  I will simply obtain records from the patients primary care    doctor to be sure all of the concerns postoperatively have been addressed    and there is nothing new to be considered at this time.  FURTHER  HISTORY:  I have requested medical records from the [facility] outpatient clinic where the patient is followed by Dr Margarito Liner to assure that all relevant medical issues are being addressed while she is hospitalized under my care at [facility]. DD:  11/19/99 TD:  11/19/99 Job: [id number]

## 2010-06-20 NOTE — Discharge Summary (Signed)
[name], [name]           ACCOUNT NO.:  [id number]   MEDICAL RECORD NO.:  [id number]          PATIENT TYPE:  INP   LOCATION:  5504                         FACILITY:  [facility]   PHYSICIAN:  Dennis Bast, MD        DATE OF BIRTH:  1945/09/19   DATE OF ADMISSION:  11/27/2003  DATE OF DISCHARGE:  12/01/2003                                 DISCHARGE SUMMARY   DISCHARGE DIAGNOSES:  1.  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  2.  Non-insulin-dependent diabetes mellitus type 2.  3.  Hypertension.  4.  Acute renal failure.  5.  Obesity.  6.  Iron-deficiency anemia.   DISCHARGE MEDICATIONS:  1.  Glucotrol XL 5 mg daily.  2.  Glucophage 1000 mg q.a.m. and 500 mg mid day, and 1000 mg q.h.s.  3.  Furosemide 40 mg daily.  4.  Atenolol 20 mg daily.  5.  Combivent MDI, two puffs four times a day, q.6h.  6.  Nasonex two puffs per nostril once a day.  7.  Zyrtec 10 mg, one tablet a day.  8.  Protonix 40 mg, one tablet a day.  9.  Xalatan 0.005%, one drop on the left eye once a day.  10. Avelox 400 mg a day for seven days.  11. Prednisone 10 mg, two tablets a day for two days, then one tablet a day      for two days, then one-half tablet a day for two days, and then stop.  12. Oxygen two liters per cannula at rest.  Use three liters per minute when      walking or doing activities.   CONSULTATIONS:  None.   PROCEDURES:  Chest x-ray on October 25 and angio CT of the chest on October  25.   DISPOSITION/FOLLOWUP:  The patient is to go home with Home Health care to  take care of the oxygen and CPAP, and she has an appointment at the  outpatient clinic on Wednesday, November 2, at 9:45 with Dr. Meredith Pel.   HISTORY OF PRESENT ILLNESS:  A 65 year old year-old [place] American woman  with a history of recent admission to our service for pulmonary edema on  October 17, who came to the outpatient clinic for  hospital followup.  She  complains of persistent dyspnea at minimal effort, going from the living  room to the bathroom, and orthopnea of two pillows, even though she says  sometimes she is able to lie flat. She also complains of fatigue and  somnolence.  She denied chest pain, PND and edema of the lower extremities.  At the outpatient clinic, the patient was found to be hypoxemic, reason why  her admission was decided.  During her last admission, the patient was found  to have loud snoring and easy to fall asleep.   ALLERGIES:  No known drug allergies.   PAST MEDICAL HISTORY:  1.  Hypertension.  2.  Noninsulin-dependent diabetes mellitus type 2.  3.  Breast cancer status post lumpectomy, radiotherapy in 1999, follow up  every six months.  4.  Congestive heart failure.  5.  Microcytic anemia.  6.  Seasonal allergic rhinitis.  7.  Bilateral shoulder pain.  8.  Uterine fibroids status post total abdominal hysterectomy October of      2001.  9.  History of gastritis and duodenitis with H pylori positive in 1996.  10. Former smoker.   LABORATORY DATA:  On admission, BNP less than 30, troponin I 0.02, D-Dimer  0.24.  CK 121.  CK-MB 1.3.  R index 1.1.  ABG on room air, pH 7.372, pCO2  55.6, pO2 57.5.  Bicarbonate 31.6.  Saturation of oxygen 86.9.  Sodium 138,  potassium 4.3, chloride 96, bicarbonate 33.  BUN 32.  Creatinine 1.9.  Glucose 162.  Bilirubin 0.5.  Alkaline phosphatase 67.  AST 18.  ALT 16.  Protein 7.3.  Albumin 3.5, calcium 8.3.  CBC is white blood cell count 11.8,  hemoglobin 11.5, hematocrit 37.0.  Platelets 306,000.  ANC 8.4. MCV 75.9.   EKG was normal.   Chest x-ray impression:  1.  Improved changes of congestive heart failure, with procedural pulmonary      vascular congestion, mild chronic interstitial lung disease, and      possible minimal interstitial pulmonary edema.  2.  Stable cardiomegaly.   HOSPITAL COURSE:  1.  Chronic hypoxemic and hypercarbic  respiratory failure.  This is an obese      patient, in no distress on admission, but low saturation of oxygen and      hypoxemia and elevated PCO2 on ABG.  The patient was admitted and      treated with oxygen, albuterol, Atrovent and IV steroids.  The IV      steroids then were switched to oral steroids and tapered down.      Pulmonary function tests were obtained that showed moderate obstructive      lung defect, additional also moderate decrease in diffusing capacity.      Also an angio CT of the chest was done to rule out chronic PE and      interstitial disease. The impression was less than optimal visualization      of the pulmonary arteries, but no definite pulmonary emboli are seen.      There do appear to be some bilateral peribronchial lymph nodes which      could be associated with sarcoidosis.  I see no evidence of neoplasm      within the lungs.  There is some atelectasis of the posterior aspect of      the right lower lobe that does not appear to be associated with      pulmonary infiltrate or bronchiectasis.  Mild cardiomegaly but no      pericardial effusion.  That portion of the liver visualized appears      normal, and the adrenal glands appeared normal as far as can be seen.   Due to the fact that the patient continued to desaturate at night, and that  we obtained some ABG's that worsened in the morning, it was decided to treat  the patient empirically with CPAP at night, due to the fact that it was  strongly felt that this patient had obstructive sleep apnea and OHS.  After  having the patient on oxygen during the day and CPAP at 12 cm of water, plus  two liters of oxygen, the patient improved her saturation of oxygen during  the night, and during the day.  She started to feel better.  Before the  patient left, we did a test of saturations during the day.  The patient lying down during the day without oxygen desaturates up to 84%.  Also, we  did a test with the patient  walking on two liters of oxygen.  She  desaturates below 90, so the oxygen was titrated to three liters to keep  oxygen saturation over 92 when walking, and the patient was able to maintain  good saturation at this rate of oxygen.  For that reason, it was decided to  send the patient at home on two liters of oxygen at rest, and three liters  of oxygen when walking or doing activities, and two liters of oxygen at  night with CPAP.  Also, the patient is going to have sleep study at [facility] on November 9.  1.  Diabetes mellitus.  The patient was managed with her home glipizide plus      SSI, plus Lantus due to the fact that she had elevated sugar because of      the use of steroids during her hospitalization.  She was sent home on      her regular home medications consisting of glipizide and Glucophage.  2.  Hypertension.  The patient was managed only with atenolol 25 mg a day,      and Lasix was held due to the fact of the acute renal failure, and the      patient remained normotensive during all of her hospitalization.  3.  Acute renal failure.  When the patient was admitted, the creatinine was      1.9.  It was felt that this was due to the fact that she was using Lasix      80 mg a day, and this was prerenal failure, so Lasix was held, and      gentle hydration was used with which the creatinine improved being at      her discharge, 1.4.  She is going to continue to use Lasix at home, but      only 40 mg a day.  4.  Obesity.  This is a chronic problem that needs to be addressed as an      outpatient due to the fact that this is a big component of her      respiratory problems.  5.  Iron-deficiency anemia.  This is a chronic problem for this patient and      currently she is on iron treatment.   LABORATORY DATA:  Labs at discharge, CBC:  White blood cell count 8.1,  hemoglobin 10.4, hematocrit 32.9, platelets 286,000.  MCV 75.6, sodium 140,  potassium 4.3, chloride 103, CO2 29,  glucose 220.  BUN 38.  Creatinine 1.4.  Calcium 9.4.   ABG on two liters of oxygen, pH 7.368, pCO2 48.9, pO2 71.4, bicarbonate  27.5, iron 28, TIBC 320.  Percentage of saturation 9%.  Red blood cells  folate 351, B12 414.       YC/MEDQ  D:  12/03/2003  T:  12/03/2003  Job:  [id number]   cc:   Joan Banks, M.D.  [address] [place] [address]  Fax: [phone]   Dr. Meredith Pel  Outpatient [facility]

## 2010-06-20 NOTE — Discharge Summary (Signed)
NAMEBLANCH, Joan Banks           ACCOUNT NO.:  [id number]   MEDICAL RECORD NO.:  [id number]          PATIENT TYPE:  INP   LOCATION:  3731                         FACILITY:  [facility]   PHYSICIAN:  Fransisco Hertz, M.D.  DATE OF BIRTH:  06/26/1945   DATE OF ADMISSION:  11/19/2003  DATE OF DISCHARGE:  11/20/2003                                 DISCHARGE SUMMARY   DISCHARGE DIAGNOSES:  1.  Pulmonary edema secondary to congestive heart failure with diastolic      dysfunction.  2.  Hypertension.  3.  Non-insulin-dependent diabetes mellitus type 2.  4.  Question obstructive sleep apnea, question obstructive hypoventilation      syndrome.  5.  Chronic microcytic anemia.   DISCHARGE MEDICATIONS:  1.  Ferrous sulfate 325 mg 1 tablet 3 times a day.  2.  Colace 400 mg 1 tablet once daily as needed for constipation.  3.  Lasix 80 mg 1 p.o. each day.  4.  Glucotrol 5 mg 1 p.o. each day.  5.  K-Dur 20 mEq q.d.  6.  Nasonex 50 mcg 2 sprays every day.  7.  Atenolol 25 mg every day.  8.  Zyrtec 1 pill every day.  9.  Albuterol MDI 4  times per day.  10. Protonix 40 mg a day.  11. Tylenol #3 1 p.o. 2 times per day p.r.n.  12. Vasotec 10 mg once per day.  13. Xalatan 0.005% 1 gtt on the left eye q.d.  14. Glucophage __________ mg q.a.m., 5 mg at mid time, and 1 mg q.p.m.   CONSULTS:  None.   Chest x-ray on October 17th and a 2D echo on October 18th.   HISTORY OF PRESENT ILLNESS:  This is a 65 year old African-American woman  with a significant past medical history of hypertension, dyspnea, with 2D  echo in 2001 that showed ejection fraction between 60-65% and diabetes  mellitus type 2, non-insulin-dependent.  Last hemoglobin A1C of 6.3 on  September 14.  Comes to the emergency room for dyspnea on exertion for two  weeks that has been increasing.  She normally is able to walk two blocks,  and now she gets short of breath walking from the living room to the  bathroom.  She also complains of  orthopnea of one pillow and occasional  paroxysmal nocturnal edema.  She denied chest pain, nausea, vomiting, or  diaphoresis.  She also states that she is using furosemide, only 40 mg a  day.   ALLERGIES:  No known drug allergies.   PAST MEDICAL HISTORY:  1.  Hypertension.  2.  Diabetes mellitus type 2, non-insulin-dependent.  3.  Breast cancer, status post lumpectomy and radiotherapy.  4.  CHF.  5.  Microcytic anemia.  6.  Seasonal allergy, rhinitis.  7.  Bilateral shoulder pain.  8.  Uterine fibroids, status post hysterectomy in 2001.  9.  History of gastritis and duodenitis.  10. H. pylori positive in 1996.  11. A former smoker.   On admission, she had decreased breath sounds on both bases, more on the  right, plus crackles bilaterally.   Chest x-ray was  positive for pulmonary edema on both sides.   BNP 37.4.   EKG:  No acute changes, compared with old EKG.   Sodium 145, potassium 4.2, chloride 106, bicarb 33, BUN 14, creatinine 1.5,  glucose 142.  CBC:  White blood cells 8.0, hemoglobin 10.0, hematocrit 32.3,  platelets 298, MCV 76.4, absolute neutrophil count 6.2.  Bilirubin 0.5,  alkaline phosphatase 82, AST 73, ALT 47, protein 6.2, albumin 3.2, calcium  8.5.   HOSPITAL COURSE:  1.  Pulmonary edema:  Patient was admitted for increased dyspnea.  The      physical exam showed crackles.  Chest x-ray showed findings suggesting      cardiomegaly with pulmonary edema.  Even though the BNP was normal,      patient was treated as pulmonary edema, so she received 60 mg of      furosemide IV at the emergency department and then 40 mg IV on the      floor.  The patient had improvement of her dyspnea, and she was very      symptomatic the next day, so she was sent home on her oral medications.      Also, MI was ruled out with a second EKG that did not show any acute      changes, and also, serial cardiac enzymes were negative.  A 2D echo was      done on October 18th.  The 2D  echo summary:  Overall left ventricular      systolic function was vigorous.  Left ventricular ejection fraction was      estimated, range being 65-75%, although no diagnostic left ventricular      regional wall motion abnormality was identified.  This probability could      not be completely excluded on the basis of this study.  Left ventricular      wall thickness was moderately increased.  There was mild mitral valve      regurgitation.  The left atrium was mildly dilated.  The estimated peak      right ventricular systolic pressure was mildly increased.  2.  Hypertension:  Patient was maintained on her home medication, and she      was normotensive during her admission.  3.  Non-insulin-dependent diabetes mellitus type 2:  Patient was managed      with her Glucotrol plus SSI.  Metformin was held in the event of any      need of procedure with IV dye.  She was told to restart it at her home.  4.  Question OSA/question OHS:  Patient is an obese patient who falls asleep      easily and has loud snoring.  It is suggested to work this up as an      outpatient.  5.  Anemia:  Patient probably has anemia of chronic disease, according to      old records.  Ferritin during this admission was 32.  So the patient was      sent home on ferrous sulfate and further workup for this must be done as      an outpatient.   DISCHARGE LABS:  Hemoglobin 10.1, hematocrit 32.0, white blood cell count  7.3, platelets 287, MCV 76.4.  BMET:  Sodium 145, potassium 4.1, chloride  102, CO2 38, BUN 17, creatinine 1.3, glucose 129.  Lipid profile:  Cholesterol 124, triglycerides 103, HDL 36, VLDL 21, LDL 67.  TSH 1.397.   DISPOSITION:  The patient is to go  home with a followup at the outpatient  clinic at [facility] on November 27, 2003 at 9:00 a.m. with Dr. Meredith Pel.  He  was scheduled a BMET on arrival to the clinic.       YC/MEDQ  D:  11/21/2003  T:  11/21/2003  Job:  [id number]  cc:   Ileana Roup, M.D.   [address] [place] [address]  Fax: [phone]

## 2010-06-29 ENCOUNTER — Other Ambulatory Visit: Payer: Self-pay | Admitting: Internal Medicine

## 2010-07-30 ENCOUNTER — Other Ambulatory Visit: Payer: Self-pay | Admitting: Internal Medicine

## 2010-07-30 DIAGNOSIS — E1149 Type 2 diabetes mellitus with other diabetic neurological complication: Secondary | ICD-10-CM

## 2010-07-30 NOTE — Telephone Encounter (Signed)
Refill approved - nurse to complete. 

## 2010-07-31 NOTE — Telephone Encounter (Signed)
Tylenol #3 called to [facility].

## 2010-08-20 ENCOUNTER — Ambulatory Visit (INDEPENDENT_AMBULATORY_CARE_PROVIDER_SITE_OTHER): Payer: PRIVATE HEALTH INSURANCE | Admitting: Internal Medicine

## 2010-08-20 ENCOUNTER — Encounter: Payer: Self-pay | Admitting: Internal Medicine

## 2010-08-20 VITALS — BP 155/76 | HR 81 | Temp 97.7°F | Ht 63.0 in | Wt 256.4 lb

## 2010-08-20 DIAGNOSIS — E119 Type 2 diabetes mellitus without complications: Secondary | ICD-10-CM

## 2010-08-20 DIAGNOSIS — I10 Essential (primary) hypertension: Secondary | ICD-10-CM

## 2010-08-20 DIAGNOSIS — N189 Chronic kidney disease, unspecified: Secondary | ICD-10-CM

## 2010-08-20 DIAGNOSIS — E785 Hyperlipidemia, unspecified: Secondary | ICD-10-CM

## 2010-08-20 DIAGNOSIS — D509 Iron deficiency anemia, unspecified: Secondary | ICD-10-CM

## 2010-08-20 LAB — GLUCOSE, CAPILLARY: Glucose-Capillary: 276 mg/dL — ABNORMAL HIGH (ref 70–99)

## 2010-08-20 MED ORDER — SITAGLIPTIN PHOSPHATE 25 MG PO TABS: 25.0000 mg | ORAL_TABLET | Freq: Every day | ORAL | Status: DC

## 2010-08-20 NOTE — Assessment & Plan Note (Addendum)
Will check a metabolic panel with upcoming labs.

## 2010-08-20 NOTE — Assessment & Plan Note (Signed)
Lab Results  Component Value Date   HGBA1C 8.4 08/20/2010   HGBA1C 6.7 11/26/2009   CREATININE 1.41* 04/09/2010   CREATININE 1.57* 11/26/2009   MICROALBUR 8.32* 11/26/2009   MICRALBCREAT 133.5* 11/26/2009   CHOL 91 05/01/2009   HDL 35* 05/01/2009   TRIG 83 05/01/2009    Last eye exam and foot exam:    Component Value Date/Time   HMDIABEYEEXA no diabetic retinopathy; legal blindness- DrWynona Canes McCuen 01/06/2010   HMDIABFOOTEX done 11/26/2009    Assessment: Diabetes control: not controlled Progress toward goals: deteriorated Barriers to meeting goals: visual impairment  Plan: Diabetes treatment: Plan is to add Januvia 25 mg daily.  I advised patient to obtain her new blood glucose meter and begin checking her blood sugars prior to starting Januvia. Refer to: diabetes educator for self-management training Instruction/counseling given: reminded to bring blood glucose meter & log to each visit and reminded to bring medications to each visit

## 2010-08-20 NOTE — Patient Instructions (Addendum)
Please obtain your blood glucose meter and begin testing blood sugars daily. Start Januvia 25 mg one tablet daily for diabetes. Please return for fasting blood work later this week.

## 2010-08-20 NOTE — Assessment & Plan Note (Addendum)
Lab results:     Component Value Date/Time   CHOL 91 05/01/2009 1909   TRIG 83 05/01/2009 1909   HDL 35* 05/01/2009 1909   LDLCALC 39 05/01/2009 1909   VLDL 17 05/01/2009 1909   CHOLHDL 2.6 Ratio 05/01/2009 1909    Assessment: The patient is doing well on current regimen.  Plan: Continue simvastatin at current dose

## 2010-08-20 NOTE — Assessment & Plan Note (Signed)
Lab Results  Component Value Date   NA 144 04/09/2010   K 4.4 04/09/2010   CL 104 04/09/2010   CO2 23 04/09/2010   BUN 27* 04/09/2010   CREATININE 1.41* 04/09/2010   CREATININE 1.57* 11/26/2009    BP Readings from Last 3 Encounters:  08/20/10 155/76  06/04/10 145/77  04/09/10 161/78    Assessment: Hypertension control:  mildly elevated  Progress toward goals:  deteriorated Barriers to meeting goals:  visual impairment  Plan: Hypertension treatment:  continue current medications Since I am starting Januvia today, I will not add an antihypertensive medication.  If her blood pressure remains elevated at next visit, then will consider adding another agent to her blood pressure regimen.

## 2010-08-20 NOTE — Progress Notes (Signed)
  Subjective:    Patient ID: Joan Banks, female    DOB: [birth year], 65 y.o.   MRN: [id number]  HPI Patient returns for followup of her diabetes mellitus, hyperlipidemia, hypertension, and other chronic medical problems.  She has no acute complaints today.  She reports that she has been compliant with her medications.  Her blood glucose meter has been broken, so she has not been checking her blood sugars at home.   Review of Systems  Respiratory: Negative for shortness of breath.   Cardiovascular: Positive for leg swelling (In the mornings). Negative for chest pain.  Gastrointestinal: Negative for nausea, vomiting and abdominal pain.  Genitourinary: Negative for dysuria.       Objective:   Physical Exam  Constitutional: No distress.  Cardiovascular: Normal rate and regular rhythm.  Exam reveals no S3 and no S4.   Murmur heard.  Systolic murmur is present with a grade of 2/6  Pulmonary/Chest: Effort normal and breath sounds normal. She has no wheezes. She has no rales.  Abdominal: Soft. Bowel sounds are normal. She exhibits no distension. There is no tenderness. There is no guarding.  Musculoskeletal: She exhibits no edema.          Assessment & Plan:

## 2010-08-22 ENCOUNTER — Other Ambulatory Visit: Payer: PRIVATE HEALTH INSURANCE

## 2010-08-22 DIAGNOSIS — E785 Hyperlipidemia, unspecified: Secondary | ICD-10-CM

## 2010-08-22 DIAGNOSIS — D509 Iron deficiency anemia, unspecified: Secondary | ICD-10-CM

## 2010-08-22 DIAGNOSIS — E119 Type 2 diabetes mellitus without complications: Secondary | ICD-10-CM

## 2010-08-23 LAB — CBC WITH DIFFERENTIAL/PLATELET
Basophils Absolute: 0 10*3/uL (ref 0.0–0.1)
Basophils Relative: 0 % (ref 0–1)
Eosinophils Absolute: 0.2 10*3/uL (ref 0.0–0.7)
Eosinophils Relative: 2 % (ref 0–5)
HCT: 31 % — ABNORMAL LOW (ref 36.0–46.0)
MCH: 21.7 pg — ABNORMAL LOW (ref 26.0–34.0)
MCHC: 29 g/dL — ABNORMAL LOW (ref 30.0–36.0)
MCV: 74.9 fL — ABNORMAL LOW (ref 78.0–100.0)
Monocytes Absolute: 0.6 10*3/uL (ref 0.1–1.0)
Platelets: 251 10*3/uL (ref 150–400)
RDW: 15.9 % — ABNORMAL HIGH (ref 11.5–15.5)
WBC: 8.4 10*3/uL (ref 4.0–10.5)

## 2010-08-23 LAB — COMPLETE METABOLIC PANEL WITH GFR
Alkaline Phosphatase: 54 U/L (ref 39–117)
Creat: 1.69 mg/dL — ABNORMAL HIGH (ref 0.50–1.10)
GFR, Est African American: 37 mL/min — ABNORMAL LOW (ref >60)
GFR, Est Non African American: 30 mL/min — ABNORMAL LOW (ref >60)
Glucose, Bld: 191 mg/dL — ABNORMAL HIGH (ref 70–99)
Sodium: 141 mEq/L (ref 135–145)
Total Bilirubin: 0.3 mg/dL (ref 0.3–1.2)
Total Protein: 7 g/dL (ref 6.0–8.3)

## 2010-08-23 LAB — LIPID PANEL
HDL: 34 mg/dL — ABNORMAL LOW (ref >39)
Total CHOL/HDL Ratio: 2.9 Ratio
Triglycerides: 134 mg/dL (ref <150)

## 2010-08-26 ENCOUNTER — Telehealth: Payer: Self-pay | Admitting: Dietician

## 2010-08-26 ENCOUNTER — Other Ambulatory Visit: Payer: Self-pay | Admitting: Internal Medicine

## 2010-08-26 NOTE — Telephone Encounter (Signed)
Returned patient call: she wants assistance obtaining glucose monitoring supplies. Cannot afford to pay co-pay of 20+ dollars her retail pharmacy will charge her. [name] gave me permission to call her mail order company: [facility] to see if they'd be willing to work with her. She has not been checking her blood sugar, cannot find her meter.   [facility] will try to get her insurance company to approve new Accu-chek meter and has been and will waive her co-pay. Per their request have re-faxed Rx done by physician on 3-21/12 with meter checked and name of brand specified.  Discussed scheduling appointment with CDE-patient will schedule on same day as her next doctor appointment in 2 months.   Called patient and relayed information about her testing supplies per her request.

## 2010-08-27 ENCOUNTER — Other Ambulatory Visit: Payer: Self-pay | Admitting: Internal Medicine

## 2010-08-28 NOTE — Telephone Encounter (Signed)
Received call from [facility] that they cannot bill patient insurance until 09/24/10.  Relayed this information to patient who says she found New meter and supplies - "[employer]" her sugar today was  191 after breakfast, then checked with her old meter ( ? Strips not in date) seems more accurate per Tyler Aas with result of 117, hasn't been eating to get sugar to go down, spoon of grits, meat and 2 small eggs for breakfast. Explained to patient that 191 was more likely her after email blood sugar and encouraged her to continue trying to make dietary changes. She complained of hunger and has only been on Januvia x 2 days, discussed this may help a little bit with hunger in the next week or so.   She is to call [facility] about submitting for accuchek meter and strips in august and call CDE if she needs a meter

## 2010-09-29 ENCOUNTER — Other Ambulatory Visit: Payer: Self-pay | Admitting: Internal Medicine

## 2010-10-10 ENCOUNTER — Telehealth: Payer: Self-pay | Admitting: *Deleted

## 2010-10-10 NOTE — Telephone Encounter (Signed)
That will be fine; can you call the order in?

## 2010-10-10 NOTE — Telephone Encounter (Signed)
Pt called asking for a shower chair with a back, her old one has broken. Pt gets her supplies from [facility], she has home O2. Will you give the order for this?

## 2010-10-10 NOTE — Telephone Encounter (Signed)
Rx faxed in to [facility].  

## 2010-10-14 NOTE — Telephone Encounter (Signed)
Pt needs a new script for shower chair as before but it is too small, the script needs to specify for 250 lbs or greater, could you do this for dr Meredith Pel, [facility] will [facility] to [facility]  Thanks,h.

## 2010-10-14 NOTE — Telephone Encounter (Signed)
Addended by: Ulyess Mort on: 10/14/2010 12:30 PM   Modules accepted: Orders

## 2010-10-22 ENCOUNTER — Ambulatory Visit (INDEPENDENT_AMBULATORY_CARE_PROVIDER_SITE_OTHER): Payer: PRIVATE HEALTH INSURANCE | Admitting: Internal Medicine

## 2010-10-22 ENCOUNTER — Encounter: Payer: Self-pay | Admitting: Internal Medicine

## 2010-10-22 VITALS — BP 134/78 | HR 79 | Temp 97.0°F | Ht 63.0 in | Wt 250.4 lb

## 2010-10-22 DIAGNOSIS — E785 Hyperlipidemia, unspecified: Secondary | ICD-10-CM

## 2010-10-22 DIAGNOSIS — N189 Chronic kidney disease, unspecified: Secondary | ICD-10-CM

## 2010-10-22 DIAGNOSIS — E119 Type 2 diabetes mellitus without complications: Secondary | ICD-10-CM

## 2010-10-22 DIAGNOSIS — Z23 Encounter for immunization: Secondary | ICD-10-CM

## 2010-10-22 DIAGNOSIS — I10 Essential (primary) hypertension: Secondary | ICD-10-CM

## 2010-10-22 LAB — BASIC METABOLIC PANEL WITH GFR
CO2: 20 mEq/L (ref 19–32)
Chloride: 110 mEq/L (ref 96–112)
Potassium: 4.3 mEq/L (ref 3.5–5.3)
Sodium: 147 mEq/L — ABNORMAL HIGH (ref 135–145)

## 2010-10-22 LAB — GLUCOSE, CAPILLARY: Glucose-Capillary: 110 mg/dL — ABNORMAL HIGH (ref 70–99)

## 2010-10-22 MED ORDER — SITAGLIPTIN PHOSPHATE 50 MG PO TABS: 50.0000 mg | ORAL_TABLET | Freq: Every day | ORAL | Status: DC

## 2010-10-22 NOTE — Assessment & Plan Note (Addendum)
Lipids:    Component Value Date/Time   CHOL 97 08/22/2010 0932   TRIG 134 08/22/2010 0932   HDL 34* 08/22/2010 0932   LDLCALC 36 08/22/2010 0932   VLDL 27 08/22/2010 0932   CHOLHDL 2.9 08/22/2010 0932    Assessment: LDL is at goal on current dose of simvastatin, and patient is taking the medication without any apparent problems.  Plan: Continue simvastatin at current dose

## 2010-10-22 NOTE — Assessment & Plan Note (Signed)
Lab Results  Component Value Date   HGBA1C 7.1 10/22/2010   HGBA1C 8.4 08/20/2010   HGBA1C 8.0 04/09/2010   Lab Results  Component Value Date   MICROALBUR 8.32* 11/26/2009   LDLCALC 36 08/22/2010   CREATININE 1.97* 10/22/2010    Last eye exam and foot exam:    Component Value Date/Time   HMDIABEYEEXA no diabetic retinopathy; legal blindness- DrWynona Canes McCuen 01/06/2010   HMDIABFOOTEX done 11/26/2009    Assessment: Diabetes control: A1c is slightly above target range Progress toward goals: improved Barriers to meeting goals: no barriers identified  Plan: Diabetes treatment: Increase Januvia to 50 mg daily; continue other medications as before. Refer to: none Instruction/counseling given: discussed foot care

## 2010-10-22 NOTE — Progress Notes (Signed)
  Subjective:    Patient ID: Joan Banks, female    DOB: 12-27-1945, 65 y.o.   MRN: [id number]  HPI Patient presents for followup of her diabetes, renal insufficiency, hypertension, and other chronic medical problems.  She has no acute complaints today, and reports that she has been doing well.  Her blood sugars have been reasonably well controlled.  She reports that she is compliant with her medications.   Review of Systems  Respiratory: Negative for shortness of breath.   Cardiovascular: Negative for chest pain and leg swelling.  Gastrointestinal: Negative for nausea, vomiting and abdominal pain.       Objective:   Physical Exam  Constitutional: No distress.  Cardiovascular: Normal rate and regular rhythm.  Exam reveals no S3 and no S4.   Murmur heard.  Systolic murmur is present with a grade of 2/6       No lower extremity edema  Pulmonary/Chest: Effort normal and breath sounds normal. No respiratory distress. She has no wheezes. She has no rales.          Assessment & Plan:

## 2010-10-22 NOTE — Patient Instructions (Signed)
Increase Januvia to a dose of 50 mg once daily.

## 2010-10-22 NOTE — Assessment & Plan Note (Signed)
  BP Readings from Last 3 Encounters:  10/22/10 134/78  08/20/10 155/76  06/04/10 145/77    Assessment: Hypertension control:  mildly elevated  Progress toward goals:  improved Barriers to meeting goals:  no barriers identified  Plan: Hypertension treatment:  continue current medications

## 2010-10-22 NOTE — Assessment & Plan Note (Signed)
  Lab Results  Component Value Date   CREATININE 1.69* 08/22/2010   CREATININE 1.41* 04/09/2010   CREATININE 1.57* 11/26/2009   CREATININE 1.50* 05/01/2009   CREATININE 1.50* 11/14/2008    Patient's creatinine has fluctuated over the past couple of years, with the last value of 1.69 being a little higher than her baseline.  I discussed this with her today.  The plan is to check a metabolic panel today.

## 2010-10-23 ENCOUNTER — Telehealth: Payer: Self-pay | Admitting: Internal Medicine

## 2010-10-23 DIAGNOSIS — N189 Chronic kidney disease, unspecified: Secondary | ICD-10-CM

## 2010-10-23 NOTE — Assessment & Plan Note (Signed)
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  Plan is to check U/A and microalbumin, and refer to nephrology.  I called patient today and discussed the plans with her.

## 2010-10-23 NOTE — Telephone Encounter (Signed)
Patient's creatinine on 9/19 had increased to 1.97.  I called her and discussed this with her; plan is to have her come in for urinalysis and microalbumin, and refer her to nephrology.

## 2010-10-23 NOTE — Telephone Encounter (Signed)
Rx faxed

## 2010-10-27 ENCOUNTER — Other Ambulatory Visit: Payer: Self-pay | Admitting: Internal Medicine

## 2010-10-27 ENCOUNTER — Other Ambulatory Visit: Payer: PRIVATE HEALTH INSURANCE

## 2010-10-27 DIAGNOSIS — N189 Chronic kidney disease, unspecified: Secondary | ICD-10-CM

## 2010-10-27 LAB — URINALYSIS, ROUTINE W REFLEX MICROSCOPIC
Bilirubin Urine: NEGATIVE
Ketones, ur: NEGATIVE mg/dL
Protein, ur: NEGATIVE mg/dL
Urobilinogen, UA: 0.2 mg/dL (ref 0.0–1.0)

## 2010-10-27 NOTE — Telephone Encounter (Signed)
Addended by: Remus Blake on: 10/27/2010 09:16 AM   Modules accepted: Orders

## 2010-10-28 LAB — URINALYSIS, MICROSCOPIC ONLY
Casts: NONE SEEN
Crystals: NONE SEEN

## 2010-10-28 LAB — MICROALBUMIN / CREATININE URINE RATIO: Microalb, Ur: 8.23 mg/dL — ABNORMAL HIGH (ref 0.00–1.89)

## 2010-10-30 ENCOUNTER — Other Ambulatory Visit: Payer: Self-pay | Admitting: Internal Medicine

## 2010-10-30 DIAGNOSIS — R8290 Unspecified abnormal findings in urine: Secondary | ICD-10-CM

## 2010-10-30 DIAGNOSIS — N390 Urinary tract infection, site not specified: Secondary | ICD-10-CM | POA: Insufficient documentation

## 2010-11-03 ENCOUNTER — Other Ambulatory Visit: Payer: PRIVATE HEALTH INSURANCE

## 2010-11-03 DIAGNOSIS — R8290 Unspecified abnormal findings in urine: Secondary | ICD-10-CM

## 2010-11-04 LAB — URINALYSIS, MICROSCOPIC ONLY

## 2010-11-04 LAB — GLUCOSE, CAPILLARY: Glucose-Capillary: 173 — ABNORMAL HIGH

## 2010-11-04 LAB — URINALYSIS, ROUTINE W REFLEX MICROSCOPIC
Hgb urine dipstick: NEGATIVE
Ketones, ur: NEGATIVE mg/dL
Nitrite: NEGATIVE
Urobilinogen, UA: 0.2 mg/dL (ref 0.0–1.0)

## 2010-11-05 ENCOUNTER — Other Ambulatory Visit: Payer: Self-pay | Admitting: Internal Medicine

## 2010-11-05 ENCOUNTER — Telehealth: Payer: Self-pay | Admitting: Internal Medicine

## 2010-11-05 DIAGNOSIS — N390 Urinary tract infection, site not specified: Secondary | ICD-10-CM

## 2010-11-05 MED ORDER — CEPHALEXIN 500 MG PO CAPS: 500.0000 mg | ORAL_CAPSULE | Freq: Two times a day (BID) | ORAL | Status: AC

## 2010-11-05 NOTE — Assessment & Plan Note (Signed)
Repeat urinalysis was consistent with a urinary tract infection, and the urine culture grew Escherichia coli in significant numbers.  I called patient today and spoke with her.  She reports having had some subjective fever and dysuria several days ago but none at present.  The plan is to treat with Keflex 500 mg by mouth twice a day for 7 days based on the sensitivities of the Escherichia coli.  I advised patient to let me know she has any symptoms or other problems.  

## 2010-11-05 NOTE — Telephone Encounter (Signed)
Repeat urinalysis was consistent with a urinary tract infection, and the urine culture grew Escherichia coli in significant numbers.  I called patient today and spoke with her.  She reports having had some subjective fever and dysuria several days ago but none at present.  The plan is to treat with Keflex 500 mg by mouth twice a day for 7 days based on the sensitivities of the Escherichia coli.  I advised patient to let me know she has any symptoms or other problems.

## 2010-11-24 ENCOUNTER — Other Ambulatory Visit: Payer: Self-pay | Admitting: Internal Medicine

## 2010-11-28 ENCOUNTER — Other Ambulatory Visit: Payer: Self-pay | Admitting: Internal Medicine

## 2010-12-08 ENCOUNTER — Telehealth: Payer: Self-pay | Admitting: *Deleted

## 2010-12-08 NOTE — Telephone Encounter (Signed)
Pt calls and requests a statement to transportation that she needs someone to ride with her to appts due to her legal blindness, states that ph# is [phone] and someone there will give the fax #, donna, could you please assist w/ this problem?  Thanks, h.

## 2010-12-09 ENCOUNTER — Encounter: Payer: Self-pay | Admitting: Licensed Clinical Social Worker

## 2010-12-30 ENCOUNTER — Other Ambulatory Visit: Payer: Self-pay | Admitting: Internal Medicine

## 2011-01-01 NOTE — Telephone Encounter (Signed)
Refused metformin refill due to creatinine.

## 2011-01-09 ENCOUNTER — Ambulatory Visit (INDEPENDENT_AMBULATORY_CARE_PROVIDER_SITE_OTHER): Payer: PRIVATE HEALTH INSURANCE | Admitting: Internal Medicine

## 2011-01-09 ENCOUNTER — Other Ambulatory Visit: Payer: Self-pay | Admitting: Internal Medicine

## 2011-01-09 ENCOUNTER — Encounter: Payer: Self-pay | Admitting: Internal Medicine

## 2011-01-09 DIAGNOSIS — N189 Chronic kidney disease, unspecified: Secondary | ICD-10-CM

## 2011-01-09 DIAGNOSIS — E119 Type 2 diabetes mellitus without complications: Secondary | ICD-10-CM

## 2011-01-09 DIAGNOSIS — N390 Urinary tract infection, site not specified: Secondary | ICD-10-CM

## 2011-01-09 DIAGNOSIS — I10 Essential (primary) hypertension: Secondary | ICD-10-CM

## 2011-01-09 LAB — GLUCOSE, CAPILLARY: Glucose-Capillary: 176 mg/dL — ABNORMAL HIGH (ref 70–99)

## 2011-01-09 NOTE — Assessment & Plan Note (Signed)
Lab Results  Component Value Date   NA 147* 10/22/2010   K 4.3 10/22/2010   CL 110 10/22/2010   CO2 20 10/22/2010   BUN 33* 10/22/2010   CREATININE 1.97* 10/22/2010   CREATININE 1.57* 11/26/2009    BP Readings from Last 3 Encounters:  01/09/11 147/83  10/22/10 134/78  08/20/10 155/76    Assessment: Hypertension control:  mildly elevated  Progress toward goals:  deteriorated Barriers to meeting goals:  no barriers identified  Plan: Hypertension treatment:  continue current medications

## 2011-01-09 NOTE — Assessment & Plan Note (Signed)
Creatinine getting worse lately- 1.97 in September 2012. Recheck today- BMP. She was referred to nephrology and she has an appointment with Dr. Zetta Bills on 02/09/2011.

## 2011-01-09 NOTE — Progress Notes (Signed)
  Subjective:    Patient ID: Joan Banks, female    DOB: Jun 19, 1945, 65 y.o.   MRN: [id number]  HPI Ms. Repetto is a pleasant 65 year old woman with past with history of DM 2, hypertension hyperlipidemia and chronic renal insufficiency who comes to the clinic for medication management and lab work. She was called in to discuss her diabetic medications as she was instructed to stop metformin as her creatinine bumped up to 1.97 recently in September 2012. She's not taking metformin anymore. Her last hemoglobin A1c was 7.1 in September 2012 and I will repeat it today. I will as a repeat BMP to check her creatinine and she also has an appointment with nephrology- Dr. Zetta Bills on 02/09/2011 for initial referral for worsening renal function.  She denies any fever, chills, nausea, vomiting, abdominal pain, chest pain, shortness of breath, urinary abnormalities.    Review of Systems    as per history of present illness, all other systems reviewed and negative. Objective:   Physical Exam Vitals: Reviewed. General:  NAD HEENT: PERRL, EOMI, no scleral icterus Cardiac: S1, S2, RRR, systolic murmur. Pulm: clear to auscultation bilaterally, moving normal volumes of air Abd: soft, nontender, nondistended, BS present Ext: warm and well perfused, no pedal edema Neuro: alert and oriented X3, cranial nerves II-XII grossly intact       Assessment & Plan:

## 2011-01-09 NOTE — Assessment & Plan Note (Signed)
Treated with Keflex in October 2012 by Dr. Meredith Pel. Asymptomatic today.

## 2011-01-09 NOTE — Assessment & Plan Note (Addendum)
Lab Results  Component Value Date   HGBA1C 7.1 10/22/2010   HGBA1C 6.7 11/26/2009   CREATININE 1.97* 10/22/2010   CREATININE 1.57* 11/26/2009   MICROALBUR 8.23* 10/27/2010   MICRALBCREAT 123.2* 10/27/2010   CHOL 97 08/22/2010   HDL 34* 08/22/2010   TRIG 134 08/22/2010    Last eye exam and foot exam:    Component Value Date/Time   HMDIABEYEEXA no diabetic retinopathy; legal blindness- DrWynona Canes McCuen 01/06/2010   HMDIABFOOTEX done 11/26/2009    Assessment: Diabetes control: controlled Progress toward goals: unchanged Barriers to meeting goals: no barriers identified  Plan: Diabetes treatment: continue current medications until she sees Dr. Meredith Pel on 01/21/2011, or if her CBGs at home Runs more than 200 she will call and we will increase the dose of Januvia to 100 mg daily. This was decided after discussing with Dr. Meredith Pel who also saw the patient briefly. Refer to: none Instruction/counseling given: reminded to bring blood glucose meter & log to each visit

## 2011-01-09 NOTE — Patient Instructions (Signed)
Please come to appointment with Dr. Meredith Pel on 01/21/2011. Please take all your medications regularly.

## 2011-01-10 ENCOUNTER — Other Ambulatory Visit: Payer: Self-pay | Admitting: Internal Medicine

## 2011-01-10 DIAGNOSIS — E119 Type 2 diabetes mellitus without complications: Secondary | ICD-10-CM

## 2011-01-10 DIAGNOSIS — H5310 Unspecified subjective visual disturbances: Secondary | ICD-10-CM

## 2011-01-10 LAB — BASIC METABOLIC PANEL WITH GFR
Chloride: 112 mEq/L (ref 96–112)
Creat: 1.7 mg/dL — ABNORMAL HIGH (ref 0.50–1.10)
GFR, Est Non African American: 31 mL/min — ABNORMAL LOW
Potassium: 4.3 mEq/L (ref 3.5–5.3)

## 2011-01-13 ENCOUNTER — Telehealth: Payer: Self-pay | Admitting: Licensed Clinical Social Worker

## 2011-01-13 ENCOUNTER — Telehealth: Payer: Self-pay | Admitting: *Deleted

## 2011-01-13 ENCOUNTER — Encounter: Payer: Self-pay | Admitting: Licensed Clinical Social Worker

## 2011-01-13 NOTE — Telephone Encounter (Signed)
Joan Banks said that the [employer] cut all of her hours of care.  We discussed reasons why she is needing an aide and she has multiple medical issues including vision impairment, COPD, peripheral neuropathy.  She is on 17 medications and needing help w med mgmt, showering, grooming, house chores, errands, meal prep.  Joan Banks has opportunity to appeal by telephone assessment. She will call there today to appeal decision.  Social Work will also follow w/ [employer] to advocate for her.

## 2011-01-13 NOTE — Telephone Encounter (Signed)
Pt called to report cbg is 240 today and last week  300. These are the only readings she has.  She does not check cbg every day. Very seldom is it under 200. She is questioning if she needs to increase her Januvia 50 mg once a day. She is also on glipizide 5 mg daily. Please advise.  Pt # [id number]

## 2011-01-13 NOTE — Telephone Encounter (Signed)
I called patient and advised her to check her blood sugar twice a day until Friday and to then call her numbers in to the clinic.  She is feeling fine without any acute problems.

## 2011-01-13 NOTE — Telephone Encounter (Signed)
Letter of support requesting reinstatement of inhome aide hours faxed to [employer] this AM.

## 2011-01-21 ENCOUNTER — Encounter: Payer: Self-pay | Admitting: Internal Medicine

## 2011-01-21 ENCOUNTER — Ambulatory Visit (INDEPENDENT_AMBULATORY_CARE_PROVIDER_SITE_OTHER): Payer: PRIVATE HEALTH INSURANCE | Admitting: Internal Medicine

## 2011-01-21 VITALS — BP 115/67 | HR 71 | Temp 97.2°F | Ht 63.0 in | Wt 253.3 lb

## 2011-01-21 DIAGNOSIS — N189 Chronic kidney disease, unspecified: Secondary | ICD-10-CM

## 2011-01-21 DIAGNOSIS — E785 Hyperlipidemia, unspecified: Secondary | ICD-10-CM

## 2011-01-21 DIAGNOSIS — E119 Type 2 diabetes mellitus without complications: Secondary | ICD-10-CM

## 2011-01-21 DIAGNOSIS — I10 Essential (primary) hypertension: Secondary | ICD-10-CM

## 2011-01-21 LAB — COMPLETE METABOLIC PANEL WITH GFR
Albumin: 4.1 g/dL (ref 3.5–5.2)
Alkaline Phosphatase: 55 U/L (ref 39–117)
BUN: 33 mg/dL — ABNORMAL HIGH (ref 6–23)
Calcium: 9.8 mg/dL (ref 8.4–10.5)
Creat: 1.95 mg/dL — ABNORMAL HIGH (ref 0.50–1.10)
GFR, Est Non African American: 26 mL/min — ABNORMAL LOW
Glucose, Bld: 233 mg/dL — ABNORMAL HIGH (ref 70–99)
Potassium: 4.3 mEq/L (ref 3.5–5.3)

## 2011-01-21 MED ORDER — ATENOLOL 25 MG PO TABS: 50.0000 mg | ORAL_TABLET | Freq: Two times a day (BID) | ORAL | Status: DC

## 2011-01-21 MED ORDER — SIMVASTATIN 20 MG PO TABS: 20.0000 mg | ORAL_TABLET | Freq: Every day | ORAL | Status: DC

## 2011-01-21 MED ORDER — GLIPIZIDE ER 5 MG PO TB24: 15.0000 mg | ORAL_TABLET | Freq: Every day | ORAL | Status: DC

## 2011-01-21 MED ORDER — IPRATROPIUM-ALBUTEROL 0.5-2.5 (3) MG/3ML IN SOLN: 3.0000 mL | Freq: Four times a day (QID) | RESPIRATORY_TRACT | Status: DC | PRN

## 2011-01-21 MED ORDER — ASPIRIN 81 MG PO TBEC: 81.0000 mg | DELAYED_RELEASE_TABLET | Freq: Every day | ORAL | Status: AC

## 2011-01-21 MED ORDER — ENALAPRIL MALEATE 10 MG PO TABS: 20.0000 mg | ORAL_TABLET | Freq: Two times a day (BID) | ORAL | Status: DC

## 2011-01-21 MED ORDER — FUROSEMIDE 40 MG PO TABS: 40.0000 mg | ORAL_TABLET | Freq: Every day | ORAL | Status: DC

## 2011-01-21 MED ORDER — CLONIDINE HCL 0.1 MG PO TABS: 0.3000 mg | ORAL_TABLET | Freq: Two times a day (BID) | ORAL | Status: DC

## 2011-01-21 MED ORDER — FERROUS SULFATE 325 (65 FE) MG PO TABS: 325.0000 mg | ORAL_TABLET | Freq: Every day | ORAL | Status: DC

## 2011-01-21 MED ORDER — SITAGLIPTIN PHOSPHATE 50 MG PO TABS: 50.0000 mg | ORAL_TABLET | Freq: Every day | ORAL | Status: DC

## 2011-01-21 MED ORDER — MOMETASONE FUROATE 50 MCG/ACT NA SUSP: 2.0000 | Freq: Every day | NASAL | Status: DC

## 2011-01-21 MED ORDER — IPRATROPIUM-ALBUTEROL 18-103 MCG/ACT IN AERO: 2.0000 | INHALATION_SPRAY | Freq: Four times a day (QID) | RESPIRATORY_TRACT | Status: DC

## 2011-01-21 MED ORDER — CETIRIZINE HCL 10 MG PO TABS: 10.0000 mg | ORAL_TABLET | Freq: Every day | ORAL | Status: DC

## 2011-01-21 MED ORDER — OMEPRAZOLE 20 MG PO CPDR: 40.0000 mg | DELAYED_RELEASE_CAPSULE | Freq: Every day | ORAL | Status: DC

## 2011-01-21 MED ORDER — AMLODIPINE BESYLATE 10 MG PO TABS: 10.0000 mg | ORAL_TABLET | Freq: Every day | ORAL | Status: DC

## 2011-01-21 NOTE — Assessment & Plan Note (Signed)
Lipids:    Component Value Date/Time   CHOL 97 08/22/2010 0932   TRIG 134 08/22/2010 0932   HDL 34* 08/22/2010 0932   LDLCALC 36 08/22/2010 0932   VLDL 27 08/22/2010 0932   CHOLHDL 2.9 08/22/2010 0932    Assessment: Patient is doing well on current dose of simvastatin.  Plan: Continue current dose of simvastatin.

## 2011-01-21 NOTE — Assessment & Plan Note (Signed)
Hemoglobin A1C  Date Value Range Status  01/21/2011 7.6   Final  10/22/2010 7.1   Final  08/20/2010 8.4   Final    Assessment: Diabetes control: not controlled Progress toward goals: deteriorated Barriers to meeting goals: visual impairment  Plan: Diabetes treatment: Plan is to increase glipizide XL to a dose of 15 mg daily, and continue other medications at current dose. Refer to: diabetes educator for self-management training Instruction/counseling given: reminded to get eye exam

## 2011-01-21 NOTE — Assessment & Plan Note (Signed)
Lab Results  Component Value Date   NA 145 01/09/2011   K 4.3 01/09/2011   CL 112 01/09/2011   CO2 22 01/09/2011   BUN 30* 01/09/2011   CREATININE 1.70* 01/09/2011    BP Readings from Last 3 Encounters:  01/21/11 115/67  01/09/11 147/83  10/22/10 134/78    Assessment: Hypertension control:  controlled  Progress toward goals:  at goal Barriers to meeting goals:  no barriers identified  Plan: Hypertension treatment:  continue current medications

## 2011-01-21 NOTE — Patient Instructions (Signed)
Increase glipizide XL 5 mg to a dose of 3 tablets daily.

## 2011-01-21 NOTE — Progress Notes (Signed)
  Subjective:    Patient ID: Joan Banks, female    DOB: 1945-09-12, 65 y.o.   MRN: [id number]  HPI Patient returns for followup of her diabetes mellitus, hypertension, hyperlipidemia, renal insufficiency, and other chronic medical problems.  She reports that she has been doing well, without any acute complaints.  She reports that she is compliant with her medications.  Her blood sugars have been somewhat elevated since metformin was discontinued last month in light of her worsening renal function.  She has an appointment with nephrology in January.   Review of Systems  Respiratory: Negative for cough and wheezing.   Cardiovascular: Positive for leg swelling (Mild bilateral pedal edema). Negative for chest pain.  Gastrointestinal: Negative for nausea, vomiting and abdominal pain.  Genitourinary: Negative for dysuria and frequency.       Objective:   Physical Exam  Constitutional: No distress.  Cardiovascular: Normal rate, regular rhythm, S1 normal and S2 normal.  Exam reveals no gallop, no S3, no S4 and no friction rub.   Murmur heard.  Systolic murmur is present with a grade of 2/6       1+ bilateral pedal and ankle edema.  Pulmonary/Chest: No respiratory distress. She has no wheezes. She has no rales.  Abdominal: Soft. Bowel sounds are normal. She exhibits no distension. There is no tenderness. There is no rebound.        Assessment & Plan:

## 2011-01-21 NOTE — Assessment & Plan Note (Signed)
Creat  Date Value Range Status  01/09/2011 1.70* 0.50-1.10 (mg/dL) Final  1/61/0960 4.54* 0.50-1.10 (mg/dL) Final  0/98/1191 4.78* 0.50-1.10 (mg/dL) Final  03/14/5619 3.08* 0.40-1.20 (mg/dL) Final   Assessment: Patient's creatinine has improved a little from 1.97 in September.    Plan: Will check a metabolic panel today.  She has an appointment in January with Dr. Allena Katz at [facility], and I advised her to keep that appointment.

## 2011-01-30 ENCOUNTER — Telehealth: Payer: Self-pay | Admitting: *Deleted

## 2011-01-30 NOTE — Telephone Encounter (Signed)
Rx faxed to [facility]; but Derrian from [facility] was here and stated they were out of Nebulizer. I called the pt to make her awared; informed her she can come and pick up rx and take it to [facility].  I will keep the rx if she decides to pick it up later.

## 2011-01-30 NOTE — Telephone Encounter (Signed)
Prescription written - [facility] to [facility]

## 2011-01-30 NOTE — Telephone Encounter (Signed)
Please call [facility] and find out what specifically I need to order.

## 2011-01-30 NOTE — Telephone Encounter (Signed)
[facility] states "Nebulizer Machine" and include the diagnosis.

## 2011-01-30 NOTE — Telephone Encounter (Signed)
Pt states she filled her rx for nebulizer medication; but her machine is broken and did not know what the medication was for until after it was  Filled.  States she had called [facility]; needs a new rx/order written in order to get a new machine.  Thanks

## 2011-02-04 NOTE — Telephone Encounter (Signed)
Pt had called back late Friday (01/30/11) after 5:00pm, stated she called [facility], they had received the order for the nebulizer and they were able to find one for her.

## 2011-02-13 ENCOUNTER — Other Ambulatory Visit: Payer: Self-pay | Admitting: *Deleted

## 2011-02-13 MED ORDER — IPRATROPIUM-ALBUTEROL 0.5-2.5 (3) MG/3ML IN SOLN: 3.0000 mL | Freq: Four times a day (QID) | RESPIRATORY_TRACT | Status: DC | PRN

## 2011-02-13 NOTE — Telephone Encounter (Signed)
Call from pt would like to have her Nebulizer medication sent to [facility].  Spoke with person at [facility] will need to send in a new prescription.  Fax to [phone].  Angelina Ok, RN 02/13/2011 3:25 PM

## 2011-02-13 NOTE — Telephone Encounter (Signed)
Refill printed and signed - nurse to complete. 

## 2011-02-19 ENCOUNTER — Other Ambulatory Visit (HOSPITAL_COMMUNITY): Payer: Self-pay | Admitting: *Deleted

## 2011-02-23 ENCOUNTER — Encounter (HOSPITAL_COMMUNITY)
Admission: RE | Admit: 2011-02-23 | Discharge: 2011-02-23 | Disposition: A | Discharge status: Home / Self Care | Payer: PRIVATE HEALTH INSURANCE | Source: Ambulatory Visit | Attending: Nephrology | Admitting: Nephrology

## 2011-02-23 DIAGNOSIS — D509 Iron deficiency anemia, unspecified: Secondary | ICD-10-CM | POA: Insufficient documentation

## 2011-02-23 MED ORDER — FERUMOXYTOL INJECTION 510 MG/17 ML
INTRAVENOUS | Status: AC
  Filled 2011-02-23: qty 17

## 2011-02-23 MED ORDER — FERUMOXYTOL INJECTION 510 MG/17 ML
510.0000 mg | INTRAVENOUS | Status: DC
  Administered 2011-02-23: 510 mg via INTRAVENOUS

## 2011-03-02 ENCOUNTER — Encounter (HOSPITAL_COMMUNITY)
Admission: RE | Admit: 2011-03-02 | Discharge: 2011-03-02 | Disposition: A | Discharge status: Home / Self Care | Payer: PRIVATE HEALTH INSURANCE | Source: Ambulatory Visit | Attending: Nephrology | Admitting: Nephrology

## 2011-03-02 MED ORDER — FERUMOXYTOL INJECTION 510 MG/17 ML: 510.0000 mg | INTRAVENOUS | Status: DC

## 2011-03-02 MED ORDER — FERUMOXYTOL INJECTION 510 MG/17 ML
INTRAVENOUS | Status: AC
  Filled 2011-03-02: qty 17

## 2011-03-02 NOTE — Progress Notes (Signed)
4 nurses attempted to start pt's PIV for her second dose of feraheme and were unsuccessful. Called Crystal, CMA at Dr. Eliane Decree office and was told to reschedule the pt.

## 2011-03-09 ENCOUNTER — Encounter (HOSPITAL_COMMUNITY)
Admission: RE | Admit: 2011-03-09 | Discharge: 2011-03-09 | Disposition: A | Discharge status: Home / Self Care | Payer: PRIVATE HEALTH INSURANCE | Source: Ambulatory Visit | Attending: Nephrology | Admitting: Nephrology

## 2011-03-09 DIAGNOSIS — D509 Iron deficiency anemia, unspecified: Secondary | ICD-10-CM | POA: Insufficient documentation

## 2011-03-09 MED ORDER — FERUMOXYTOL INJECTION 510 MG/17 ML
INTRAVENOUS | Status: AC
  Administered 2011-03-09: 510 mg via INTRAVENOUS
  Filled 2011-03-09: qty 17

## 2011-03-09 MED ORDER — FERUMOXYTOL INJECTION 510 MG/17 ML
510.0000 mg | Freq: Once | INTRAVENOUS | Status: AC
  Administered 2011-03-09: 510 mg via INTRAVENOUS

## 2011-03-18 ENCOUNTER — Ambulatory Visit: Payer: PRIVATE HEALTH INSURANCE | Admitting: Internal Medicine

## 2011-04-22 ENCOUNTER — Telehealth: Payer: Self-pay | Admitting: *Deleted

## 2011-04-22 ENCOUNTER — Ambulatory Visit (INDEPENDENT_AMBULATORY_CARE_PROVIDER_SITE_OTHER): Payer: PRIVATE HEALTH INSURANCE | Admitting: Internal Medicine

## 2011-04-22 ENCOUNTER — Encounter: Payer: Self-pay | Admitting: Internal Medicine

## 2011-04-22 VITALS — BP 154/80 | HR 90 | Temp 98.1°F | Ht 63.0 in | Wt 255.2 lb

## 2011-04-22 DIAGNOSIS — N189 Chronic kidney disease, unspecified: Secondary | ICD-10-CM

## 2011-04-22 DIAGNOSIS — E119 Type 2 diabetes mellitus without complications: Secondary | ICD-10-CM

## 2011-04-22 DIAGNOSIS — D509 Iron deficiency anemia, unspecified: Secondary | ICD-10-CM

## 2011-04-22 DIAGNOSIS — Z79899 Other long term (current) drug therapy: Secondary | ICD-10-CM

## 2011-04-22 LAB — CBC WITH DIFFERENTIAL/PLATELET
Basophils Absolute: 0 10*3/uL (ref 0.0–0.1)
Eosinophils Absolute: 0.1 10*3/uL (ref 0.0–0.7)
Eosinophils Relative: 1 % (ref 0–5)
HCT: 37.6 % (ref 36.0–46.0)
Lymphocytes Relative: 25 % (ref 12–46)
MCH: 22.5 pg — ABNORMAL LOW (ref 26.0–34.0)
MCV: 76.1 fL — ABNORMAL LOW (ref 78.0–100.0)
Monocytes Absolute: 0.7 10*3/uL (ref 0.1–1.0)
RDW: 15.4 % (ref 11.5–15.5)
WBC: 8.5 10*3/uL (ref 4.0–10.5)

## 2011-04-22 LAB — COMPLETE METABOLIC PANEL WITH GFR
ALT: 15 U/L (ref 0–35)
AST: 19 U/L (ref 0–37)
Albumin: 4 g/dL (ref 3.5–5.2)
Alkaline Phosphatase: 71 U/L (ref 39–117)
Chloride: 104 mEq/L (ref 96–112)
Potassium: 4.4 mEq/L (ref 3.5–5.3)
Sodium: 142 mEq/L (ref 135–145)
Total Protein: 7.5 g/dL (ref 6.0–8.3)

## 2011-04-22 LAB — POCT GLYCOSYLATED HEMOGLOBIN (HGB A1C): Hemoglobin A1C: 9.4

## 2011-04-22 LAB — GLUCOSE, CAPILLARY: Glucose-Capillary: 405 mg/dL — ABNORMAL HIGH (ref 70–99)

## 2011-04-22 MED ORDER — GLIPIZIDE ER 5 MG PO TB24: 20.0000 mg | ORAL_TABLET | Freq: Every day | ORAL | Status: DC

## 2011-04-22 NOTE — Patient Instructions (Signed)
Increase glipizide XL (Glucotrol XL) 5 mg to a dose of 4 tablets once a day. Please schedule an appointment with diabetes educator Norm Parcel for diabetes education and instruction in insulin use.

## 2011-04-22 NOTE — Assessment & Plan Note (Addendum)
Creat  Date Value Range Status  01/21/2011 1.95* 0.50-1.10 (mg/dL) Final    Assessment: Patient's creatinine has been relatively stable.  She is followed by Dr. Allena Katz at [facility].  Plan: The plan is to check a metabolic panel today.

## 2011-04-22 NOTE — Telephone Encounter (Signed)
That is fine; I revised the prescription to the correct number.

## 2011-04-22 NOTE — Telephone Encounter (Signed)
Pharmacy just called for clarification of new glipizide script sent electronically today, 4 tabs daily #90... Actually 4 daily would be 120, i approved change, is this ok?

## 2011-04-22 NOTE — Progress Notes (Signed)
  Subjective:    Patient ID: Joan Banks, female    DOB: 03-31-1945, 66 y.o.   MRN: [id number]  HPI Patient returns for followup of her diabetes mellitus, chronic renal insufficiency, and other chronic medical problems.  She has no acute complaints today.  She reports that her blood sugars have not been well controlled, although she has not been checking them frequently since her last visit.  She denies any change in her diet, and she reports that she has been compliant with her medications.   Review of Systems  Respiratory: Positive for shortness of breath (Stable exertional dyspnea, resolves with rest.).   Cardiovascular: Negative for chest pain and leg swelling.  Gastrointestinal: Negative for nausea, vomiting and abdominal pain.  Genitourinary: Negative for frequency and difficulty urinating.       Objective:   Physical Exam  Constitutional: No distress.  Cardiovascular: Normal rate and regular rhythm.   Murmur heard.  Systolic murmur is present with a grade of 2/6  Pulmonary/Chest: Effort normal and breath sounds normal. No respiratory distress. She has no wheezes. She has no rales.  Abdominal: Soft. Bowel sounds are normal. She exhibits no distension. There is no tenderness.  Musculoskeletal: She exhibits no edema.  Skin:           Assessment & Plan:

## 2011-04-22 NOTE — Assessment & Plan Note (Signed)
Hemoglobin A1C  Date Value Range Status  04/22/2011 9.4   Final  01/21/2011 7.6   Final  10/22/2010 7.1   Final      Assessment: Diabetes control: not controlled Progress toward goals: deteriorated Barriers to meeting goals: no barriers identified  Plan: Patient's diabetes is not well controlled, and her control has deteriorated.  She was much better controlled in the past on metformin, but given her renal insufficiency I do not think metformin is an option.  I discussed this at length with her today, and she is willing to start insulin.  The plan is to refer her to [facility] for diabetes education and instruction in insulin administration within the next few days; will then start a low dose of Lantus insulin in addition to her glipizide XL.  For now, will increase glipizide XL to a dose of 20 mg daily.  I advised patient to call by the end of the week to let me know how her blood sugars are doing on the increased dose of glipizide.

## 2011-04-22 NOTE — Assessment & Plan Note (Signed)
  Component Value Date/Time   HGB 9.0* 08/22/2010 0932   HGB 9.2* 11/26/2009 2309   HGB 9.3* 05/01/2009 1048     Assessment: Patient has no active symptoms of anemia.  Plan: Will check a CBC with differential and a ferritin level.

## 2011-04-23 ENCOUNTER — Telehealth: Payer: Self-pay | Admitting: Dietician

## 2011-04-23 NOTE — Telephone Encounter (Signed)
Opened note in error.

## 2011-04-29 ENCOUNTER — Encounter: Payer: Self-pay | Admitting: Internal Medicine

## 2011-04-29 NOTE — Progress Notes (Signed)
Form completed for diabetic shoes.  Patient has peripheral neuropathy with evidence of callus formation noted on her foot exam done 04/22/2011.  Due to this, patient requires the use of diabetic shoes and inserts as part of her diabetic plan of care to prevent diabetic ulcers of the feet.

## 2011-05-07 ENCOUNTER — Ambulatory Visit (INDEPENDENT_AMBULATORY_CARE_PROVIDER_SITE_OTHER): Payer: PRIVATE HEALTH INSURANCE | Admitting: Internal Medicine

## 2011-05-07 ENCOUNTER — Ambulatory Visit (INDEPENDENT_AMBULATORY_CARE_PROVIDER_SITE_OTHER): Payer: PRIVATE HEALTH INSURANCE | Admitting: Dietician

## 2011-05-07 ENCOUNTER — Encounter: Payer: Self-pay | Admitting: Internal Medicine

## 2011-05-07 VITALS — BP 175/85 | HR 87 | Temp 97.0°F | Ht 63.0 in | Wt 257.0 lb

## 2011-05-07 DIAGNOSIS — E1149 Type 2 diabetes mellitus with other diabetic neurological complication: Secondary | ICD-10-CM

## 2011-05-07 DIAGNOSIS — M25559 Pain in unspecified hip: Secondary | ICD-10-CM

## 2011-05-07 DIAGNOSIS — E119 Type 2 diabetes mellitus without complications: Secondary | ICD-10-CM

## 2011-05-07 DIAGNOSIS — E1142 Type 2 diabetes mellitus with diabetic polyneuropathy: Secondary | ICD-10-CM

## 2011-05-07 MED ORDER — ACETAMINOPHEN-CODEINE #3 300-30 MG PO TABS: 1.0000 | ORAL_TABLET | Freq: Three times a day (TID) | ORAL | Status: DC | PRN

## 2011-05-07 MED ORDER — INSULIN GLARGINE 100 UNIT/ML ~~LOC~~ SOLN: 10.0000 [IU] | Freq: Every day | SUBCUTANEOUS | Status: DC

## 2011-05-07 MED ORDER — INSULIN PEN NEEDLE 32G X 6 MM MISC: 1.0000 | Freq: Every evening | Status: DC

## 2011-05-07 MED ORDER — INSULIN GLARGINE 100 UNIT/ML ~~LOC~~ SOLN: 9.0000 [IU] | Freq: Every day | SUBCUTANEOUS | Status: DC

## 2011-05-07 NOTE — Progress Notes (Signed)
Patient ID: Joan Banks, female   DOB: 1945/06/12, 66 y.o.   MRN: [id number] HPI:    1. Right hip pain, intermittent for several months, slightly escalating in frequency and intensity, 7/10 in intensity; no radiculopathy, paresthesias, trauma, increase in gain instability (walks with cane). Worse with ambulation and improves with rest. Requests a Rf on tylenol #3. 2. Dm, type 2. Concerned with her CBG's trending up after being taken off metformin due to CKD. Denies any increase in polydipsia, polyuria, dizziness, N/V, abdominal pain, diarrhea, chest pain, palpitations or any other SX  Review of Systems: Negative except per history of present illness  Physical Exam:  Nursing notes and vitals reviewed General:  alert, well-developed, and cooperative to examination.   Lungs:  normal respiratory effort, no accessory muscle use, normal breath sounds, no crackles, and no wheezes. Heart:  normal rate, regular rhythm, no murmurs, no gallop, and no rub.   Abdomen:  soft, non-tender, normal bowel sounds, no distention, no guarding, no rebound tenderness, no hepatomegaly, and no splenomegaly.   Extremities:  No cyanosis, clubbing, edema Neurologic:  alert & oriented X3, nonfocal exam  Meds: Medications Prior to Admission  Medication Sig Dispense Refill  . albuterol-ipratropium (COMBIVENT) 18-103 MCG/ACT inhaler Inhale 2 puffs into the lungs 4 (four) times daily.  14.7 g  11  . amLODipine (NORVASC) 10 MG tablet Take 1 tablet (10 mg total) by mouth daily.  31 tablet  11  . aspirin 81 MG EC tablet Take 1 tablet (81 mg total) by mouth daily.  100 tablet  3  . atenolol (TENORMIN) 25 MG tablet Take 2 tablets (50 mg total) by mouth 2 (two) times daily.  120 tablet  5  . BD ULTRA-FINE LANCETS lancets Use to test your blood sugar once daily       . Blood Glucose Monitoring Suppl (ACCU-CHEK COMPACT CARE KIT) KIT Use to check blood sugar 2 times daily. Dx code- 250.00  1 each  0  . cetirizine (ZYRTEC) 10  MG tablet Take 1 tablet (10 mg total) by mouth daily.  30 tablet  11  . cloNIDine (CATAPRES) 0.1 MG tablet Take 3 tablets (0.3 mg total) by mouth 2 (two) times daily.  180 tablet  11  . enalapril (VASOTEC) 10 MG tablet Take 2 tablets (20 mg total) by mouth 2 (two) times daily.  120 tablet  3  . ferrous sulfate 325 (65 FE) MG tablet Take 1 tablet (325 mg total) by mouth daily.  30 tablet  3  . furosemide (LASIX) 40 MG tablet Take 1 tablet (40 mg total) by mouth daily.  30 tablet  6  . glucose blood (ACCU-CHEK COMPACT TEST DRUM) test strip Use to check blood sugar 2 times daily. Dx code 250.00  100 each  12  . insulin glargine (LANTUS SOLOSTAR) 100 UNIT/ML injection Inject 10 Units into the skin at bedtime.  10 mL  12  . ipratropium-albuterol (DUONEB) 0.5-2.5 (3) MG/3ML SOLN Take 3 mLs by nebulization 4 (four) times daily as needed. For shortness of breath. Diagnosis: COPD (ICD-9- 496)  360 mL  11  . Lancet Devices (ACCU-CHEK SOFTCLIX) lancets Use to check blood sugar 2 times daily. Dx code 250.00  100 each  11  . latanoprost (XALATAN) 0.005 % ophthalmic solution Use as directed by ophthalmologist       . mometasone (NASONEX) 50 MCG/ACT nasal spray Place 2 sprays into the nose daily.  17 g  11  . Multiple Vitamins-Minerals (RA CENTRAL-VITE  SENIOR) TABS Take 1 tablet by mouth daily.        Marland Kitchen omeprazole (PRILOSEC) 20 MG capsule Take 2 capsules (40 mg total) by mouth daily.  60 capsule  11  . simvastatin (ZOCOR) 20 MG tablet Take 1 tablet (20 mg total) by mouth daily.  31 tablet  6  . sitaGLIPtin (JANUVIA) 50 MG tablet Take 1 tablet (50 mg total) by mouth daily.  30 tablet  6   No current facility-administered medications on file as of 05/07/2011.    Allergies: Review of patient's allergies indicates no known allergies. Past Medical History  Diagnosis Date  . Diabetes mellitus   . Diabetic peripheral neuropathy   . COPD (chronic obstructive pulmonary disease)   . OSA (obstructive sleep apnea)      Sleep study 12/23/2003 showed very severe obstructive sleep apnea/hypopnea syndrome, RDI 136 per hour, with severe oxygen desaturation to 50% on room air before CPAP.  Marland Kitchen Hypertension   . Hyperlipidemia   . Visual impairment   . Anemia, iron deficiency   . 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 Pierce Crane.  Marland Kitchen Uterine leiomyoma     Hx of massive leiomyomata uteri; S/P total abdominal hysterectomy, right salpingo-oophorectomy, and debulking of benign fibroid from vesicouterine space 11/18/99   . Low back pain   . Chronic renal insufficiency   . Dental caries    Past Surgical History  Procedure Date  . Total abdominal hysterectomy 11/18/1999    Hx of massive leiomyomata uteri; S/P total abdominal hysterectomy, right salpingo-oophorectomy, and debulking of benign fibroid from vesicouterine space 11/18/99.  . Lipoma excision 07/14/1998 & 04/14/2001    S/P excision of lipoma, right shoulder measured as 6 cm x 4 cm by Dr. Leonie Man 07/14/1998.  . Breast lumpectomy 02/1997    History of DCIS, status post lumpectomy and sentinel lymph node dissection January 1999; on tamoxifen for 5 years; followed by Dr. Pierce Crane.  . Condyloma excision/fulguration 11/18/1999    Perianal condylomas.   Family History  Problem Relation Age of Onset  . Breast cancer Sister   . Cancer Mother     Posey Rea of type.   History   Social History  . Marital Status: Married    Spouse Name: N/A    Number of Children: N/A  . Years of Education: N/A   Occupational History  . Not on file.   Social History Main Topics  . Smoking status: Former Smoker -- 0.5 packs/day for 30 years    Types: Cigarettes    Quit date: 02/03/2000  . Smokeless tobacco: Never Used  . Alcohol Use: No  . Drug Use: No  . Sexually Active: Not on file   Other Topics Concern  . Not on file   Social History  Narrative  . No narrative on file    A/P: 1. DM, type 2. - managementl is complicated with a concomitant CKD. -Stop Glipizide -start Glargine Solastar 10 Units SQ qhs -Follow up with DMe (Ms. Plyler is to teach insulin administration) -Patient is to monitor CBG's and call if worsened glucose control -f/u with an eye appointment. -F/U in 1 week  2. Right hip pain 2/2 OA - apply low heat to right hip for 10 min qid PRN -RF on Tylenol #3 (pain contract signed). Instructed not to operate any machinery while using this medications -  Fall precautions

## 2011-05-07 NOTE — Patient Instructions (Signed)
Please, Stop taking Glipizide. Please, start injecting Insulin 10 units  Before bedtime daily. Please, check your blood sugar as instructed and call with any concerns either to me or to Ms. Lupita Leash. Please, follow up with your eye exam. Please, make an appointment to see either me or Dr. Meredith Pel in 1-2 weeks and bring your glucose meter.

## 2011-05-07 NOTE — Progress Notes (Signed)
Diabetes Self-Management Training (DSMT)  Initial Visit  05/07/2011 Ms. Joan Banks, identified by name and date of birth, is a 66 y.o. female with Type 2 Diabetes. Year of diabetes diagnosis: 03/10/2006 per problem list Other persons present: no  ASSESSMENT Patient concerns are Medication.  There were no vitals taken for this visit. There is no height or weight on file to calculate BMI. Lab Results  Component Value Date   LDLCALC 36 08/22/2010   Lab Results  Component Value Date   HGBA1C 9.4 04/22/2011   Labs reviewed.  DIABETES BUNDLE: A1C in past 6 months? Yes.  Less than 7%? No  Support systems: significant other And other family, however patient states she does not like to depend on them, is very independent  Special needs: Large print, due to low vision  Prior DM Education: Yes   Medications See Medications list.  Is interested in learning more  Patients belief/attitude about diabetes: Diabetes can be controlled.  Self foot exams daily: Yes  Diabetes Complications: Retinopathy Nephropathy   Exercise Plan Doing ADLs for 30 minutesa day.   Self-Monitoring Frequency of testing: 1-2 times/day Breakfast and supper  Hyperglycemia: Yes Weekly Hypoglycemia: No   Meal Planning Some knowledge   Assessment comments: here today for insulin instruction.   Patient repeated demonstration several times until she was able to repeat demonstration un assisted with > 90% accuracy.   INDIVIDUAL DIABETES EDUCATION PLAN:  Medication Acute complication: _______________________________________________________________________  Intervention TOPICS COVERED TODAY:  Medication  Taught/evaluated insulin injection, site rotation, insulin storage and needle disposal. Reviewed patients medication for diabetes, action, purpose, timing of dose and side effects. Acute complication  Taught treatment of hypoglycemia - the 15 rule.  PATIENTS GOALS/PLAN (copy and paste in patient  instructions so patient receives a copy): 1.  Learning Objective:       Know when to call for assistance in diabetes care 2.  Behavioral Objective:         Medications: To improve blood glucose levels, I will take my medication as prescribed Never 0% Reducing Risk: To decrease the risk for complications, I will treat hypoglycemia with 15 grams of carbs if blood glucose less than 70 mg/dl and and always carry something to treat low blood sugar with you at all times.   Sometimes 25%  Personalized Follow-Up Plan for Ongoing Self Management Support:  Doctor's Office, friends, family and CDE visits ______________________________________________________________________   Outcomes Expected outcomes: Demonstrated interest in learning.Expect positive changes in lifestyle. Self-care Barriers: Impaired vision, Lack of transportation, Lack of material resources Education material provided: yes- 5 step picture instruction on how to use Lantus insulin pen Patient to contact team via Phone if problems or questions. Time in: 1100     Time out: 1130 Future DSMT - 6 months   Chai Verdejo, Lupita Leash

## 2011-05-13 ENCOUNTER — Telehealth: Payer: Self-pay | Admitting: Dietician

## 2011-05-13 DIAGNOSIS — E119 Type 2 diabetes mellitus without complications: Secondary | ICD-10-CM

## 2011-05-13 NOTE — Telephone Encounter (Signed)
Patient called to update [place] about how her blood sugars are doing since starting insulin last week:  She has been taking 10 units of lantus insulin without problems,  She says "one day it was so high she took glipizide and it went down to 129". Fasting sugars have been 189, 234, 228,226, 220

## 2011-05-15 ENCOUNTER — Emergency Department (HOSPITAL_COMMUNITY)
Admission: EM | Admit: 2011-05-15 | Discharge: 2011-05-15 | Discharge status: Against Medical Advice | Payer: PRIVATE HEALTH INSURANCE | Attending: Obstetrics and Gynecology | Admitting: Obstetrics and Gynecology

## 2011-05-15 DIAGNOSIS — Z0389 Encounter for observation for other suspected diseases and conditions ruled out: Secondary | ICD-10-CM | POA: Insufficient documentation

## 2011-05-15 MED ORDER — INSULIN GLARGINE 100 UNIT/ML ~~LOC~~ SOLN: 12.0000 [IU] | Freq: Every day | SUBCUTANEOUS | Status: DC

## 2011-05-15 NOTE — ED Notes (Signed)
No answer when called 

## 2011-05-15 NOTE — Telephone Encounter (Signed)
I called patient today at home and discussed her blood sugars.  She is currently feeling fine with no complaints.  She says that her sugars are somewhat better than earlier in the week; this morning her fasting sugar was 168, yesterday morning 151; on the afternoon of April 10 it was 122.  She is currently taking Lantus 10 units daily and Januvia 50 mg daily.  Her glipizide was stopped when she started the Lantus insulin.  I advised her to increase her Lantus to 12 units daily, and continue Januvia as before.  I also advised her to check her blood sugar at least twice a day, and to call results to [facility] next week.

## 2011-05-25 ENCOUNTER — Other Ambulatory Visit (HOSPITAL_COMMUNITY): Payer: Self-pay | Admitting: *Deleted

## 2011-05-26 ENCOUNTER — Encounter (HOSPITAL_COMMUNITY)
Admission: RE | Admit: 2011-05-26 | Discharge: 2011-05-26 | Disposition: A | Discharge status: Home / Self Care | Payer: PRIVATE HEALTH INSURANCE | Source: Ambulatory Visit | Attending: Nephrology | Admitting: Nephrology

## 2011-05-26 DIAGNOSIS — D509 Iron deficiency anemia, unspecified: Secondary | ICD-10-CM | POA: Insufficient documentation

## 2011-05-26 LAB — IRON AND TIBC
Saturation Ratios: 24 % (ref 20–55)
TIBC: 221 ug/dL — ABNORMAL LOW (ref 250–470)

## 2011-05-26 LAB — FERRITIN: Ferritin: 366 ng/mL — ABNORMAL HIGH (ref 10–291)

## 2011-05-26 LAB — POCT HEMOGLOBIN-HEMACUE: Hemoglobin: 11.4 g/dL — ABNORMAL LOW (ref 12.0–15.0)

## 2011-05-26 MED ORDER — EPOETIN ALFA 10000 UNIT/ML IJ SOLN: 10000.0000 [IU] | INTRAMUSCULAR | Status: DC

## 2011-05-27 ENCOUNTER — Telehealth: Payer: Self-pay | Admitting: Dietician

## 2011-05-27 NOTE — Telephone Encounter (Signed)
Called patient because i had not heard form her about her blood sugars since lantus increase. She confirms that she is taking 50 mg of Januvia and 12 units of lantus Today: CBgs is 220,  Not sure what times and days the following blood sugars are, says she's been taking her blood sugar 2x a day most days: 207.259, 226, 180, 249, 207, 144, 198, 190, 144, 180, 134, 197, 131, 199, 201, 202, 165, 168, 151,122.   Reiterated how she can tell when pen is empty and also storage of lantus. (She had pens in use in frig and unused pens out of frig). Told her that this message would be routed to her physician.

## 2011-05-31 ENCOUNTER — Other Ambulatory Visit: Payer: Self-pay | Admitting: Internal Medicine

## 2011-06-02 ENCOUNTER — Telehealth: Payer: Self-pay | Admitting: Internal Medicine

## 2011-06-02 NOTE — Telephone Encounter (Signed)
I called patient today and discussed her home CBG measurements; she reports that she restarted glipizide XL 20 mg daily 1 week ago and she has had no low blood sugars or symptoms of hypoglycemia; her lowest value today has been above 130.  I advised her not to skip meals and that if she does have any low values or hypoglycemic symptoms she should let us know right away and reduce or hold the glipizide.  I offered to see her in my clinic tomorrow but she is unable to arrange transportation; she will schedule  follow up in clinic when transportation can be arranged.

## 2011-06-02 NOTE — Telephone Encounter (Signed)
I agree that these CBG results look OK but I would not want them to go lower; if she has any lower values, then she should reduce or hold the glipizide pending her office visit.

## 2011-06-02 NOTE — Telephone Encounter (Signed)
Called to give [place] more blood sugars: Reports she has been taking 1 januvia, 4 glipizide per day ( 2 in the am and 2 in the pm)  and 12 units of insulin. (She added glipizide back because blood sugars were too high.) No signs or symptoms of low blood sugar. Told her to hold with what she is currently taking and that we would not want her blood sugars much lower.  133/161/125/113/203/182/103/135/ has been taking glipizide for at least the past 4 days.  Will request front office call her for an appointment per Dr. Wendie Chess last office visit

## 2011-06-06 ENCOUNTER — Other Ambulatory Visit: Payer: Self-pay | Admitting: Internal Medicine

## 2011-06-08 NOTE — Telephone Encounter (Signed)
I refilled this on 05/07/11 with 2 additional refills; please contact pharmacy and get more information.

## 2011-06-09 ENCOUNTER — Encounter (HOSPITAL_COMMUNITY)
Admission: RE | Admit: 2011-06-09 | Discharge: 2011-06-09 | Disposition: A | Discharge status: Home / Self Care | Payer: PRIVATE HEALTH INSURANCE | Source: Ambulatory Visit | Attending: Nephrology | Admitting: Nephrology

## 2011-06-09 DIAGNOSIS — D509 Iron deficiency anemia, unspecified: Secondary | ICD-10-CM | POA: Insufficient documentation

## 2011-06-09 LAB — RENAL FUNCTION PANEL
CO2: 25 mEq/L (ref 19–32)
Calcium: 9.8 mg/dL (ref 8.4–10.5)
Chloride: 109 mEq/L (ref 96–112)
GFR calc Af Amer: 44 mL/min — ABNORMAL LOW (ref >90)
GFR calc non Af Amer: 38 mL/min — ABNORMAL LOW (ref >90)
Glucose, Bld: 248 mg/dL — ABNORMAL HIGH (ref 70–99)
Sodium: 143 mEq/L (ref 135–145)

## 2011-06-09 LAB — HEMOGLOBIN AND HEMATOCRIT, BLOOD
HCT: 32.7 % — ABNORMAL LOW (ref 36.0–46.0)
Hemoglobin: 10.2 g/dL — ABNORMAL LOW (ref 12.0–15.0)

## 2011-06-09 LAB — MAGNESIUM: Magnesium: 1.5 mg/dL (ref 1.5–2.5)

## 2011-06-09 MED ORDER — EPOETIN ALFA 10000 UNIT/ML IJ SOLN
INTRAMUSCULAR | Status: AC
  Administered 2011-06-09: 10000 [IU] via SUBCUTANEOUS
  Filled 2011-06-09: qty 1

## 2011-06-09 MED ORDER — EPOETIN ALFA 10000 UNIT/ML IJ SOLN
10000.0000 [IU] | INTRAMUSCULAR | Status: DC
  Administered 2011-06-09: 10000 [IU] via SUBCUTANEOUS

## 2011-06-10 LAB — VITAMIN D 25 HYDROXY (VIT D DEFICIENCY, FRACTURES): Vit D, 25-Hydroxy: 38 ng/mL (ref 30–89)

## 2011-06-12 NOTE — Telephone Encounter (Signed)
Can you please close out this encounter? I am not authorized.

## 2011-06-12 NOTE — Telephone Encounter (Signed)
I called pharmacy and they never received Rx for 4/4.  It was a print Rx so may of not been called in???  I called it in with 2 refills.

## 2011-06-15 ENCOUNTER — Encounter (HOSPITAL_COMMUNITY)
Admission: RE | Admit: 2011-06-15 | Discharge: 2011-06-15 | Disposition: A | Discharge status: Home / Self Care | Payer: PRIVATE HEALTH INSURANCE | Source: Ambulatory Visit | Attending: Nephrology | Admitting: Nephrology

## 2011-06-15 MED ORDER — EPOETIN ALFA 10000 UNIT/ML IJ SOLN
10000.0000 [IU] | INTRAMUSCULAR | Status: DC
  Administered 2011-06-15: 10000 [IU] via SUBCUTANEOUS

## 2011-06-15 MED ORDER — EPOETIN ALFA 10000 UNIT/ML IJ SOLN
INTRAMUSCULAR | Status: AC
  Administered 2011-06-15: 10000 [IU] via SUBCUTANEOUS
  Filled 2011-06-15: qty 1

## 2011-06-16 ENCOUNTER — Other Ambulatory Visit (HOSPITAL_COMMUNITY): Payer: Self-pay | Admitting: *Deleted

## 2011-06-19 ENCOUNTER — Telehealth: Payer: Self-pay | Admitting: *Deleted

## 2011-06-19 ENCOUNTER — Other Ambulatory Visit: Payer: Self-pay | Admitting: *Deleted

## 2011-06-19 NOTE — Telephone Encounter (Signed)
Opened in error

## 2011-06-19 NOTE — Telephone Encounter (Signed)
Pt wanted [facility] to call [employer] [facility] testing supplies at [phone] - on med info states testing two times a day. Stanton Kidney Brookelle Pellicane RN 06/19/11 3:15PM

## 2011-06-22 ENCOUNTER — Encounter (HOSPITAL_COMMUNITY)
Admission: RE | Admit: 2011-06-22 | Discharge: 2011-06-22 | Disposition: A | Discharge status: Home / Self Care | Payer: PRIVATE HEALTH INSURANCE | Source: Ambulatory Visit | Attending: Nephrology | Admitting: Nephrology

## 2011-06-22 LAB — BASIC METABOLIC PANEL
BUN: 31 mg/dL — ABNORMAL HIGH (ref 6–23)
Calcium: 10.1 mg/dL (ref 8.4–10.5)
Creatinine, Ser: 1.97 mg/dL — ABNORMAL HIGH (ref 0.50–1.10)
GFR calc Af Amer: 29 mL/min — ABNORMAL LOW (ref >90)
GFR calc non Af Amer: 25 mL/min — ABNORMAL LOW (ref >90)
Glucose, Bld: 197 mg/dL — ABNORMAL HIGH (ref 70–99)
Potassium: 4.5 mEq/L (ref 3.5–5.1)

## 2011-06-22 MED ORDER — EPOETIN ALFA 10000 UNIT/ML IJ SOLN
10000.0000 [IU] | INTRAMUSCULAR | Status: DC
  Administered 2011-06-22: 10000 [IU] via SUBCUTANEOUS
  Filled 2011-06-22: qty 1

## 2011-07-02 ENCOUNTER — Encounter (HOSPITAL_COMMUNITY)
Admission: RE | Admit: 2011-07-02 | Discharge: 2011-07-02 | Disposition: A | Discharge status: Home / Self Care | Payer: PRIVATE HEALTH INSURANCE | Source: Ambulatory Visit | Attending: Nephrology | Admitting: Nephrology

## 2011-07-02 LAB — IRON AND TIBC: UIBC: 157 ug/dL (ref 125–400)

## 2011-07-02 LAB — POCT HEMOGLOBIN-HEMACUE: Hemoglobin: 10.5 g/dL — ABNORMAL LOW (ref 12.0–15.0)

## 2011-07-02 LAB — FERRITIN: Ferritin: 396 ng/mL — ABNORMAL HIGH (ref 10–291)

## 2011-07-02 MED ORDER — EPOETIN ALFA 10000 UNIT/ML IJ SOLN
10000.0000 [IU] | INTRAMUSCULAR | Status: DC
  Administered 2011-07-02: 10000 [IU] via SUBCUTANEOUS
  Filled 2011-07-02: qty 1

## 2011-07-09 ENCOUNTER — Encounter (HOSPITAL_COMMUNITY)
Admission: RE | Admit: 2011-07-09 | Discharge: 2011-07-09 | Disposition: A | Discharge status: Home / Self Care | Payer: PRIVATE HEALTH INSURANCE | Source: Ambulatory Visit | Attending: Nephrology | Admitting: Nephrology

## 2011-07-09 DIAGNOSIS — D509 Iron deficiency anemia, unspecified: Secondary | ICD-10-CM | POA: Insufficient documentation

## 2011-07-09 MED ORDER — EPOETIN ALFA 10000 UNIT/ML IJ SOLN: 10000.0000 [IU] | INTRAMUSCULAR | Status: DC

## 2011-07-16 ENCOUNTER — Encounter (HOSPITAL_COMMUNITY): Payer: PRIVATE HEALTH INSURANCE

## 2011-07-23 ENCOUNTER — Encounter (HOSPITAL_COMMUNITY)
Admission: RE | Admit: 2011-07-23 | Discharge: 2011-07-23 | Disposition: A | Discharge status: Home / Self Care | Payer: PRIVATE HEALTH INSURANCE | Source: Ambulatory Visit | Attending: Nephrology | Admitting: Nephrology

## 2011-07-23 LAB — POCT HEMOGLOBIN-HEMACUE: Hemoglobin: 10.1 g/dL — ABNORMAL LOW (ref 12.0–15.0)

## 2011-07-23 MED ORDER — EPOETIN ALFA 10000 UNIT/ML IJ SOLN
10000.0000 [IU] | INTRAMUSCULAR | Status: DC
  Administered 2011-07-23: 10000 [IU] via SUBCUTANEOUS

## 2011-07-29 ENCOUNTER — Ambulatory Visit (INDEPENDENT_AMBULATORY_CARE_PROVIDER_SITE_OTHER): Payer: PRIVATE HEALTH INSURANCE | Admitting: Internal Medicine

## 2011-07-29 ENCOUNTER — Encounter: Payer: Self-pay | Admitting: Internal Medicine

## 2011-07-29 VITALS — BP 150/76 | HR 85 | Temp 98.1°F | Ht 63.0 in | Wt 254.6 lb

## 2011-07-29 DIAGNOSIS — E119 Type 2 diabetes mellitus without complications: Secondary | ICD-10-CM

## 2011-07-29 DIAGNOSIS — Z79899 Other long term (current) drug therapy: Secondary | ICD-10-CM

## 2011-07-29 LAB — POCT GLYCOSYLATED HEMOGLOBIN (HGB A1C): Hemoglobin A1C: 7.1

## 2011-07-29 MED ORDER — INSULIN GLARGINE 100 UNIT/ML ~~LOC~~ SOLN: 14.0000 [IU] | Freq: Every day | SUBCUTANEOUS | Status: DC

## 2011-07-29 NOTE — Progress Notes (Signed)
  Subjective:    Patient ID: Joan Banks, female    DOB: May 28, 1945, 66 y.o.   MRN: [id number]  HPI Patient returns for followup of her diabetes mellitus.  She has done well since starting a low dose of Lantus insulin and since she resumed taking her glipizide XL.  Her blood sugars over the past month have averaged 187, with a low of 117 and a high of 335 (see glucose meter download).  She reports occasional pains on the soles of her feet, but has no other complaint.  She reports that she has been compliant with her medications.  Review of Systems  Constitutional: Negative for fever and chills.  Respiratory: Negative for shortness of breath.   Cardiovascular: Positive for leg swelling. Negative for chest pain.  Gastrointestinal: Negative for nausea, vomiting and abdominal pain.  Genitourinary: Negative for dysuria and frequency.       Objective:   Physical Exam  Constitutional: No distress.  Cardiovascular: Normal rate, regular rhythm, S1 normal and S2 normal.  Exam reveals no S3 and no S4.   Murmur heard.  Systolic murmur is present with a grade of 2/6   No diastolic murmur is present  Pulmonary/Chest: Effort normal and breath sounds normal. No respiratory distress. She has no wheezes. She has no rales.  Abdominal: Soft. Bowel sounds are normal. She exhibits no distension. There is no tenderness. There is no rebound and no guarding.  Musculoskeletal: She exhibits no edema.       Assessment & Plan:

## 2011-07-29 NOTE — Patient Instructions (Addendum)
Increase Lantus insulin to a dose of 14 units once a day.

## 2011-07-29 NOTE — Assessment & Plan Note (Signed)
Hemoglobin A1C  Date Value Range Status  07/29/2011 7.1   Final  04/22/2011 9.4   Final  01/21/2011 7.6   Final     Assessment: Diabetes control: Hemoglobin A1c slightly above target of 7 Progress toward goals: improved Barriers to meeting goals: no barriers identified  Plan: Diabetes treatment: Increase Lantus insulin to 14 units daily; continue glipizide XL and Januvia at current dose. Refer to: none Instruction/counseling given: reminded to get eye exam

## 2011-07-30 ENCOUNTER — Encounter (HOSPITAL_COMMUNITY)
Admission: RE | Admit: 2011-07-30 | Discharge: 2011-07-30 | Disposition: A | Discharge status: Home / Self Care | Payer: PRIVATE HEALTH INSURANCE | Source: Ambulatory Visit | Attending: Nephrology | Admitting: Nephrology

## 2011-07-30 ENCOUNTER — Telehealth: Payer: Self-pay | Admitting: *Deleted

## 2011-07-30 LAB — POCT HEMOGLOBIN-HEMACUE
Hemoglobin: 9.8 g/dL — ABNORMAL LOW (ref 12.0–15.0)
Hemoglobin: 7.6 g/dL — ABNORMAL LOW (ref 12.0–15.0)

## 2011-07-30 LAB — IRON AND TIBC: Saturation Ratios: 33 % (ref 20–55)

## 2011-07-30 LAB — FERRITIN: Ferritin: 416 ng/mL — ABNORMAL HIGH (ref 10–291)

## 2011-07-30 MED ORDER — EPOETIN ALFA 10000 UNIT/ML IJ SOLN
10000.0000 [IU] | INTRAMUSCULAR | Status: DC
  Administered 2011-07-30: 10000 [IU] via SUBCUTANEOUS

## 2011-07-30 MED ORDER — EPOETIN ALFA 10000 UNIT/ML IJ SOLN
INTRAMUSCULAR | Status: AC
  Administered 2011-07-30: 10000 [IU] via SUBCUTANEOUS
  Filled 2011-07-30: qty 1

## 2011-07-30 NOTE — Telephone Encounter (Signed)
Opened in error

## 2011-08-05 ENCOUNTER — Encounter (HOSPITAL_COMMUNITY): Payer: PRIVATE HEALTH INSURANCE

## 2011-08-07 ENCOUNTER — Encounter (HOSPITAL_COMMUNITY)
Admission: RE | Admit: 2011-08-07 | Discharge: 2011-08-07 | Disposition: A | Discharge status: Home / Self Care | Payer: PRIVATE HEALTH INSURANCE | Source: Ambulatory Visit | Attending: Nephrology | Admitting: Nephrology

## 2011-08-07 DIAGNOSIS — D509 Iron deficiency anemia, unspecified: Secondary | ICD-10-CM | POA: Insufficient documentation

## 2011-08-07 MED ORDER — EPOETIN ALFA 10000 UNIT/ML IJ SOLN
10000.0000 [IU] | INTRAMUSCULAR | Status: DC
  Administered 2011-08-07: 10000 [IU] via SUBCUTANEOUS

## 2011-08-07 MED ORDER — EPOETIN ALFA 10000 UNIT/ML IJ SOLN
INTRAMUSCULAR | Status: AC
  Administered 2011-08-07: 10000 [IU] via SUBCUTANEOUS
  Filled 2011-08-07: qty 1

## 2011-08-13 ENCOUNTER — Encounter (HOSPITAL_COMMUNITY)
Admission: RE | Admit: 2011-08-13 | Discharge: 2011-08-13 | Disposition: A | Discharge status: Home / Self Care | Payer: PRIVATE HEALTH INSURANCE | Source: Ambulatory Visit | Attending: Nephrology | Admitting: Nephrology

## 2011-08-13 MED ORDER — EPOETIN ALFA 10000 UNIT/ML IJ SOLN
10000.0000 [IU] | INTRAMUSCULAR | Status: DC
  Administered 2011-08-13: 10000 [IU] via SUBCUTANEOUS

## 2011-08-13 MED ORDER — EPOETIN ALFA 10000 UNIT/ML IJ SOLN
INTRAMUSCULAR | Status: AC
  Filled 2011-08-13: qty 1

## 2011-08-20 ENCOUNTER — Encounter (HOSPITAL_COMMUNITY)
Admission: RE | Admit: 2011-08-20 | Discharge: 2011-08-20 | Disposition: A | Discharge status: Home / Self Care | Payer: PRIVATE HEALTH INSURANCE | Source: Ambulatory Visit | Attending: Nephrology | Admitting: Nephrology

## 2011-08-20 MED ORDER — EPOETIN ALFA 10000 UNIT/ML IJ SOLN
10000.0000 [IU] | INTRAMUSCULAR | Status: DC
  Administered 2011-08-20: 10000 [IU] via SUBCUTANEOUS
  Filled 2011-08-20: qty 1

## 2011-08-27 ENCOUNTER — Encounter (HOSPITAL_COMMUNITY)
Admission: RE | Admit: 2011-08-27 | Discharge: 2011-08-27 | Disposition: A | Discharge status: Home / Self Care | Payer: PRIVATE HEALTH INSURANCE | Source: Ambulatory Visit | Attending: Nephrology | Admitting: Nephrology

## 2011-08-27 LAB — POCT HEMOGLOBIN-HEMACUE: Hemoglobin: 11 g/dL — ABNORMAL LOW (ref 12.0–15.0)

## 2011-08-27 MED ORDER — EPOETIN ALFA 10000 UNIT/ML IJ SOLN: 10000.0000 [IU] | INTRAMUSCULAR | Status: DC

## 2011-08-28 LAB — FERRITIN: Ferritin: 390 ng/mL — ABNORMAL HIGH (ref 10–291)

## 2011-08-28 LAB — IRON AND TIBC
Iron: 63 ug/dL (ref 42–135)
Saturation Ratios: 28 % (ref 20–55)
TIBC: 224 ug/dL — ABNORMAL LOW (ref 250–470)

## 2011-08-31 ENCOUNTER — Other Ambulatory Visit: Payer: Self-pay | Admitting: Internal Medicine

## 2011-09-01 ENCOUNTER — Other Ambulatory Visit: Payer: Self-pay | Admitting: Internal Medicine

## 2011-09-01 DIAGNOSIS — Z1231 Encounter for screening mammogram for malignant neoplasm of breast: Secondary | ICD-10-CM

## 2011-09-09 ENCOUNTER — Other Ambulatory Visit (HOSPITAL_COMMUNITY): Payer: Self-pay | Admitting: *Deleted

## 2011-09-10 ENCOUNTER — Encounter (HOSPITAL_COMMUNITY)
Admission: RE | Admit: 2011-09-10 | Discharge: 2011-09-10 | Disposition: A | Discharge status: Home / Self Care | Payer: PRIVATE HEALTH INSURANCE | Source: Ambulatory Visit | Attending: Nephrology | Admitting: Nephrology

## 2011-09-10 DIAGNOSIS — D509 Iron deficiency anemia, unspecified: Secondary | ICD-10-CM | POA: Insufficient documentation

## 2011-09-10 MED ORDER — CLONIDINE HCL 0.1 MG PO TABS: 0.1000 mg | ORAL_TABLET | ORAL | Status: DC | PRN

## 2011-09-10 MED ORDER — EPOETIN ALFA 10000 UNIT/ML IJ SOLN
INTRAMUSCULAR | Status: AC
  Filled 2011-09-10: qty 1

## 2011-09-10 MED ORDER — EPOETIN ALFA 10000 UNIT/ML IJ SOLN
10000.0000 [IU] | INTRAMUSCULAR | Status: DC
  Administered 2011-09-10: 10000 [IU] via SUBCUTANEOUS

## 2011-09-11 ENCOUNTER — Ambulatory Visit
Admission: RE | Admit: 2011-09-11 | Discharge: 2011-09-11 | Disposition: A | Discharge status: Home / Self Care | Payer: PRIVATE HEALTH INSURANCE | Source: Ambulatory Visit | Attending: Internal Medicine | Admitting: Internal Medicine

## 2011-09-11 DIAGNOSIS — Z1231 Encounter for screening mammogram for malignant neoplasm of breast: Secondary | ICD-10-CM

## 2011-09-16 ENCOUNTER — Ambulatory Visit (INDEPENDENT_AMBULATORY_CARE_PROVIDER_SITE_OTHER): Payer: PRIVATE HEALTH INSURANCE | Admitting: Internal Medicine

## 2011-09-16 ENCOUNTER — Encounter: Payer: Self-pay | Admitting: Internal Medicine

## 2011-09-16 VITALS — BP 146/79 | HR 87 | Temp 97.0°F | Ht 63.0 in | Wt 258.6 lb

## 2011-09-16 DIAGNOSIS — E119 Type 2 diabetes mellitus without complications: Secondary | ICD-10-CM

## 2011-09-16 DIAGNOSIS — G4730 Sleep apnea, unspecified: Secondary | ICD-10-CM

## 2011-09-16 DIAGNOSIS — I129 Hypertensive chronic kidney disease with stage 1 through stage 4 chronic kidney disease, or unspecified chronic kidney disease: Secondary | ICD-10-CM

## 2011-09-16 DIAGNOSIS — N189 Chronic kidney disease, unspecified: Secondary | ICD-10-CM

## 2011-09-16 DIAGNOSIS — I10 Essential (primary) hypertension: Secondary | ICD-10-CM

## 2011-09-16 DIAGNOSIS — E785 Hyperlipidemia, unspecified: Secondary | ICD-10-CM

## 2011-09-16 DIAGNOSIS — Z0000 Encounter for general adult medical examination without abnormal findings: Secondary | ICD-10-CM | POA: Insufficient documentation

## 2011-09-16 LAB — COMPLETE METABOLIC PANEL WITH GFR
Albumin: 3.9 g/dL (ref 3.5–5.2)
BUN: 30 mg/dL — ABNORMAL HIGH (ref 6–23)
CO2: 25 mEq/L (ref 19–32)
GFR, Est African American: 38 mL/min — ABNORMAL LOW
GFR, Est Non African American: 33 mL/min — ABNORMAL LOW
Glucose, Bld: 191 mg/dL — ABNORMAL HIGH (ref 70–99)
Sodium: 144 mEq/L (ref 135–145)
Total Bilirubin: 0.2 mg/dL — ABNORMAL LOW (ref 0.3–1.2)
Total Protein: 7.2 g/dL (ref 6.0–8.3)

## 2011-09-16 LAB — LIPID PANEL
Cholesterol: 118 mg/dL (ref 0–200)
HDL: 33 mg/dL — ABNORMAL LOW (ref >39)

## 2011-09-16 MED ORDER — ZOSTER VACCINE LIVE 19400 UNT/0.65ML ~~LOC~~ SOLR: 0.6500 mL | Freq: Once | SUBCUTANEOUS | Status: AC

## 2011-09-16 MED ORDER — INSULIN GLARGINE 100 UNIT/ML ~~LOC~~ SOLN: 24.0000 [IU] | Freq: Every day | SUBCUTANEOUS | Status: DC

## 2011-09-16 NOTE — Assessment & Plan Note (Signed)
  Component Value Date/Time   CREATININE 1.97* 06/22/2011 1301   CREATININE 1.40* 06/09/2011 1052   CREATININE 1.71* 04/22/2011 1154    Assessment: Patient is followed by nephrologist Dr. Allena Katz.  Her creatinine has been reasonably stable.  Plan: Will check a metabolic panel today; patient will followup with Dr. Allena Katz.

## 2011-09-16 NOTE — Assessment & Plan Note (Signed)
Patient inquired about the Zostavax vaccine.  I discussed this with her, and provided a prescription at her request for her to take to her pharmacy; I also gave her a vaccine information statement from the CDC describing the vaccine.

## 2011-09-16 NOTE — Progress Notes (Signed)
  Subjective:    Patient ID: Joan Banks, female    DOB: 1945-06-25, 66 y.o.   MRN: [id number]  HPI Patient returns for followup of her diabetes mellitus, hyperlipidemia, hypertension, and other chronic medical problems.  She has no acute complaints today, and reports that she has been doing well.  She does mention occasional brief episodes of pain in her left forearm proximal to her left thumb which last for only a minute or so and then resolve; these are sometimes aggravated by arm or wrist movement.  She has no pain today.  She continues to wear her CPAP as directed.  She reports that she has been compliant with her medications.  Her home blood glucose measurements have been generally high, with 88% of values above target range over the past month.  Patient reports that about 2 weeks ago she titrated her Lantus insulin up to a dose of 20 units daily, and then saw some improvement in her hyperglycemia; since then her blood sugars have ranged from 117-230.  She has had no episodes of hypoglycemia.   Review of Systems  Respiratory: Negative for shortness of breath and wheezing.   Cardiovascular: Positive for leg swelling (Chronic stable mild leg edema.). Negative for chest pain.  Gastrointestinal: Negative for nausea, vomiting and abdominal pain.  Genitourinary: Negative for dysuria and frequency.  Musculoskeletal: Negative for myalgias.        Objective:   Physical Exam  Cardiovascular: Normal rate, regular rhythm, S1 normal and S2 normal.  Exam reveals no gallop, no S3 and no S4.   Murmur heard.  Systolic murmur is present with a grade of 2/6       1+ bilateral leg edema.  Pulmonary/Chest: Effort normal and breath sounds normal. No respiratory distress. She has no wheezes. She has no rales.  Musculoskeletal: She exhibits no tenderness.       Arms:      Assessment & Plan:

## 2011-09-16 NOTE — Assessment & Plan Note (Addendum)
Lipids:    Component Value Date/Time   CHOL 97 08/22/2010 0932   TRIG 134 08/22/2010 0932   HDL 34* 08/22/2010 0932   LDLCALC 36 08/22/2010 0932   VLDL 27 08/22/2010 0932   CHOLHDL 2.9 08/22/2010 0932    Assessment: Patient is doing well on simvastatin 20 mg daily, without apparent side effects.  Plan: Will check a lipid panel and comprehensive metabolic panel today; continue simvastatin at current dose pending those results.

## 2011-09-16 NOTE — Assessment & Plan Note (Signed)
Hemoglobin A1C  Date Value Range Status  07/29/2011 7.1   Final  04/22/2011 9.4   Final  01/21/2011 7.6   Final     Assessment: Diabetes control: Hemoglobin A1c has improved from March of this year, but is still above target range. Progress toward goals: improved Barriers to meeting goals: no barriers identified  Plan: Diabetes treatment: Increase Lantus insulin to a dose of 24 units daily; continue glipizide XL 20 mg daily; continue Januvia 50 mg daily. Refer to: none Instruction/counseling given: reminded to get eye exam

## 2011-09-16 NOTE — Assessment & Plan Note (Signed)
SpO2 Readings from Last 3 Encounters:  09/16/11 99%  07/29/11 94%  06/22/11 94%   Assessment: Patient reports that she is compliant with her CPAP; she has no problems with daytime somnolence or morning headaches.  Plan: Continue CPAP.

## 2011-09-16 NOTE — Patient Instructions (Signed)
Increase Lantus insulin to a dose of 24 units once daily. Continue to check blood sugars twice a day and bring meter and log to each visit.

## 2011-09-16 NOTE — Assessment & Plan Note (Signed)
Lab Results  Component Value Date   NA 141 06/22/2011   K 4.5 06/22/2011   CL 104 06/22/2011   CO2 27 06/22/2011   BUN 31* 06/22/2011   CREATININE 1.97* 06/22/2011    BP Readings from Last 3 Encounters:  09/16/11 146/79  09/10/11 135/75  08/27/11 122/69    Assessment: Hypertension control:  mildly elevated  Progress toward goals:  unchanged Barriers to meeting goals:  no barriers identified  Plan: Hypertension treatment:  Continue current regimen of amlodipine 10 mg daily, atenolol 50 mg twice a day, clonidine 0.3 mg twice a day, furosemide 40 mg daily and enalapril 20 mg twice a day.

## 2011-09-17 ENCOUNTER — Encounter (HOSPITAL_COMMUNITY): Payer: PRIVATE HEALTH INSURANCE

## 2011-09-24 ENCOUNTER — Other Ambulatory Visit (HOSPITAL_COMMUNITY): Payer: Self-pay | Admitting: *Deleted

## 2011-09-24 ENCOUNTER — Encounter (HOSPITAL_COMMUNITY)
Admission: RE | Admit: 2011-09-24 | Discharge: 2011-09-24 | Disposition: A | Discharge status: Home / Self Care | Payer: PRIVATE HEALTH INSURANCE | Source: Ambulatory Visit | Attending: Nephrology | Admitting: Nephrology

## 2011-09-24 LAB — MAGNESIUM: Magnesium: 1.5 mg/dL (ref 1.5–2.5)

## 2011-09-24 LAB — RENAL FUNCTION PANEL
CO2: 28 mEq/L (ref 19–32)
GFR calc Af Amer: 38 mL/min — ABNORMAL LOW (ref >90)
Glucose, Bld: 197 mg/dL — ABNORMAL HIGH (ref 70–99)
Potassium: 3.8 mEq/L (ref 3.5–5.1)
Sodium: 145 mEq/L (ref 135–145)

## 2011-09-24 LAB — HEMOGLOBIN AND HEMATOCRIT, BLOOD
HCT: 34.6 % — ABNORMAL LOW (ref 36.0–46.0)
Hemoglobin: 10.7 g/dL — ABNORMAL LOW (ref 12.0–15.0)

## 2011-09-24 MED ORDER — CLONIDINE HCL 0.1 MG PO TABS: 0.1000 mg | ORAL_TABLET | ORAL | Status: DC | PRN

## 2011-09-24 MED ORDER — EPOETIN ALFA 10000 UNIT/ML IJ SOLN
10000.0000 [IU] | INTRAMUSCULAR | Status: DC
  Administered 2011-09-24: 10000 [IU] via SUBCUTANEOUS
  Filled 2011-09-24: qty 1

## 2011-09-25 LAB — FERRITIN: Ferritin: 484 ng/mL — ABNORMAL HIGH (ref 10–291)

## 2011-09-25 LAB — IRON AND TIBC
Iron: 67 ug/dL (ref 42–135)
TIBC: 240 ug/dL — ABNORMAL LOW (ref 250–470)
UIBC: 173 ug/dL (ref 125–400)

## 2011-09-25 LAB — VITAMIN D 25 HYDROXY (VIT D DEFICIENCY, FRACTURES): Vit D, 25-Hydroxy: 36 ng/mL (ref 30–89)

## 2011-10-02 ENCOUNTER — Encounter (HOSPITAL_COMMUNITY)
Admission: RE | Admit: 2011-10-02 | Discharge: 2011-10-02 | Disposition: A | Discharge status: Home / Self Care | Payer: PRIVATE HEALTH INSURANCE | Source: Ambulatory Visit | Attending: Nephrology | Admitting: Nephrology

## 2011-10-02 LAB — POCT HEMOGLOBIN-HEMACUE: Hemoglobin: 10.3 g/dL — ABNORMAL LOW (ref 12.0–15.0)

## 2011-10-02 MED ORDER — EPOETIN ALFA 10000 UNIT/ML IJ SOLN
10000.0000 [IU] | INTRAMUSCULAR | Status: DC
  Administered 2011-10-02: 10000 [IU] via SUBCUTANEOUS

## 2011-10-02 MED ORDER — EPOETIN ALFA 10000 UNIT/ML IJ SOLN
INTRAMUSCULAR | Status: AC
  Filled 2011-10-02: qty 1

## 2011-10-09 ENCOUNTER — Encounter (HOSPITAL_COMMUNITY)
Admission: RE | Admit: 2011-10-09 | Discharge: 2011-10-09 | Disposition: A | Discharge status: Home / Self Care | Payer: PRIVATE HEALTH INSURANCE | Source: Ambulatory Visit | Attending: Nephrology | Admitting: Nephrology

## 2011-10-09 DIAGNOSIS — D509 Iron deficiency anemia, unspecified: Secondary | ICD-10-CM | POA: Insufficient documentation

## 2011-10-09 MED ORDER — EPOETIN ALFA 10000 UNIT/ML IJ SOLN
10000.0000 [IU] | INTRAMUSCULAR | Status: DC
  Administered 2011-10-09: 10000 [IU] via SUBCUTANEOUS

## 2011-10-09 MED ORDER — EPOETIN ALFA 10000 UNIT/ML IJ SOLN
INTRAMUSCULAR | Status: AC
  Administered 2011-10-09: 10000 [IU] via SUBCUTANEOUS
  Filled 2011-10-09: qty 1

## 2011-10-16 ENCOUNTER — Encounter (HOSPITAL_COMMUNITY)
Admission: RE | Admit: 2011-10-16 | Discharge: 2011-10-16 | Disposition: A | Discharge status: Home / Self Care | Payer: PRIVATE HEALTH INSURANCE | Source: Ambulatory Visit | Attending: Nephrology | Admitting: Nephrology

## 2011-10-16 MED ORDER — CLONIDINE HCL 0.1 MG PO TABS: 0.1000 mg | ORAL_TABLET | ORAL | Status: DC | PRN

## 2011-10-16 MED ORDER — EPOETIN ALFA 10000 UNIT/ML IJ SOLN
10000.0000 [IU] | INTRAMUSCULAR | Status: DC
  Administered 2011-10-16: 10000 [IU] via SUBCUTANEOUS

## 2011-10-16 MED ORDER — EPOETIN ALFA 10000 UNIT/ML IJ SOLN
INTRAMUSCULAR | Status: AC
  Administered 2011-10-16: 10000 [IU] via SUBCUTANEOUS
  Filled 2011-10-16: qty 1

## 2011-10-23 ENCOUNTER — Encounter (HOSPITAL_COMMUNITY)
Admission: RE | Admit: 2011-10-23 | Discharge: 2011-10-23 | Disposition: A | Discharge status: Home / Self Care | Payer: PRIVATE HEALTH INSURANCE | Source: Ambulatory Visit | Attending: Nephrology | Admitting: Nephrology

## 2011-10-23 LAB — IRON AND TIBC
Saturation Ratios: 17 % — ABNORMAL LOW (ref 20–55)
UIBC: 192 ug/dL (ref 125–400)

## 2011-10-23 LAB — FERRITIN: Ferritin: 396 ng/mL — ABNORMAL HIGH (ref 10–291)

## 2011-10-23 LAB — POCT HEMOGLOBIN-HEMACUE: Hemoglobin: 10.1 g/dL — ABNORMAL LOW (ref 12.0–15.0)

## 2011-10-23 MED ORDER — CLONIDINE HCL 0.1 MG PO TABS: 0.1000 mg | ORAL_TABLET | ORAL | Status: DC | PRN

## 2011-10-23 MED ORDER — EPOETIN ALFA 10000 UNIT/ML IJ SOLN
10000.0000 [IU] | INTRAMUSCULAR | Status: DC
  Administered 2011-10-23: 10000 [IU] via SUBCUTANEOUS
  Filled 2011-10-23: qty 1

## 2011-10-29 ENCOUNTER — Encounter (HOSPITAL_COMMUNITY): Payer: PRIVATE HEALTH INSURANCE

## 2011-10-31 ENCOUNTER — Other Ambulatory Visit: Payer: Self-pay | Admitting: Internal Medicine

## 2011-11-03 ENCOUNTER — Other Ambulatory Visit (HOSPITAL_COMMUNITY): Payer: Self-pay | Admitting: *Deleted

## 2011-11-04 ENCOUNTER — Encounter (HOSPITAL_COMMUNITY)
Admission: RE | Admit: 2011-11-04 | Discharge: 2011-11-04 | Disposition: A | Discharge status: Home / Self Care | Payer: PRIVATE HEALTH INSURANCE | Source: Ambulatory Visit | Attending: Nephrology | Admitting: Nephrology

## 2011-11-04 DIAGNOSIS — D509 Iron deficiency anemia, unspecified: Secondary | ICD-10-CM | POA: Insufficient documentation

## 2011-11-04 LAB — POCT HEMOGLOBIN-HEMACUE: Hemoglobin: 10.5 g/dL — ABNORMAL LOW (ref 12.0–15.0)

## 2011-11-04 MED ORDER — CLONIDINE HCL 0.1 MG PO TABS: 0.1000 mg | ORAL_TABLET | ORAL | Status: DC | PRN

## 2011-11-04 MED ORDER — EPOETIN ALFA 10000 UNIT/ML IJ SOLN
10000.0000 [IU] | INTRAMUSCULAR | Status: DC
  Administered 2011-11-04: 10000 [IU] via SUBCUTANEOUS
  Filled 2011-11-04: qty 1

## 2011-11-05 ENCOUNTER — Other Ambulatory Visit: Payer: Self-pay | Admitting: Internal Medicine

## 2011-11-05 MED ORDER — ATENOLOL 25 MG PO TABS: 50.0000 mg | ORAL_TABLET | Freq: Two times a day (BID) | ORAL | Status: DC

## 2011-11-05 MED ORDER — INSULIN PEN NEEDLE 32G X 6 MM MISC: 1.0000 | Freq: Every evening | Status: DC

## 2011-11-09 ENCOUNTER — Other Ambulatory Visit: Payer: Self-pay | Admitting: Internal Medicine

## 2011-11-11 ENCOUNTER — Encounter (HOSPITAL_COMMUNITY)
Admission: RE | Admit: 2011-11-11 | Discharge: 2011-11-11 | Disposition: A | Discharge status: Home / Self Care | Payer: PRIVATE HEALTH INSURANCE | Source: Ambulatory Visit | Attending: Nephrology | Admitting: Nephrology

## 2011-11-11 LAB — POCT HEMOGLOBIN-HEMACUE: Hemoglobin: 10 g/dL — ABNORMAL LOW (ref 12.0–15.0)

## 2011-11-11 MED ORDER — SODIUM CHLORIDE 0.9 % IV SOLN
1020.0000 mg | Freq: Once | INTRAVENOUS | Status: AC
  Administered 2011-11-11: 1020 mg via INTRAVENOUS
  Filled 2011-11-11: qty 34

## 2011-11-11 MED ORDER — EPOETIN ALFA 10000 UNIT/ML IJ SOLN
10000.0000 [IU] | INTRAMUSCULAR | Status: DC
  Administered 2011-11-11: 10000 [IU] via SUBCUTANEOUS

## 2011-11-11 MED ORDER — EPOETIN ALFA 10000 UNIT/ML IJ SOLN
INTRAMUSCULAR | Status: AC
  Administered 2011-11-11: 10000 [IU] via SUBCUTANEOUS
  Filled 2011-11-11: qty 1

## 2011-11-18 ENCOUNTER — Encounter (HOSPITAL_COMMUNITY)
Admission: RE | Admit: 2011-11-18 | Discharge: 2011-11-18 | Disposition: A | Discharge status: Home / Self Care | Payer: PRIVATE HEALTH INSURANCE | Source: Ambulatory Visit | Attending: Nephrology | Admitting: Nephrology

## 2011-11-18 LAB — IRON AND TIBC
Iron: 60 ug/dL (ref 42–135)
TIBC: 201 ug/dL — ABNORMAL LOW (ref 250–470)

## 2011-11-18 LAB — FERRITIN: Ferritin: 853 ng/mL — ABNORMAL HIGH (ref 10–291)

## 2011-11-18 LAB — POCT HEMOGLOBIN-HEMACUE: Hemoglobin: 10.1 g/dL — ABNORMAL LOW (ref 12.0–15.0)

## 2011-11-18 MED ORDER — EPOETIN ALFA 10000 UNIT/ML IJ SOLN
10000.0000 [IU] | INTRAMUSCULAR | Status: DC
  Administered 2011-11-18: 10000 [IU] via SUBCUTANEOUS

## 2011-11-18 MED ORDER — EPOETIN ALFA 10000 UNIT/ML IJ SOLN
INTRAMUSCULAR | Status: AC
  Administered 2011-11-18: 10000 [IU] via SUBCUTANEOUS
  Filled 2011-11-18: qty 1

## 2011-11-25 ENCOUNTER — Encounter (HOSPITAL_COMMUNITY)
Admission: RE | Admit: 2011-11-25 | Discharge: 2011-11-25 | Disposition: A | Discharge status: Home / Self Care | Payer: PRIVATE HEALTH INSURANCE | Source: Ambulatory Visit | Attending: Nephrology | Admitting: Nephrology

## 2011-11-25 MED ORDER — CLONIDINE HCL 0.1 MG PO TABS: 0.1000 mg | ORAL_TABLET | ORAL | Status: DC | PRN

## 2011-11-25 MED ORDER — EPOETIN ALFA 10000 UNIT/ML IJ SOLN
10000.0000 [IU] | INTRAMUSCULAR | Status: DC
  Administered 2011-11-25: 10000 [IU] via SUBCUTANEOUS

## 2011-11-25 MED ORDER — EPOETIN ALFA 10000 UNIT/ML IJ SOLN
INTRAMUSCULAR | Status: AC
  Administered 2011-11-25: 10000 [IU] via SUBCUTANEOUS
  Filled 2011-11-25: qty 1

## 2011-12-02 ENCOUNTER — Encounter (HOSPITAL_COMMUNITY)
Admission: RE | Admit: 2011-12-02 | Discharge: 2011-12-02 | Disposition: A | Discharge status: Home / Self Care | Payer: PRIVATE HEALTH INSURANCE | Source: Ambulatory Visit | Attending: Nephrology | Admitting: Nephrology

## 2011-12-02 LAB — POCT HEMOGLOBIN-HEMACUE: Hemoglobin: 10.6 g/dL — ABNORMAL LOW (ref 12.0–15.0)

## 2011-12-02 MED ORDER — CLONIDINE HCL 0.1 MG PO TABS: 0.1000 mg | ORAL_TABLET | ORAL | Status: DC | PRN

## 2011-12-02 MED ORDER — EPOETIN ALFA 10000 UNIT/ML IJ SOLN
10000.0000 [IU] | INTRAMUSCULAR | Status: DC
  Administered 2011-12-02: 10000 [IU] via SUBCUTANEOUS

## 2011-12-02 MED ORDER — EPOETIN ALFA 10000 UNIT/ML IJ SOLN
INTRAMUSCULAR | Status: AC
  Filled 2011-12-02: qty 1

## 2011-12-09 ENCOUNTER — Encounter (HOSPITAL_COMMUNITY): Payer: PRIVATE HEALTH INSURANCE

## 2011-12-16 ENCOUNTER — Encounter (HOSPITAL_COMMUNITY)
Admission: RE | Admit: 2011-12-16 | Discharge: 2011-12-16 | Disposition: A | Discharge status: Home / Self Care | Payer: PRIVATE HEALTH INSURANCE | Source: Ambulatory Visit | Attending: Nephrology | Admitting: Nephrology

## 2011-12-16 DIAGNOSIS — D509 Iron deficiency anemia, unspecified: Secondary | ICD-10-CM | POA: Insufficient documentation

## 2011-12-16 LAB — IRON AND TIBC
Iron: 92 ug/dL (ref 42–135)
TIBC: 204 ug/dL — ABNORMAL LOW (ref 250–470)

## 2011-12-16 LAB — POCT HEMOGLOBIN-HEMACUE: Hemoglobin: 10.8 g/dL — ABNORMAL LOW (ref 12.0–15.0)

## 2011-12-16 MED ORDER — EPOETIN ALFA 10000 UNIT/ML IJ SOLN
10000.0000 [IU] | INTRAMUSCULAR | Status: DC
  Administered 2011-12-16: 10000 [IU] via SUBCUTANEOUS

## 2011-12-16 MED ORDER — EPOETIN ALFA 10000 UNIT/ML IJ SOLN
INTRAMUSCULAR | Status: AC
  Administered 2011-12-16: 10000 [IU] via SUBCUTANEOUS
  Filled 2011-12-16: qty 1

## 2011-12-23 ENCOUNTER — Encounter (HOSPITAL_COMMUNITY)
Admission: RE | Admit: 2011-12-23 | Discharge: 2011-12-23 | Disposition: A | Discharge status: Home / Self Care | Payer: PRIVATE HEALTH INSURANCE | Source: Ambulatory Visit | Attending: Nephrology | Admitting: Nephrology

## 2011-12-23 MED ORDER — CLONIDINE HCL 0.1 MG PO TABS: 0.1000 mg | ORAL_TABLET | ORAL | Status: DC | PRN

## 2011-12-23 MED ORDER — EPOETIN ALFA 10000 UNIT/ML IJ SOLN: 10000.0000 [IU] | INTRAMUSCULAR | Status: DC

## 2011-12-29 ENCOUNTER — Other Ambulatory Visit: Payer: Self-pay | Admitting: Internal Medicine

## 2012-01-03 ENCOUNTER — Other Ambulatory Visit: Payer: Self-pay | Admitting: Internal Medicine

## 2012-01-04 NOTE — Telephone Encounter (Signed)
Refill approved - nurse to call in. 

## 2012-01-05 NOTE — Telephone Encounter (Signed)
Called to pharm 

## 2012-01-06 ENCOUNTER — Encounter (HOSPITAL_COMMUNITY)
Admission: RE | Admit: 2012-01-06 | Discharge: 2012-01-06 | Disposition: A | Discharge status: Home / Self Care | Payer: PRIVATE HEALTH INSURANCE | Source: Ambulatory Visit | Attending: Nephrology | Admitting: Nephrology

## 2012-01-06 DIAGNOSIS — D509 Iron deficiency anemia, unspecified: Secondary | ICD-10-CM | POA: Insufficient documentation

## 2012-01-06 MED ORDER — CLONIDINE HCL 0.1 MG PO TABS: 0.1000 mg | ORAL_TABLET | ORAL | Status: DC | PRN

## 2012-01-06 MED ORDER — EPOETIN ALFA 10000 UNIT/ML IJ SOLN
10000.0000 [IU] | INTRAMUSCULAR | Status: DC
  Administered 2012-01-06: 10000 [IU] via SUBCUTANEOUS

## 2012-01-06 MED ORDER — EPOETIN ALFA 10000 UNIT/ML IJ SOLN
INTRAMUSCULAR | Status: AC
  Filled 2012-01-06: qty 1

## 2012-01-13 ENCOUNTER — Encounter (HOSPITAL_COMMUNITY)
Admission: RE | Admit: 2012-01-13 | Discharge: 2012-01-13 | Disposition: A | Discharge status: Home / Self Care | Payer: PRIVATE HEALTH INSURANCE | Source: Ambulatory Visit | Attending: Nephrology | Admitting: Nephrology

## 2012-01-13 LAB — POCT HEMOGLOBIN-HEMACUE: Hemoglobin: 10.1 g/dL — ABNORMAL LOW (ref 12.0–15.0)

## 2012-01-13 MED ORDER — EPOETIN ALFA 10000 UNIT/ML IJ SOLN
10000.0000 [IU] | INTRAMUSCULAR | Status: DC
  Administered 2012-01-13: 10000 [IU] via SUBCUTANEOUS

## 2012-01-13 MED ORDER — EPOETIN ALFA 10000 UNIT/ML IJ SOLN
INTRAMUSCULAR | Status: AC
  Administered 2012-01-13: 10000 [IU] via SUBCUTANEOUS
  Filled 2012-01-13: qty 1

## 2012-01-14 LAB — IRON AND TIBC: Iron: 57 ug/dL (ref 42–135)

## 2012-01-14 LAB — FERRITIN: Ferritin: 657 ng/mL — ABNORMAL HIGH (ref 10–291)

## 2012-01-20 ENCOUNTER — Encounter: Payer: Self-pay | Admitting: Internal Medicine

## 2012-01-20 ENCOUNTER — Ambulatory Visit (INDEPENDENT_AMBULATORY_CARE_PROVIDER_SITE_OTHER): Payer: PRIVATE HEALTH INSURANCE | Admitting: Internal Medicine

## 2012-01-20 VITALS — BP 126/70 | HR 66 | Temp 97.0°F | Wt 258.4 lb

## 2012-01-20 DIAGNOSIS — E119 Type 2 diabetes mellitus without complications: Secondary | ICD-10-CM

## 2012-01-20 DIAGNOSIS — R05 Cough: Secondary | ICD-10-CM

## 2012-01-20 DIAGNOSIS — E785 Hyperlipidemia, unspecified: Secondary | ICD-10-CM

## 2012-01-20 DIAGNOSIS — R059 Cough, unspecified: Secondary | ICD-10-CM

## 2012-01-20 DIAGNOSIS — I10 Essential (primary) hypertension: Secondary | ICD-10-CM

## 2012-01-20 DIAGNOSIS — Z79899 Other long term (current) drug therapy: Secondary | ICD-10-CM

## 2012-01-20 DIAGNOSIS — Z23 Encounter for immunization: Secondary | ICD-10-CM

## 2012-01-20 LAB — COMPLETE METABOLIC PANEL WITH GFR
Alkaline Phosphatase: 59 U/L (ref 39–117)
BUN: 32 mg/dL — ABNORMAL HIGH (ref 6–23)
CO2: 24 mEq/L (ref 19–32)
GFR, Est African American: 35 mL/min — ABNORMAL LOW
GFR, Est Non African American: 30 mL/min — ABNORMAL LOW
Glucose, Bld: 134 mg/dL — ABNORMAL HIGH (ref 70–99)
Sodium: 143 mEq/L (ref 135–145)
Total Bilirubin: 0.2 mg/dL — ABNORMAL LOW (ref 0.3–1.2)
Total Protein: 7.1 g/dL (ref 6.0–8.3)

## 2012-01-20 MED ORDER — DEXTROMETHORPHAN-GUAIFENESIN 10-100 MG/5ML PO SYRP: 5.0000 mL | ORAL_SOLUTION | Freq: Four times a day (QID) | ORAL | Status: DC | PRN

## 2012-01-20 MED ORDER — INSULIN GLARGINE 100 UNIT/ML ~~LOC~~ SOLN: 40.0000 [IU] | Freq: Every day | SUBCUTANEOUS | Status: DC

## 2012-01-20 MED ORDER — ZOSTER VACCINE LIVE 19400 UNT/0.65ML ~~LOC~~ SOLR: 0.6500 mL | Freq: Once | SUBCUTANEOUS | Status: DC

## 2012-01-20 NOTE — Assessment & Plan Note (Signed)
Assessment: Patient has a nonproductive cough which started about 2 weeks ago.  She has no other symptoms, and her cough followed what sounds like a viral URI.  She has a clear lung exam and no fever.  Plan: Treat symptomatically with Robitussin-DM sugar-free cough syrup.  I advised her to call or return if her cough persists or worsens.

## 2012-01-20 NOTE — Patient Instructions (Signed)
General Instructions: Increase Lantus insulin to a dose of 40 units once daily. Take Robitussin-DM sugar-free cough syrup 1-2 teaspoonfuls 4 times a day if needed for cough. Please call if your cough persists or worsens.   Treatment Goals:  Goals (1 Years of Data) as of 01/20/2012          As of Today 01/13/12 01/06/12 12/23/11 12/16/11     Blood Pressure    . Blood Pressure < 130/80  126/70 136/65 138/73 127/72 127/74     Result Component    . HEMOGLOBIN A1C < 7.0  7.7        . LDL CALC < 100            Progress Toward Treatment Goals:  Treatment Goal 01/20/2012  Hemoglobin A1C deteriorated  Blood pressure at goal    Self Care Goals & Plans:  Self Care Goal 01/20/2012  Manage my medications bring my medications to every visit; take my medicines as prescribed  Monitor my health keep track of my blood glucose; bring my glucose meter and log to each visit; check my feet daily  Eat healthy foods eat foods that are low in salt; eat smaller portions; drink diet soda or water instead of juice or soda  Be physically active take a walk every day    Home Blood Glucose Monitoring 01/20/2012  Check my blood sugar 2 times a day  When to check my blood sugar before breakfast; before dinner     Care Management & Community Referrals:  Referral 01/20/2012  Referrals made for care management support none needed  Referrals made to community resources other (see comments)

## 2012-01-20 NOTE — Assessment & Plan Note (Signed)
Lipids:    Component Value Date/Time   CHOL 118 09/16/2011 1211   TRIG 268* 09/16/2011 1211   HDL 33* 09/16/2011 1211   LDLCALC 31 09/16/2011 1211   VLDL 54* 09/16/2011 1211   CHOLHDL 3.6 09/16/2011 1211    Assessment: Patient's LDL is at goal on current regimen of simvastatin 20 mg daily.  She has no apparent side effects from the medication.  Plan: Continue simvastatin 20 mg daily.

## 2012-01-20 NOTE — Assessment & Plan Note (Signed)
BP Readings from Last 3 Encounters:  01/20/12 126/70  01/13/12 136/65  01/06/12 138/73    Lab Results  Component Value Date   NA 143 01/20/2012   K 4.1 01/20/2012   CREATININE 1.72* 01/20/2012    Assessment:  Blood pressure control: controlled  Progress toward BP goal:  at goal  Comments: Blood pressure is well controlled on amlodipine 10 mg daily, atenolol 50 mg twice a day, clonidine 0.3 mg twice a day, enalapril 20 mg twice a day, and furosemide 40 mg daily  Plan:  Medications:  continue current medications  Educational resources provided: brochure  Self management tools provided: home blood pressure logbook  Other plans: check a metabolic panel as above

## 2012-01-20 NOTE — Progress Notes (Signed)
  Subjective:    Patient ID: Joan Banks, female    DOB: Jul 30, 1945, 66 y.o.   MRN: [id number]  HPI Patient returns for followup of her diabetes mellitus, hypertension, hyperlipidemia, and other chronic medical problems.  She also complains of a nonproductive cough for the past 2 weeks, which started with cold symptoms and a scratchy throat.  She reports that her blood sugars have been running too high, so she self-increased her Lantus insulin dose from 24 units daily to a dose of 36 units daily about one week ago.  She reports some improvement in her blood sugars since the increase.  Her blood glucose meter record shows 81% of her values over the past month were above target range, with an average value of 183; over the past week her morning values have ranged from 108-149 and her afternoon/evening values have ranged from 185-291.  She reports that she has been compliant with her medications.  She reports that she uses her CPAP regularly.     Review of Systems  Constitutional: Negative for fever, chills and diaphoresis.  HENT: Negative for ear pain.   Respiratory: Positive for cough. Negative for shortness of breath.   Cardiovascular: Positive for leg swelling (Stable and chronic). Negative for chest pain.  Gastrointestinal: Negative for nausea, vomiting and abdominal pain.  Genitourinary: Positive for frequency. Negative for dysuria.  Musculoskeletal: Negative for myalgias and arthralgias.       Objective:   Physical Exam  Constitutional: No distress.  HENT:  Nose: Right sinus exhibits no maxillary sinus tenderness and no frontal sinus tenderness. Left sinus exhibits no maxillary sinus tenderness and no frontal sinus tenderness.  Mouth/Throat: No oropharyngeal exudate or posterior oropharyngeal erythema.  Cardiovascular: Normal rate and regular rhythm.  Exam reveals no gallop and no friction rub.   No murmur heard. Pulmonary/Chest: Effort normal and breath sounds normal. No  respiratory distress. She has no wheezes. She has no rales.  Abdominal: Soft. Bowel sounds are normal. She exhibits no distension. There is no tenderness. There is no guarding.       Assessment & Plan:

## 2012-01-20 NOTE — Assessment & Plan Note (Signed)
Lab Results  Component Value Date   HGBA1C 7.7 01/20/2012   HGBA1C 7.1 07/29/2011   HGBA1C 9.4 04/22/2011     Assessment:  Diabetes control: fair control  Progress toward A1C goal:  deteriorated  Comments: patient's blood sugars have been elevated on Lantus insulin 24 units daily, glipizide XL 20 mg daily, and Januvia 50 mg daily; she increased her Lantus last week to 36 units daily, with some improvement in her blood sugars but they are still running high.  Plan:  Medications:  increase Lantus insulin to a dose of 40 units daily; continue glipizide XL 20 mg daily and Januvia 50 mg daily  Home glucose monitoring:   Frequency: 2 times a day   Timing: before breakfast;before dinner  Instruction/counseling given: discussed diet  Educational resources provided: brochure  Self management tools provided: copy of home glucose meter download;home glucose logbook  Other plans: check a comprehensive metabolic panel

## 2012-01-21 ENCOUNTER — Encounter (HOSPITAL_COMMUNITY)
Admission: RE | Admit: 2012-01-21 | Discharge: 2012-01-21 | Disposition: A | Discharge status: Home / Self Care | Payer: PRIVATE HEALTH INSURANCE | Source: Ambulatory Visit | Attending: Nephrology | Admitting: Nephrology

## 2012-01-21 LAB — POCT HEMOGLOBIN-HEMACUE: Hemoglobin: 10.7 g/dL — ABNORMAL LOW (ref 12.0–15.0)

## 2012-01-21 MED ORDER — EPOETIN ALFA 10000 UNIT/ML IJ SOLN
INTRAMUSCULAR | Status: DC
Start: 2012-01-21 — End: 2012-01-22
  Filled 2012-01-21: qty 1

## 2012-01-21 MED ORDER — CLONIDINE HCL 0.1 MG PO TABS: 0.1000 mg | ORAL_TABLET | ORAL | Status: DC | PRN

## 2012-01-21 MED ORDER — EPOETIN ALFA 10000 UNIT/ML IJ SOLN
10000.0000 [IU] | INTRAMUSCULAR | Status: DC
  Administered 2012-01-21: 10000 [IU] via SUBCUTANEOUS

## 2012-01-26 ENCOUNTER — Other Ambulatory Visit: Payer: Self-pay | Admitting: Internal Medicine

## 2012-01-27 ENCOUNTER — Other Ambulatory Visit: Payer: Self-pay | Admitting: Internal Medicine

## 2012-01-29 ENCOUNTER — Encounter: Payer: Self-pay | Admitting: Internal Medicine

## 2012-01-29 ENCOUNTER — Encounter (HOSPITAL_COMMUNITY)
Admission: RE | Admit: 2012-01-29 | Discharge: 2012-01-29 | Disposition: A | Discharge status: Home / Self Care | Payer: PRIVATE HEALTH INSURANCE | Source: Ambulatory Visit | Attending: Nephrology | Admitting: Nephrology

## 2012-01-29 MED ORDER — EPOETIN ALFA 10000 UNIT/ML IJ SOLN
10000.0000 [IU] | INTRAMUSCULAR | Status: DC
  Administered 2012-01-29: 10000 [IU] via SUBCUTANEOUS

## 2012-01-29 MED ORDER — CLONIDINE HCL 0.1 MG PO TABS: 0.1000 mg | ORAL_TABLET | ORAL | Status: DC | PRN

## 2012-01-29 MED ORDER — EPOETIN ALFA 10000 UNIT/ML IJ SOLN
INTRAMUSCULAR | Status: AC
  Administered 2012-01-29: 10000 [IU] via SUBCUTANEOUS
  Filled 2012-01-29: qty 1

## 2012-02-05 ENCOUNTER — Encounter (HOSPITAL_COMMUNITY)
Admission: RE | Admit: 2012-02-05 | Discharge: 2012-02-05 | Disposition: A | Discharge status: Home / Self Care | Payer: PRIVATE HEALTH INSURANCE | Source: Ambulatory Visit | Attending: Nephrology | Admitting: Nephrology

## 2012-02-05 DIAGNOSIS — D509 Iron deficiency anemia, unspecified: Secondary | ICD-10-CM | POA: Insufficient documentation

## 2012-02-05 LAB — POCT HEMOGLOBIN-HEMACUE: Hemoglobin: 10.2 g/dL — ABNORMAL LOW (ref 12.0–15.0)

## 2012-02-05 MED ORDER — EPOETIN ALFA 10000 UNIT/ML IJ SOLN
10000.0000 [IU] | INTRAMUSCULAR | Status: DC
  Administered 2012-02-05: 10000 [IU] via SUBCUTANEOUS

## 2012-02-05 MED ORDER — CLONIDINE HCL 0.1 MG PO TABS: 0.1000 mg | ORAL_TABLET | ORAL | Status: DC | PRN

## 2012-02-05 MED ORDER — EPOETIN ALFA 10000 UNIT/ML IJ SOLN
INTRAMUSCULAR | Status: AC
  Filled 2012-02-05: qty 1

## 2012-02-12 ENCOUNTER — Encounter (HOSPITAL_COMMUNITY)
Admission: RE | Admit: 2012-02-12 | Discharge: 2012-02-12 | Disposition: A | Discharge status: Home / Self Care | Payer: PRIVATE HEALTH INSURANCE | Source: Ambulatory Visit | Attending: Nephrology | Admitting: Nephrology

## 2012-02-12 MED ORDER — EPOETIN ALFA 10000 UNIT/ML IJ SOLN
INTRAMUSCULAR | Status: AC
  Filled 2012-02-12: qty 1

## 2012-02-12 MED ORDER — EPOETIN ALFA 10000 UNIT/ML IJ SOLN
10000.0000 [IU] | INTRAMUSCULAR | Status: DC
  Administered 2012-02-12: 10000 [IU] via SUBCUTANEOUS

## 2012-02-12 MED ORDER — CLONIDINE HCL 0.1 MG PO TABS: 0.1000 mg | ORAL_TABLET | ORAL | Status: DC | PRN

## 2012-02-17 ENCOUNTER — Encounter (HOSPITAL_COMMUNITY)
Admission: RE | Admit: 2012-02-17 | Discharge: 2012-02-17 | Disposition: A | Discharge status: Home / Self Care | Payer: PRIVATE HEALTH INSURANCE | Source: Ambulatory Visit | Attending: Nephrology | Admitting: Nephrology

## 2012-02-17 LAB — POCT HEMOGLOBIN-HEMACUE: Hemoglobin: 10.6 g/dL — ABNORMAL LOW (ref 12.0–15.0)

## 2012-02-17 MED ORDER — CLONIDINE HCL 0.1 MG PO TABS: 0.1000 mg | ORAL_TABLET | ORAL | Status: DC | PRN

## 2012-02-17 MED ORDER — EPOETIN ALFA 10000 UNIT/ML IJ SOLN
10000.0000 [IU] | INTRAMUSCULAR | Status: DC
  Administered 2012-02-17: 10000 [IU] via SUBCUTANEOUS

## 2012-02-17 MED ORDER — EPOETIN ALFA 10000 UNIT/ML IJ SOLN
INTRAMUSCULAR | Status: AC
  Filled 2012-02-17: qty 1

## 2012-02-24 ENCOUNTER — Encounter (HOSPITAL_COMMUNITY): Payer: PRIVATE HEALTH INSURANCE

## 2012-03-02 ENCOUNTER — Inpatient Hospital Stay (HOSPITAL_COMMUNITY): Admission: RE | Admit: 2012-03-02 | Payer: PRIVATE HEALTH INSURANCE | Source: Ambulatory Visit

## 2012-03-07 ENCOUNTER — Other Ambulatory Visit (HOSPITAL_COMMUNITY): Payer: Self-pay | Admitting: *Deleted

## 2012-03-08 ENCOUNTER — Encounter (HOSPITAL_COMMUNITY)
Admission: RE | Admit: 2012-03-08 | Discharge: 2012-03-08 | Disposition: A | Discharge status: Home / Self Care | Payer: PRIVATE HEALTH INSURANCE | Source: Ambulatory Visit | Attending: Nephrology | Admitting: Nephrology

## 2012-03-08 DIAGNOSIS — N183 Chronic kidney disease, stage 3 unspecified: Secondary | ICD-10-CM | POA: Insufficient documentation

## 2012-03-08 DIAGNOSIS — D638 Anemia in other chronic diseases classified elsewhere: Secondary | ICD-10-CM | POA: Insufficient documentation

## 2012-03-08 DIAGNOSIS — R5381 Other malaise: Secondary | ICD-10-CM | POA: Insufficient documentation

## 2012-03-08 LAB — POCT HEMOGLOBIN-HEMACUE: Hemoglobin: 10.5 g/dL — ABNORMAL LOW (ref 12.0–15.0)

## 2012-03-08 MED ORDER — EPOETIN ALFA 10000 UNIT/ML IJ SOLN
10000.0000 [IU] | INTRAMUSCULAR | Status: DC
  Administered 2012-03-08: 10000 [IU] via SUBCUTANEOUS

## 2012-03-08 MED ORDER — EPOETIN ALFA 10000 UNIT/ML IJ SOLN
INTRAMUSCULAR | Status: AC
  Filled 2012-03-08: qty 1

## 2012-03-09 LAB — IRON AND TIBC
Iron: 62 ug/dL (ref 42–135)
Saturation Ratios: 27 % (ref 20–55)
TIBC: 230 ug/dL — ABNORMAL LOW (ref 250–470)
UIBC: 168 ug/dL (ref 125–400)

## 2012-03-15 ENCOUNTER — Encounter (HOSPITAL_COMMUNITY)
Admission: RE | Admit: 2012-03-15 | Discharge: 2012-03-15 | Disposition: A | Discharge status: Home / Self Care | Payer: PRIVATE HEALTH INSURANCE | Source: Ambulatory Visit | Attending: Nephrology | Admitting: Nephrology

## 2012-03-15 LAB — POCT HEMOGLOBIN-HEMACUE: Hemoglobin: 10.3 g/dL — ABNORMAL LOW (ref 12.0–15.0)

## 2012-03-15 MED ORDER — EPOETIN ALFA 10000 UNIT/ML IJ SOLN
10000.0000 [IU] | INTRAMUSCULAR | Status: DC
  Administered 2012-03-15: 10000 [IU] via SUBCUTANEOUS

## 2012-03-15 MED ORDER — EPOETIN ALFA 10000 UNIT/ML IJ SOLN
INTRAMUSCULAR | Status: AC
  Filled 2012-03-15: qty 1

## 2012-03-23 ENCOUNTER — Encounter (HOSPITAL_COMMUNITY)
Admission: RE | Admit: 2012-03-23 | Discharge: 2012-03-23 | Disposition: A | Discharge status: Home / Self Care | Payer: PRIVATE HEALTH INSURANCE | Source: Ambulatory Visit | Attending: Nephrology | Admitting: Nephrology

## 2012-03-23 LAB — POCT HEMOGLOBIN-HEMACUE: Hemoglobin: 9.9 g/dL — ABNORMAL LOW (ref 12.0–15.0)

## 2012-03-23 MED ORDER — EPOETIN ALFA 10000 UNIT/ML IJ SOLN
10000.0000 [IU] | INTRAMUSCULAR | Status: DC
  Administered 2012-03-23: 10000 [IU] via SUBCUTANEOUS

## 2012-03-23 MED ORDER — EPOETIN ALFA 10000 UNIT/ML IJ SOLN
INTRAMUSCULAR | Status: AC
  Administered 2012-03-23: 10000 [IU] via SUBCUTANEOUS
  Filled 2012-03-23: qty 1

## 2012-03-29 ENCOUNTER — Encounter (HOSPITAL_COMMUNITY): Payer: PRIVATE HEALTH INSURANCE

## 2012-03-30 ENCOUNTER — Other Ambulatory Visit (HOSPITAL_COMMUNITY): Payer: Self-pay | Admitting: *Deleted

## 2012-03-30 ENCOUNTER — Other Ambulatory Visit: Payer: Self-pay | Admitting: Internal Medicine

## 2012-03-31 ENCOUNTER — Encounter (HOSPITAL_COMMUNITY)
Admission: RE | Admit: 2012-03-31 | Discharge: 2012-03-31 | Disposition: A | Discharge status: Home / Self Care | Payer: PRIVATE HEALTH INSURANCE | Source: Ambulatory Visit | Attending: Nephrology | Admitting: Nephrology

## 2012-03-31 LAB — RENAL FUNCTION PANEL
BUN: 20 mg/dL (ref 6–23)
CO2: 26 mEq/L (ref 19–32)
Calcium: 10.7 mg/dL — ABNORMAL HIGH (ref 8.4–10.5)
Chloride: 103 mEq/L (ref 96–112)
Creatinine, Ser: 1.55 mg/dL — ABNORMAL HIGH (ref 0.50–1.10)
GFR calc non Af Amer: 33 mL/min — ABNORMAL LOW (ref >90)
Glucose, Bld: 287 mg/dL — ABNORMAL HIGH (ref 70–99)

## 2012-03-31 LAB — HEMOGLOBIN AND HEMATOCRIT, BLOOD
HCT: 33.2 % — ABNORMAL LOW (ref 36.0–46.0)
Hemoglobin: 10.4 g/dL — ABNORMAL LOW (ref 12.0–15.0)

## 2012-03-31 LAB — PROTEIN / CREATININE RATIO, URINE: Protein Creatinine Ratio: 0.53 — ABNORMAL HIGH (ref 0.00–0.15)

## 2012-03-31 MED ORDER — EPOETIN ALFA 10000 UNIT/ML IJ SOLN
INTRAMUSCULAR | Status: AC
  Administered 2012-03-31: 10000 [IU] via SUBCUTANEOUS
  Filled 2012-03-31: qty 1

## 2012-03-31 MED ORDER — EPOETIN ALFA 10000 UNIT/ML IJ SOLN
10000.0000 [IU] | INTRAMUSCULAR | Status: DC
  Administered 2012-03-31: 10000 [IU] via SUBCUTANEOUS

## 2012-03-31 MED ORDER — CLONIDINE HCL 0.1 MG PO TABS: 0.1000 mg | ORAL_TABLET | ORAL | Status: DC | PRN

## 2012-04-01 LAB — VITAMIN D 25 HYDROXY (VIT D DEFICIENCY, FRACTURES): Vit D, 25-Hydroxy: 31 ng/mL (ref 30–89)

## 2012-04-01 LAB — PTH, INTACT AND CALCIUM
Calcium, Total (PTH): 10.4 mg/dL (ref 8.4–10.5)
PTH: 162.1 pg/mL — ABNORMAL HIGH (ref 14.0–72.0)

## 2012-04-07 ENCOUNTER — Encounter (HOSPITAL_COMMUNITY)
Admission: RE | Admit: 2012-04-07 | Discharge: 2012-04-07 | Disposition: A | Discharge status: Home / Self Care | Payer: PRIVATE HEALTH INSURANCE | Source: Ambulatory Visit | Attending: Nephrology | Admitting: Nephrology

## 2012-04-07 DIAGNOSIS — N183 Chronic kidney disease, stage 3 unspecified: Secondary | ICD-10-CM | POA: Insufficient documentation

## 2012-04-07 DIAGNOSIS — D638 Anemia in other chronic diseases classified elsewhere: Secondary | ICD-10-CM | POA: Insufficient documentation

## 2012-04-07 LAB — IRON AND TIBC
Iron: 71 ug/dL (ref 42–135)
TIBC: 250 ug/dL (ref 250–470)

## 2012-04-07 MED ORDER — EPOETIN ALFA 10000 UNIT/ML IJ SOLN: 10000.0000 [IU] | INTRAMUSCULAR | Status: DC

## 2012-04-20 ENCOUNTER — Telehealth: Payer: Self-pay | Admitting: Dietician

## 2012-04-20 MED ORDER — GLUCOSE BLOOD VI STRP: ORAL_STRIP | Status: DC

## 2012-04-20 NOTE — Telephone Encounter (Signed)
Pt requests rx for accu chek compact plus drums for 1x/day testing be sent to [facility] on [address].

## 2012-04-22 ENCOUNTER — Encounter (HOSPITAL_COMMUNITY)
Admission: RE | Admit: 2012-04-22 | Discharge: 2012-04-22 | Disposition: A | Discharge status: Home / Self Care | Payer: PRIVATE HEALTH INSURANCE | Source: Ambulatory Visit | Attending: Nephrology | Admitting: Nephrology

## 2012-04-22 MED ORDER — EPOETIN ALFA 10000 UNIT/ML IJ SOLN
INTRAMUSCULAR | Status: AC
  Filled 2012-04-22: qty 1

## 2012-04-22 MED ORDER — CLONIDINE HCL 0.1 MG PO TABS: 0.1000 mg | ORAL_TABLET | ORAL | Status: DC | PRN

## 2012-04-22 MED ORDER — EPOETIN ALFA 10000 UNIT/ML IJ SOLN
10000.0000 [IU] | INTRAMUSCULAR | Status: DC
  Administered 2012-04-22: 10000 [IU] via SUBCUTANEOUS

## 2012-04-25 ENCOUNTER — Telehealth: Payer: Self-pay | Admitting: Dietician

## 2012-04-25 NOTE — Telephone Encounter (Signed)
getting E-9 on meter. Told her this is low/dead battery error message and gave her option of calling accu chek toll free phone number or buying one at local food or drug store.

## 2012-04-27 ENCOUNTER — Encounter (HOSPITAL_COMMUNITY)
Admission: RE | Admit: 2012-04-27 | Discharge: 2012-04-27 | Disposition: A | Discharge status: Home / Self Care | Payer: PRIVATE HEALTH INSURANCE | Source: Ambulatory Visit | Attending: Nephrology | Admitting: Nephrology

## 2012-04-27 MED ORDER — CLONIDINE HCL 0.1 MG PO TABS: 0.1000 mg | ORAL_TABLET | ORAL | Status: DC | PRN

## 2012-04-27 MED ORDER — EPOETIN ALFA 10000 UNIT/ML IJ SOLN: 10000.0000 [IU] | INTRAMUSCULAR | Status: DC

## 2012-05-03 ENCOUNTER — Other Ambulatory Visit: Payer: Self-pay | Admitting: *Deleted

## 2012-05-03 DIAGNOSIS — E1159 Type 2 diabetes mellitus with other circulatory complications: Secondary | ICD-10-CM

## 2012-05-03 MED ORDER — ACCU-CHEK COMPACT PLUS CARE KIT: PACK | Status: DC

## 2012-05-12 ENCOUNTER — Encounter (HOSPITAL_COMMUNITY)
Admission: RE | Admit: 2012-05-12 | Discharge: 2012-05-12 | Disposition: A | Discharge status: Home / Self Care | Payer: PRIVATE HEALTH INSURANCE | Source: Ambulatory Visit | Attending: Nephrology | Admitting: Nephrology

## 2012-05-12 DIAGNOSIS — D638 Anemia in other chronic diseases classified elsewhere: Secondary | ICD-10-CM | POA: Insufficient documentation

## 2012-05-12 DIAGNOSIS — N183 Chronic kidney disease, stage 3 unspecified: Secondary | ICD-10-CM | POA: Insufficient documentation

## 2012-05-12 LAB — IRON AND TIBC
Iron: 60 ug/dL (ref 42–135)
Saturation Ratios: 32 % (ref 20–55)
UIBC: 129 ug/dL (ref 125–400)

## 2012-05-12 MED ORDER — CLONIDINE HCL 0.1 MG PO TABS: 0.1000 mg | ORAL_TABLET | ORAL | Status: DC | PRN

## 2012-05-12 MED ORDER — EPOETIN ALFA 10000 UNIT/ML IJ SOLN: 10000.0000 [IU] | INTRAMUSCULAR | Status: DC

## 2012-05-12 MED ORDER — EPOETIN ALFA 10000 UNIT/ML IJ SOLN
INTRAMUSCULAR | Status: AC
  Administered 2012-05-12: 10000 [IU] via SUBCUTANEOUS
  Filled 2012-05-12: qty 1

## 2012-05-19 ENCOUNTER — Other Ambulatory Visit (HOSPITAL_COMMUNITY): Payer: Self-pay | Admitting: *Deleted

## 2012-05-20 ENCOUNTER — Encounter (HOSPITAL_COMMUNITY)
Admission: RE | Admit: 2012-05-20 | Discharge: 2012-05-20 | Disposition: A | Discharge status: Home / Self Care | Payer: PRIVATE HEALTH INSURANCE | Source: Ambulatory Visit | Attending: Nephrology | Admitting: Nephrology

## 2012-05-20 MED ORDER — EPOETIN ALFA 10000 UNIT/ML IJ SOLN: 10000.0000 [IU] | INTRAMUSCULAR | Status: DC

## 2012-05-20 MED ORDER — CLONIDINE HCL 0.1 MG PO TABS: 0.1000 mg | ORAL_TABLET | ORAL | Status: DC | PRN

## 2012-05-20 MED ORDER — EPOETIN ALFA 10000 UNIT/ML IJ SOLN
INTRAMUSCULAR | Status: AC
  Administered 2012-05-20: 10000 [IU] via SUBCUTANEOUS
  Filled 2012-05-20: qty 1

## 2012-05-25 ENCOUNTER — Other Ambulatory Visit (HOSPITAL_COMMUNITY): Payer: Self-pay | Admitting: *Deleted

## 2012-05-26 ENCOUNTER — Encounter (HOSPITAL_COMMUNITY)
Admission: RE | Admit: 2012-05-26 | Discharge: 2012-05-26 | Disposition: A | Discharge status: Home / Self Care | Payer: PRIVATE HEALTH INSURANCE | Source: Ambulatory Visit | Attending: Nephrology | Admitting: Nephrology

## 2012-05-26 MED ORDER — CLONIDINE HCL 0.1 MG PO TABS: 0.1000 mg | ORAL_TABLET | ORAL | Status: DC | PRN

## 2012-05-26 MED ORDER — EPOETIN ALFA 20000 UNIT/ML IJ SOLN
INTRAMUSCULAR | Status: AC
  Administered 2012-05-26: 20000 [IU] via SUBCUTANEOUS
  Filled 2012-05-26: qty 1

## 2012-05-26 MED ORDER — EPOETIN ALFA 20000 UNIT/ML IJ SOLN: 20000.0000 [IU] | INTRAMUSCULAR | Status: DC

## 2012-05-28 ENCOUNTER — Other Ambulatory Visit: Payer: Self-pay | Admitting: Internal Medicine

## 2012-06-02 ENCOUNTER — Encounter (HOSPITAL_COMMUNITY): Payer: PRIVATE HEALTH INSURANCE

## 2012-06-02 ENCOUNTER — Other Ambulatory Visit: Payer: Self-pay | Admitting: Internal Medicine

## 2012-06-02 ENCOUNTER — Encounter (HOSPITAL_COMMUNITY)
Admission: RE | Admit: 2012-06-02 | Discharge: 2012-06-02 | Disposition: A | Discharge status: Home / Self Care | Payer: PRIVATE HEALTH INSURANCE | Source: Ambulatory Visit | Attending: Nephrology | Admitting: Nephrology

## 2012-06-02 DIAGNOSIS — D509 Iron deficiency anemia, unspecified: Secondary | ICD-10-CM | POA: Insufficient documentation

## 2012-06-02 LAB — POCT HEMOGLOBIN-HEMACUE: Hemoglobin: 10.8 g/dL — ABNORMAL LOW (ref 12.0–15.0)

## 2012-06-02 MED ORDER — EPOETIN ALFA 10000 UNIT/ML IJ SOLN
INTRAMUSCULAR | Status: AC
  Filled 2012-06-02: qty 1

## 2012-06-02 MED ORDER — EPOETIN ALFA 20000 UNIT/ML IJ SOLN
INTRAMUSCULAR | Status: AC
  Filled 2012-06-02: qty 1

## 2012-06-02 MED ORDER — EPOETIN ALFA 20000 UNIT/ML IJ SOLN
20000.0000 [IU] | INTRAMUSCULAR | Status: DC
  Administered 2012-06-02: 20000 [IU] via SUBCUTANEOUS

## 2012-06-06 ENCOUNTER — Other Ambulatory Visit: Payer: Self-pay | Admitting: Internal Medicine

## 2012-06-06 DIAGNOSIS — I10 Essential (primary) hypertension: Secondary | ICD-10-CM

## 2012-06-08 ENCOUNTER — Encounter (HOSPITAL_COMMUNITY)
Admission: RE | Admit: 2012-06-08 | Discharge: 2012-06-08 | Disposition: A | Discharge status: Home / Self Care | Payer: PRIVATE HEALTH INSURANCE | Source: Ambulatory Visit | Attending: Nephrology | Admitting: Nephrology

## 2012-06-08 LAB — IRON AND TIBC
Saturation Ratios: 25 % (ref 20–55)
TIBC: 215 ug/dL — ABNORMAL LOW (ref 250–470)

## 2012-06-08 LAB — POCT HEMOGLOBIN-HEMACUE: Hemoglobin: 10.2 g/dL — ABNORMAL LOW (ref 12.0–15.0)

## 2012-06-08 MED ORDER — CLONIDINE HCL 0.1 MG PO TABS: 0.1000 mg | ORAL_TABLET | ORAL | Status: DC | PRN

## 2012-06-08 MED ORDER — EPOETIN ALFA 20000 UNIT/ML IJ SOLN
20000.0000 [IU] | INTRAMUSCULAR | Status: DC
  Administered 2012-06-08: 20000 [IU] via SUBCUTANEOUS

## 2012-06-08 MED ORDER — EPOETIN ALFA 20000 UNIT/ML IJ SOLN
INTRAMUSCULAR | Status: AC
  Filled 2012-06-08: qty 1

## 2012-06-15 ENCOUNTER — Encounter (HOSPITAL_COMMUNITY)
Admission: RE | Admit: 2012-06-15 | Discharge: 2012-06-15 | Disposition: A | Discharge status: Home / Self Care | Payer: PRIVATE HEALTH INSURANCE | Source: Ambulatory Visit | Attending: Nephrology | Admitting: Nephrology

## 2012-06-15 LAB — POCT HEMOGLOBIN-HEMACUE: Hemoglobin: 11.1 g/dL — ABNORMAL LOW (ref 12.0–15.0)

## 2012-06-15 MED ORDER — EPOETIN ALFA 20000 UNIT/ML IJ SOLN
INTRAMUSCULAR | Status: AC
  Filled 2012-06-15: qty 1

## 2012-06-15 MED ORDER — EPOETIN ALFA 20000 UNIT/ML IJ SOLN
20000.0000 [IU] | INTRAMUSCULAR | Status: DC
  Administered 2012-06-15: 20000 [IU] via SUBCUTANEOUS

## 2012-06-15 MED ORDER — CLONIDINE HCL 0.1 MG PO TABS: 0.1000 mg | ORAL_TABLET | ORAL | Status: DC | PRN

## 2012-06-16 ENCOUNTER — Encounter (HOSPITAL_COMMUNITY): Payer: PRIVATE HEALTH INSURANCE

## 2012-06-22 ENCOUNTER — Encounter (HOSPITAL_COMMUNITY): Payer: PRIVATE HEALTH INSURANCE

## 2012-06-22 ENCOUNTER — Encounter: Payer: Self-pay | Admitting: Internal Medicine

## 2012-06-22 ENCOUNTER — Ambulatory Visit (INDEPENDENT_AMBULATORY_CARE_PROVIDER_SITE_OTHER): Payer: PRIVATE HEALTH INSURANCE | Admitting: Internal Medicine

## 2012-06-22 VITALS — BP 145/77 | HR 93 | Temp 97.5°F | Ht 63.0 in | Wt 264.9 lb

## 2012-06-22 DIAGNOSIS — E119 Type 2 diabetes mellitus without complications: Secondary | ICD-10-CM

## 2012-06-22 DIAGNOSIS — E785 Hyperlipidemia, unspecified: Secondary | ICD-10-CM

## 2012-06-22 DIAGNOSIS — I10 Essential (primary) hypertension: Secondary | ICD-10-CM

## 2012-06-22 DIAGNOSIS — R0602 Shortness of breath: Secondary | ICD-10-CM

## 2012-06-22 DIAGNOSIS — N189 Chronic kidney disease, unspecified: Secondary | ICD-10-CM

## 2012-06-22 LAB — BASIC METABOLIC PANEL WITH GFR
CO2: 26 mEq/L (ref 19–32)
Calcium: 10.4 mg/dL (ref 8.4–10.5)
Creat: 1.76 mg/dL — ABNORMAL HIGH (ref 0.50–1.10)
GFR, Est Non African American: 29 mL/min — ABNORMAL LOW
Sodium: 141 mEq/L (ref 135–145)

## 2012-06-22 LAB — PRO B NATRIURETIC PEPTIDE: Pro B Natriuretic peptide (BNP): 144 pg/mL — ABNORMAL HIGH (ref <126)

## 2012-06-22 LAB — GLUCOSE, CAPILLARY: Glucose-Capillary: 184 mg/dL — ABNORMAL HIGH (ref 70–99)

## 2012-06-22 MED ORDER — ACETAMINOPHEN-CODEINE #3 300-30 MG PO TABS: ORAL_TABLET | ORAL | Status: DC

## 2012-06-22 MED ORDER — CETIRIZINE HCL 10 MG PO TABS: 10.0000 mg | ORAL_TABLET | Freq: Every day | ORAL | Status: DC

## 2012-06-22 NOTE — Assessment & Plan Note (Addendum)
SpO2 Readings from Last 3 Encounters:  06/22/12 93%  06/08/12 100%  05/26/12 97%    Assessment: Patient reports mild exertional dyspnea, which is currently not significantly limiting her activity.  Her symptoms may relate to her underlying COPD.  Her lung exam today is unremarkable.  Plan: Will get a chest x-ray since it has been some time since her last chest x-ray was done.  Continue current inhaler regimen.

## 2012-06-22 NOTE — Progress Notes (Signed)
  Subjective:    Patient ID: Joan Banks, female    DOB: 05-13-1945, 67 y.o.   MRN: [id number]  HPI Patient presents for follow-up of her diabetes mellitus, hypertension, hyperlipidemia, and other chronic medical problems.  She also reports some recent mild exertional dyspnea, although she says that she is able to walk reasonably long distances using her rolling walker without significant problems.  She has no other complaints today, and reports that she has been doing well.  She reports that she has been compliant with her medications.  She uses her CPAP regularly at night.  Her blood sugars have been much better controlled since her last clinic visit, with 59% of values within target range; her blood sugars have ranged over the past month from a low of 63 to a high of 289, with an average of 133.  She reports that although we had increased her Lantus insulin to a dose of 40 units at the time of her last visit, that she actually has been taking 45 units regularly for a least a month.   Review of Systems  Respiratory: Positive for shortness of breath (Occasionall exertional dyspnea.).   Cardiovascular: Positive for leg swelling (Chronic). Negative for chest pain.  Gastrointestinal: Negative for nausea, vomiting and abdominal pain.  Genitourinary: Negative for dysuria and frequency.       Objective:   Physical Exam  Constitutional: No distress.  Cardiovascular: Normal rate, regular rhythm and normal heart sounds.  Exam reveals no gallop and no friction rub.   No murmur heard. Pulmonary/Chest: Effort normal and breath sounds normal. No respiratory distress. She has no wheezes. She has no rales.  Abdominal: Soft. Bowel sounds are normal. She exhibits no distension. There is no tenderness. There is no rebound and no guarding.  Musculoskeletal: She exhibits no edema.       Assessment & Plan:

## 2012-06-22 NOTE — Assessment & Plan Note (Signed)
BP Readings from Last 3 Encounters:  06/22/12 145/77  06/15/12 147/66  06/08/12 119/68    Lab Results  Component Value Date   NA 141 06/22/2012   K 4.3 06/22/2012   CREATININE 1.76* 06/22/2012    Assessment: Blood pressure control: mildly elevated Progress toward BP goal:  improved Comments: Patient is doing well on amlodipine 10 mg daily, atenolol 50 mg twice a day, clonidine 0.3 mg twice a day, enalapril 20 mg twice a day, and furosemide 40 mg daily  Plan: Medications:  continue current medications Educational resources provided: brochure Self management tools provided: home blood pressure logbook

## 2012-06-22 NOTE — Patient Instructions (Addendum)
General Instructions: Continue current medications. Please return for chest x-ray this week.   Treatment Goals:  Goals (1 Years of Data) as of 06/22/12         As of Today 06/15/12 06/08/12 06/02/12 05/26/12     Blood Pressure    . Blood Pressure < 130/80  145/77 147/66 119/68 149/77 143/64     Result Component    . HEMOGLOBIN A1C < 7.0  6.3        . LDL CALC < 100            Progress Toward Treatment Goals:  Treatment Goal 06/22/2012  Hemoglobin A1C at goal  Blood pressure improved    Self Care Goals & Plans:  Self Care Goal 06/22/2012  Manage my medications take my medicines as prescribed; bring my medications to every visit  Monitor my health keep track of my blood glucose; bring my glucose meter and log to each visit; check my feet daily  Eat healthy foods eat foods that are low in salt; drink diet soda or water instead of juice or soda; eat smaller portions  Be physically active take a walk every day    Home Blood Glucose Monitoring 06/22/2012  Check my blood sugar 2 times a day  When to check my blood sugar before breakfast; before dinner     Care Management & Community Referrals:  Referral 06/22/2012  Referrals made for care management support none needed  Referrals made to community resources none

## 2012-06-22 NOTE — Assessment & Plan Note (Signed)
Lab Results  Component Value Date   CREATININE 1.76* 06/22/2012   CREATININE 1.55* 03/31/2012   CREATININE 1.72* 01/20/2012   CREATININE 1.57* 09/24/2011   CREATININE 1.61* 09/16/2011   CREATININE 1.97* 06/22/2011     Assessment: Patient is followed by nephrologist Dr. Allena Katz.  Her creatinine is stable.  Plan: Patient will followup with Dr. Allena Katz.

## 2012-06-22 NOTE — Assessment & Plan Note (Signed)
Lipids:    Component Value Date/Time   CHOL 118 09/16/2011 1211   TRIG 268* 09/16/2011 1211   HDL 33* 09/16/2011 1211   LDLCALC 31 09/16/2011 1211   VLDL 54* 09/16/2011 1211   CHOLHDL 3.6 09/16/2011 1211    Assessment: Patient is doing well on on current regimen of simvastatin 20 mg daily.  Plan: Continue simvastatin 20 mg daily.

## 2012-06-22 NOTE — Assessment & Plan Note (Signed)
Lab Results  Component Value Date   HGBA1C 6.3 06/22/2012   HGBA1C 7.7 01/20/2012   HGBA1C 7.1 07/29/2011     Assessment: Diabetes control: good control (HgbA1C at goal) Progress toward A1C goal:  at goal Comments: Patient's A1c is improved and at goal on her current regimen of glipizide XL 20 mg daily, Januvia 50 mg daily, and Lantus insulin 45 units daily.  Plan: Medications:  continue current medications Home glucose monitoring: Frequency: 2 times a day Timing: before breakfast;before dinner Instruction/counseling given: reminded to get eye exam Educational resources provided: brochure Self management tools provided: copy of home glucose meter download

## 2012-06-23 ENCOUNTER — Encounter (HOSPITAL_COMMUNITY)
Admission: RE | Admit: 2012-06-23 | Discharge: 2012-06-23 | Disposition: A | Discharge status: Home / Self Care | Payer: PRIVATE HEALTH INSURANCE | Source: Ambulatory Visit | Attending: Nephrology | Admitting: Nephrology

## 2012-06-23 ENCOUNTER — Ambulatory Visit (HOSPITAL_COMMUNITY)
Admission: RE | Admit: 2012-06-23 | Discharge: 2012-06-23 | Disposition: A | Discharge status: Home / Self Care | Payer: PRIVATE HEALTH INSURANCE | Source: Ambulatory Visit | Attending: Internal Medicine | Admitting: Internal Medicine

## 2012-06-23 DIAGNOSIS — J449 Chronic obstructive pulmonary disease, unspecified: Secondary | ICD-10-CM | POA: Insufficient documentation

## 2012-06-23 DIAGNOSIS — J9819 Other pulmonary collapse: Secondary | ICD-10-CM | POA: Insufficient documentation

## 2012-06-23 DIAGNOSIS — I517 Cardiomegaly: Secondary | ICD-10-CM | POA: Insufficient documentation

## 2012-06-23 DIAGNOSIS — R0602 Shortness of breath: Secondary | ICD-10-CM | POA: Insufficient documentation

## 2012-06-23 DIAGNOSIS — J4489 Other specified chronic obstructive pulmonary disease: Secondary | ICD-10-CM | POA: Insufficient documentation

## 2012-06-23 DIAGNOSIS — R9389 Abnormal findings on diagnostic imaging of other specified body structures: Secondary | ICD-10-CM | POA: Insufficient documentation

## 2012-06-23 LAB — POCT HEMOGLOBIN-HEMACUE: Hemoglobin: 10.7 g/dL — ABNORMAL LOW (ref 12.0–15.0)

## 2012-06-23 MED ORDER — CLONIDINE HCL 0.1 MG PO TABS: 0.1000 mg | ORAL_TABLET | ORAL | Status: DC | PRN

## 2012-06-23 MED ORDER — EPOETIN ALFA 20000 UNIT/ML IJ SOLN
INTRAMUSCULAR | Status: AC
  Filled 2012-06-23: qty 1

## 2012-06-23 MED ORDER — EPOETIN ALFA 20000 UNIT/ML IJ SOLN
20000.0000 [IU] | INTRAMUSCULAR | Status: DC
  Administered 2012-06-23: 20000 [IU] via SUBCUTANEOUS

## 2012-06-24 ENCOUNTER — Telehealth: Payer: Self-pay | Admitting: Internal Medicine

## 2012-06-24 DIAGNOSIS — R9389 Abnormal findings on diagnostic imaging of other specified body structures: Secondary | ICD-10-CM

## 2012-06-24 DIAGNOSIS — R0602 Shortness of breath: Secondary | ICD-10-CM

## 2012-06-24 NOTE — Telephone Encounter (Signed)
Dg Chest 2 View  06/23/2012   *RADIOLOGY REPORT*  Clinical Data: Shortness of breath  CHEST - 2 VIEW  Comparison: 11/27/2003  Findings: Cardiomegaly is noted.  Central vascular congestion without convincing pulmonary edema.  Old left rib fracture again noted.  Mild basilar atelectasis.  Bony thorax is stable.  Nodular density left base may represent nipple shadow.  Repeat frontal view with nipple markers is recommended for confirmation.  No segmental infiltrate.  IMPRESSION: Central vascular congestion without convincing pulmonary edema. Old left rib fracture again noted.  Mild basilar atelectasis.  Bony thorax is stable.  Nodular density left base may represent nipple shadow.  Repeat frontal view with nipple markers is recommended for confirmation.  No segmental infiltrate.   Original Report Authenticated By: Liviu Pop, M.D.    Chest x-ray findings are noted above; labs were notable for a mildly elevated proBNP of 144 and her creatinine was within the range of her prior values given her chronic renal insufficiency.  I called patient and discussed her chest x-ray findings.  I also have reviewed her chart and I do not see a report from a prior echocardiogram.  I discussed the option of going ahead now to obtain a 2-D echocardiogram to assess her left ventricular function; however, the patient feels that her respiratory symptoms are very mild and she is not inclined to work up further at this time.  She agreed to the plan that if her symptoms become worse or concerning at all, then she will let me know and we can move ahead to obtain a 2-D echocardiogram.  I also discussed the chest x-ray finding of a nodular density in the left base with a recommendation for a repeat chest x-ray with nipple markers; she agreed to return within the next week for the follow-up chest x-ray, and I explained to her that if a repeat CXR does show a nodule then we will need to work it up further with a CT scan.  

## 2012-06-24 NOTE — Assessment & Plan Note (Signed)
Dg Chest 2 View  06/23/2012   *RADIOLOGY REPORT*  Clinical Data: Shortness of breath  CHEST - 2 VIEW  Comparison: 11/27/2003  Findings: Cardiomegaly is noted.  Central vascular congestion without convincing pulmonary edema.  Old left rib fracture again noted.  Mild basilar atelectasis.  Bony thorax is stable.  Nodular density left base may represent nipple shadow.  Repeat frontal view with nipple markers is recommended for confirmation.  No segmental infiltrate.  IMPRESSION: Central vascular congestion without convincing pulmonary edema. Old left rib fracture again noted.  Mild basilar atelectasis.  Bony thorax is stable.  Nodular density left base may represent nipple shadow.  Repeat frontal view with nipple markers is recommended for confirmation.  No segmental infiltrate.   Original Report Authenticated By: Natasha Mead, M.D.    Chest x-ray findings are noted above; labs were notable for a mildly elevated proBNP of 144 and her creatinine was within the range of her prior values given her chronic renal insufficiency.  I called patient and discussed her chest x-ray findings.  I also have reviewed her chart and I do not see a report from a prior echocardiogram.  I discussed the option of going ahead now to obtain a 2-D echocardiogram to assess her left ventricular function; however, the patient feels that her respiratory symptoms are very mild and she is not inclined to work up further at this time.  She agreed to the plan that if her symptoms become worse or concerning at all, then she will let me know and we can move ahead to obtain a 2-D echocardiogram.  I also discussed the chest x-ray finding of a nodular density in the left base with a recommendation for a repeat chest x-ray with nipple markers; she agreed to return within the next week for the follow-up chest x-ray, and I explained to her that if a repeat CXR does show a nodule then we will need to work it up further with a CT scan.

## 2012-06-28 ENCOUNTER — Other Ambulatory Visit: Payer: Self-pay | Admitting: Internal Medicine

## 2012-06-30 ENCOUNTER — Ambulatory Visit (HOSPITAL_COMMUNITY)
Admission: RE | Admit: 2012-06-30 | Discharge: 2012-06-30 | Disposition: A | Discharge status: Home / Self Care | Payer: PRIVATE HEALTH INSURANCE | Source: Ambulatory Visit | Attending: Internal Medicine | Admitting: Internal Medicine

## 2012-06-30 ENCOUNTER — Other Ambulatory Visit: Payer: Self-pay | Admitting: Internal Medicine

## 2012-06-30 ENCOUNTER — Encounter (HOSPITAL_COMMUNITY)
Admission: RE | Admit: 2012-06-30 | Discharge: 2012-06-30 | Disposition: A | Discharge status: Home / Self Care | Payer: PRIVATE HEALTH INSURANCE | Source: Ambulatory Visit | Attending: Nephrology | Admitting: Nephrology

## 2012-06-30 DIAGNOSIS — R9389 Abnormal findings on diagnostic imaging of other specified body structures: Secondary | ICD-10-CM

## 2012-06-30 DIAGNOSIS — R918 Other nonspecific abnormal finding of lung field: Secondary | ICD-10-CM | POA: Insufficient documentation

## 2012-06-30 DIAGNOSIS — R0989 Other specified symptoms and signs involving the circulatory and respiratory systems: Secondary | ICD-10-CM | POA: Insufficient documentation

## 2012-06-30 DIAGNOSIS — J9819 Other pulmonary collapse: Secondary | ICD-10-CM | POA: Insufficient documentation

## 2012-06-30 MED ORDER — EPOETIN ALFA 20000 UNIT/ML IJ SOLN
INTRAMUSCULAR | Status: AC
  Administered 2012-06-30: 20000 [IU] via SUBCUTANEOUS
  Filled 2012-06-30: qty 1

## 2012-06-30 MED ORDER — EPOETIN ALFA 20000 UNIT/ML IJ SOLN: 20000.0000 [IU] | INTRAMUSCULAR | Status: DC

## 2012-07-07 ENCOUNTER — Encounter (HOSPITAL_COMMUNITY): Payer: Medicare Other

## 2012-07-08 ENCOUNTER — Telehealth: Payer: Self-pay | Admitting: *Deleted

## 2012-07-08 NOTE — Telephone Encounter (Signed)
Pt called and is asking for a walker.  She states her foot is swollen and has been like this for 2 weeks.  It is getting better now but swells on and off. Denies SOB, legs not swollen.  She uses [facility] Pt # [phone]

## 2012-07-15 ENCOUNTER — Encounter (HOSPITAL_COMMUNITY)
Admission: RE | Admit: 2012-07-15 | Discharge: 2012-07-15 | Disposition: A | Discharge status: Home / Self Care | Payer: PRIVATE HEALTH INSURANCE | Source: Ambulatory Visit | Attending: Nephrology | Admitting: Nephrology

## 2012-07-15 DIAGNOSIS — D509 Iron deficiency anemia, unspecified: Secondary | ICD-10-CM | POA: Insufficient documentation

## 2012-07-15 LAB — IRON AND TIBC: Saturation Ratios: 37 % (ref 20–55)

## 2012-07-15 LAB — POCT HEMOGLOBIN-HEMACUE: Hemoglobin: 10.6 g/dL — ABNORMAL LOW (ref 12.0–15.0)

## 2012-07-15 MED ORDER — EPOETIN ALFA 20000 UNIT/ML IJ SOLN
20000.0000 [IU] | INTRAMUSCULAR | Status: DC
  Administered 2012-07-15: 20000 [IU] via SUBCUTANEOUS

## 2012-07-15 MED ORDER — EPOETIN ALFA 20000 UNIT/ML IJ SOLN
INTRAMUSCULAR | Status: AC
  Filled 2012-07-15: qty 1

## 2012-07-15 MED ORDER — CLONIDINE HCL 0.1 MG PO TABS: 0.1000 mg | ORAL_TABLET | ORAL | Status: DC | PRN

## 2012-07-15 NOTE — Telephone Encounter (Signed)
Can we find out what kind of walker to order (rolling, standard?).  Also, does she need to be seen in clinic for the foot swelling?

## 2012-07-18 NOTE — Telephone Encounter (Signed)
Talked with pt and she states swelling has improved but she still wants the walker.  She will call for appointment on Friday for this swelling and to arrange for walker.  [facility] )

## 2012-07-22 ENCOUNTER — Encounter (HOSPITAL_COMMUNITY)
Admission: RE | Admit: 2012-07-22 | Discharge: 2012-07-22 | Disposition: A | Discharge status: Home / Self Care | Payer: PRIVATE HEALTH INSURANCE | Source: Ambulatory Visit | Attending: Nephrology | Admitting: Nephrology

## 2012-07-22 LAB — POCT HEMOGLOBIN-HEMACUE: Hemoglobin: 10.3 g/dL — ABNORMAL LOW (ref 12.0–15.0)

## 2012-07-22 MED ORDER — EPOETIN ALFA 20000 UNIT/ML IJ SOLN
INTRAMUSCULAR | Status: AC
  Administered 2012-07-22: 20000 [IU] via SUBCUTANEOUS
  Filled 2012-07-22: qty 1

## 2012-07-22 MED ORDER — CLONIDINE HCL 0.1 MG PO TABS: 0.1000 mg | ORAL_TABLET | ORAL | Status: DC | PRN

## 2012-07-22 MED ORDER — EPOETIN ALFA 20000 UNIT/ML IJ SOLN: 20000.0000 [IU] | INTRAMUSCULAR | Status: DC

## 2012-07-29 ENCOUNTER — Encounter (HOSPITAL_COMMUNITY): Payer: PRIVATE HEALTH INSURANCE

## 2012-07-30 ENCOUNTER — Other Ambulatory Visit: Payer: Self-pay | Admitting: Internal Medicine

## 2012-08-01 ENCOUNTER — Encounter (HOSPITAL_COMMUNITY): Payer: PRIVATE HEALTH INSURANCE

## 2012-08-02 ENCOUNTER — Other Ambulatory Visit: Payer: Self-pay | Admitting: Internal Medicine

## 2012-08-09 ENCOUNTER — Encounter (HOSPITAL_COMMUNITY)
Admission: RE | Admit: 2012-08-09 | Discharge: 2012-08-09 | Disposition: A | Discharge status: Home / Self Care | Payer: PRIVATE HEALTH INSURANCE | Source: Ambulatory Visit | Attending: Nephrology | Admitting: Nephrology

## 2012-08-09 DIAGNOSIS — D509 Iron deficiency anemia, unspecified: Secondary | ICD-10-CM | POA: Insufficient documentation

## 2012-08-09 LAB — IRON AND TIBC
Iron: 77 ug/dL (ref 42–135)
Saturation Ratios: 33 % (ref 20–55)
UIBC: 157 ug/dL (ref 125–400)

## 2012-08-09 MED ORDER — EPOETIN ALFA 20000 UNIT/ML IJ SOLN
INTRAMUSCULAR | Status: AC
  Administered 2012-08-09: 20000 [IU] via SUBCUTANEOUS
  Filled 2012-08-09: qty 1

## 2012-08-09 MED ORDER — EPOETIN ALFA 20000 UNIT/ML IJ SOLN: 20000.0000 [IU] | INTRAMUSCULAR | Status: DC

## 2012-08-09 MED ORDER — CLONIDINE HCL 0.1 MG PO TABS: 0.1000 mg | ORAL_TABLET | ORAL | Status: DC | PRN

## 2012-08-11 ENCOUNTER — Other Ambulatory Visit: Payer: Self-pay

## 2012-08-15 ENCOUNTER — Other Ambulatory Visit (HOSPITAL_COMMUNITY): Payer: Self-pay | Admitting: *Deleted

## 2012-08-16 ENCOUNTER — Encounter (HOSPITAL_COMMUNITY)
Admission: RE | Admit: 2012-08-16 | Discharge: 2012-08-16 | Disposition: A | Discharge status: Home / Self Care | Payer: PRIVATE HEALTH INSURANCE | Source: Ambulatory Visit | Attending: Nephrology | Admitting: Nephrology

## 2012-08-16 MED ORDER — EPOETIN ALFA 20000 UNIT/ML IJ SOLN
INTRAMUSCULAR | Status: AC
  Filled 2012-08-16: qty 1

## 2012-08-16 MED ORDER — CLONIDINE HCL 0.1 MG PO TABS: 0.1000 mg | ORAL_TABLET | ORAL | Status: DC | PRN

## 2012-08-16 MED ORDER — EPOETIN ALFA 20000 UNIT/ML IJ SOLN
20000.0000 [IU] | INTRAMUSCULAR | Status: DC
  Administered 2012-08-16: 20000 [IU] via SUBCUTANEOUS

## 2012-08-23 ENCOUNTER — Encounter (HOSPITAL_COMMUNITY)
Admission: RE | Admit: 2012-08-23 | Discharge: 2012-08-23 | Disposition: A | Discharge status: Home / Self Care | Payer: PRIVATE HEALTH INSURANCE | Source: Ambulatory Visit | Attending: Nephrology | Admitting: Nephrology

## 2012-08-23 MED ORDER — EPOETIN ALFA 20000 UNIT/ML IJ SOLN
INTRAMUSCULAR | Status: AC
  Administered 2012-08-23: 20000 [IU] via SUBCUTANEOUS
  Filled 2012-08-23: qty 1

## 2012-08-23 MED ORDER — CLONIDINE HCL 0.1 MG PO TABS: 0.1000 mg | ORAL_TABLET | ORAL | Status: DC | PRN

## 2012-08-23 MED ORDER — EPOETIN ALFA 40000 UNIT/ML IJ SOLN: 30000.0000 [IU] | INTRAMUSCULAR | Status: DC

## 2012-08-23 MED ORDER — EPOETIN ALFA 10000 UNIT/ML IJ SOLN
INTRAMUSCULAR | Status: AC
  Administered 2012-08-23: 10000 [IU] via SUBCUTANEOUS
  Filled 2012-08-23: qty 1

## 2012-08-31 ENCOUNTER — Encounter (HOSPITAL_COMMUNITY)
Admission: RE | Admit: 2012-08-31 | Discharge: 2012-08-31 | Disposition: A | Discharge status: Home / Self Care | Payer: PRIVATE HEALTH INSURANCE | Source: Ambulatory Visit | Attending: Nephrology | Admitting: Nephrology

## 2012-08-31 MED ORDER — EPOETIN ALFA 20000 UNIT/ML IJ SOLN
INTRAMUSCULAR | Status: AC
  Administered 2012-08-31: 20000 [IU] via SUBCUTANEOUS
  Filled 2012-08-31: qty 1

## 2012-08-31 MED ORDER — EPOETIN ALFA 10000 UNIT/ML IJ SOLN
INTRAMUSCULAR | Status: AC
  Administered 2012-08-31: 10000 [IU] via SUBCUTANEOUS
  Filled 2012-08-31: qty 1

## 2012-08-31 MED ORDER — EPOETIN ALFA 40000 UNIT/ML IJ SOLN: 30000.0000 [IU] | INTRAMUSCULAR | Status: DC

## 2012-08-31 MED ORDER — CLONIDINE HCL 0.1 MG PO TABS: 0.1000 mg | ORAL_TABLET | ORAL | Status: DC | PRN

## 2012-09-01 ENCOUNTER — Other Ambulatory Visit: Payer: Self-pay | Admitting: Internal Medicine

## 2012-09-02 ENCOUNTER — Other Ambulatory Visit: Payer: Self-pay | Admitting: Internal Medicine

## 2012-09-02 NOTE — Telephone Encounter (Signed)
Refill approved - nurse to call in. 

## 2012-09-02 NOTE — Telephone Encounter (Signed)
Rx called in 

## 2012-09-07 ENCOUNTER — Encounter (HOSPITAL_COMMUNITY)
Admission: RE | Admit: 2012-09-07 | Discharge: 2012-09-07 | Disposition: A | Discharge status: Home / Self Care | Payer: PRIVATE HEALTH INSURANCE | Source: Ambulatory Visit | Attending: Nephrology | Admitting: Nephrology

## 2012-09-07 DIAGNOSIS — D509 Iron deficiency anemia, unspecified: Secondary | ICD-10-CM | POA: Diagnosis present

## 2012-09-07 DIAGNOSIS — N189 Chronic kidney disease, unspecified: Secondary | ICD-10-CM | POA: Diagnosis not present

## 2012-09-07 DIAGNOSIS — I129 Hypertensive chronic kidney disease with stage 1 through stage 4 chronic kidney disease, or unspecified chronic kidney disease: Secondary | ICD-10-CM | POA: Insufficient documentation

## 2012-09-07 DIAGNOSIS — D631 Anemia in chronic kidney disease: Secondary | ICD-10-CM | POA: Diagnosis not present

## 2012-09-07 LAB — HEMOGLOBIN AND HEMATOCRIT, BLOOD
HCT: 34.5 % — ABNORMAL LOW (ref 36.0–46.0)
Hemoglobin: 10.7 g/dL — ABNORMAL LOW (ref 12.0–15.0)

## 2012-09-07 LAB — RENAL FUNCTION PANEL
CO2: 25 mEq/L (ref 19–32)
Chloride: 106 mEq/L (ref 96–112)
Creatinine, Ser: 1.64 mg/dL — ABNORMAL HIGH (ref 0.50–1.10)
GFR calc Af Amer: 36 mL/min — ABNORMAL LOW (ref >90)
GFR calc non Af Amer: 31 mL/min — ABNORMAL LOW (ref >90)
Potassium: 4.3 mEq/L (ref 3.5–5.1)

## 2012-09-07 LAB — URINE MICROSCOPIC-ADD ON

## 2012-09-07 LAB — URINALYSIS, ROUTINE W REFLEX MICROSCOPIC
Nitrite: NEGATIVE
Specific Gravity, Urine: 1.013 (ref 1.005–1.030)
Urobilinogen, UA: 0.2 mg/dL (ref 0.0–1.0)
pH: 5.5 (ref 5.0–8.0)

## 2012-09-07 LAB — MAGNESIUM: Magnesium: 1.8 mg/dL (ref 1.5–2.5)

## 2012-09-07 LAB — IRON AND TIBC: Saturation Ratios: 21 % (ref 20–55)

## 2012-09-07 MED ORDER — EPOETIN ALFA 20000 UNIT/ML IJ SOLN
INTRAMUSCULAR | Status: AC
  Administered 2012-09-07: 20000 [IU] via SUBCUTANEOUS
  Filled 2012-09-07: qty 1

## 2012-09-07 MED ORDER — CLONIDINE HCL 0.1 MG PO TABS: 0.1000 mg | ORAL_TABLET | ORAL | Status: DC | PRN

## 2012-09-07 MED ORDER — EPOETIN ALFA 40000 UNIT/ML IJ SOLN: 30000.0000 [IU] | INTRAMUSCULAR | Status: DC

## 2012-09-07 MED ORDER — EPOETIN ALFA 10000 UNIT/ML IJ SOLN
INTRAMUSCULAR | Status: AC
  Administered 2012-09-07: 10000 [IU] via SUBCUTANEOUS
  Filled 2012-09-07: qty 1

## 2012-09-08 LAB — VITAMIN D 25 HYDROXY (VIT D DEFICIENCY, FRACTURES): Vit D, 25-Hydroxy: 34 ng/mL (ref 30–89)

## 2012-09-09 LAB — URINE CULTURE

## 2012-09-15 ENCOUNTER — Encounter (HOSPITAL_COMMUNITY)
Admission: RE | Admit: 2012-09-15 | Discharge: 2012-09-15 | Disposition: A | Discharge status: Home / Self Care | Payer: PRIVATE HEALTH INSURANCE | Source: Ambulatory Visit | Attending: Nephrology | Admitting: Nephrology

## 2012-09-15 DIAGNOSIS — N039 Chronic nephritic syndrome with unspecified morphologic changes: Secondary | ICD-10-CM | POA: Diagnosis not present

## 2012-09-15 MED ORDER — EPOETIN ALFA 40000 UNIT/ML IJ SOLN: 30000.0000 [IU] | INTRAMUSCULAR | Status: DC

## 2012-09-15 MED ORDER — CLONIDINE HCL 0.1 MG PO TABS: 0.1000 mg | ORAL_TABLET | ORAL | Status: DC | PRN

## 2012-09-29 ENCOUNTER — Encounter (HOSPITAL_COMMUNITY)
Admission: RE | Admit: 2012-09-29 | Discharge: 2012-09-29 | Disposition: A | Discharge status: Home / Self Care | Payer: PRIVATE HEALTH INSURANCE | Source: Ambulatory Visit | Attending: Nephrology | Admitting: Nephrology

## 2012-09-29 DIAGNOSIS — D631 Anemia in chronic kidney disease: Secondary | ICD-10-CM | POA: Diagnosis not present

## 2012-09-29 LAB — IRON AND TIBC
Iron: 82 ug/dL (ref 42–135)
TIBC: 217 ug/dL — ABNORMAL LOW (ref 250–470)

## 2012-09-29 LAB — POCT HEMOGLOBIN-HEMACUE: Hemoglobin: 11.2 g/dL — ABNORMAL LOW (ref 12.0–15.0)

## 2012-09-29 MED ORDER — EPOETIN ALFA 40000 UNIT/ML IJ SOLN: 30000.0000 [IU] | INTRAMUSCULAR | Status: DC

## 2012-09-29 MED ORDER — EPOETIN ALFA 10000 UNIT/ML IJ SOLN
INTRAMUSCULAR | Status: AC
  Administered 2012-09-29: 10000 [IU]
  Filled 2012-09-29: qty 1

## 2012-09-29 MED ORDER — EPOETIN ALFA 20000 UNIT/ML IJ SOLN
INTRAMUSCULAR | Status: AC
  Administered 2012-09-29: 20000 [IU]
  Filled 2012-09-29: qty 1

## 2012-09-29 MED ORDER — CLONIDINE HCL 0.1 MG PO TABS: 0.1000 mg | ORAL_TABLET | ORAL | Status: DC | PRN

## 2012-10-06 ENCOUNTER — Encounter (HOSPITAL_COMMUNITY)
Admission: RE | Admit: 2012-10-06 | Discharge: 2012-10-06 | Disposition: A | Discharge status: Home / Self Care | Payer: PRIVATE HEALTH INSURANCE | Source: Ambulatory Visit | Attending: Nephrology | Admitting: Nephrology

## 2012-10-06 DIAGNOSIS — Z853 Personal history of malignant neoplasm of breast: Secondary | ICD-10-CM | POA: Diagnosis not present

## 2012-10-06 DIAGNOSIS — I129 Hypertensive chronic kidney disease with stage 1 through stage 4 chronic kidney disease, or unspecified chronic kidney disease: Secondary | ICD-10-CM | POA: Insufficient documentation

## 2012-10-06 DIAGNOSIS — D638 Anemia in other chronic diseases classified elsewhere: Secondary | ICD-10-CM | POA: Diagnosis present

## 2012-10-06 DIAGNOSIS — N183 Chronic kidney disease, stage 3 unspecified: Secondary | ICD-10-CM | POA: Insufficient documentation

## 2012-10-06 DIAGNOSIS — M948X9 Other specified disorders of cartilage, unspecified sites: Secondary | ICD-10-CM | POA: Diagnosis not present

## 2012-10-06 MED ORDER — EPOETIN ALFA 40000 UNIT/ML IJ SOLN: 30000.0000 [IU] | INTRAMUSCULAR | Status: DC

## 2012-10-06 MED ORDER — EPOETIN ALFA 10000 UNIT/ML IJ SOLN
INTRAMUSCULAR | Status: AC
  Administered 2012-10-06: 14:00:00
  Filled 2012-10-06: qty 1

## 2012-10-06 MED ORDER — CLONIDINE HCL 0.1 MG PO TABS: 0.1000 mg | ORAL_TABLET | ORAL | Status: DC | PRN

## 2012-10-06 MED ORDER — EPOETIN ALFA 20000 UNIT/ML IJ SOLN
INTRAMUSCULAR | Status: AC
  Administered 2012-10-06: 14:00:00
  Filled 2012-10-06: qty 1

## 2012-10-13 ENCOUNTER — Encounter (HOSPITAL_COMMUNITY)
Admission: RE | Admit: 2012-10-13 | Discharge: 2012-10-13 | Disposition: A | Discharge status: Home / Self Care | Payer: PRIVATE HEALTH INSURANCE | Source: Ambulatory Visit | Attending: Nephrology | Admitting: Nephrology

## 2012-10-13 DIAGNOSIS — D638 Anemia in other chronic diseases classified elsewhere: Secondary | ICD-10-CM | POA: Diagnosis not present

## 2012-10-13 LAB — POCT HEMOGLOBIN-HEMACUE: Hemoglobin: 10.2 g/dL — ABNORMAL LOW (ref 12.0–15.0)

## 2012-10-13 MED ORDER — EPOETIN ALFA 20000 UNIT/ML IJ SOLN
INTRAMUSCULAR | Status: AC
  Administered 2012-10-13: 20000 [IU] via SUBCUTANEOUS
  Filled 2012-10-13: qty 1

## 2012-10-13 MED ORDER — EPOETIN ALFA 10000 UNIT/ML IJ SOLN
INTRAMUSCULAR | Status: AC
  Administered 2012-10-13: 10000 [IU] via SUBCUTANEOUS
  Filled 2012-10-13: qty 1

## 2012-10-21 ENCOUNTER — Encounter (HOSPITAL_COMMUNITY)
Admission: RE | Admit: 2012-10-21 | Discharge: 2012-10-21 | Disposition: A | Discharge status: Home / Self Care | Payer: PRIVATE HEALTH INSURANCE | Source: Ambulatory Visit | Attending: Nephrology | Admitting: Nephrology

## 2012-10-21 DIAGNOSIS — D638 Anemia in other chronic diseases classified elsewhere: Secondary | ICD-10-CM | POA: Diagnosis not present

## 2012-10-21 LAB — POCT HEMOGLOBIN-HEMACUE: Hemoglobin: 8.2 g/dL — ABNORMAL LOW (ref 12.0–15.0)

## 2012-10-21 MED ORDER — EPOETIN ALFA 40000 UNIT/ML IJ SOLN
30000.0000 [IU] | INTRAMUSCULAR | Status: DC
  Administered 2012-10-21: 30000 [IU] via SUBCUTANEOUS

## 2012-10-21 MED ORDER — CLONIDINE HCL 0.1 MG PO TABS: 0.1000 mg | ORAL_TABLET | ORAL | Status: DC | PRN

## 2012-10-26 ENCOUNTER — Telehealth: Payer: Self-pay | Admitting: *Deleted

## 2012-10-26 NOTE — Telephone Encounter (Signed)
Pt calls and states she was told by dr patel [facility]) that she needed an iron supplement and to call her pcp for this, she states she cannot afford to pay for it and could you prescribe something that needs a script so it will be paid for, please advise

## 2012-10-27 NOTE — Telephone Encounter (Signed)
I am not aware of an oral iron supplement that is not an OTC medication.  The cost of ferrous sulfate OTC is likely less that $4 a month.  According to her chart, she is already on ferrous sulfate; is she taking it currently?

## 2012-10-28 ENCOUNTER — Encounter (HOSPITAL_COMMUNITY)
Admission: RE | Admit: 2012-10-28 | Discharge: 2012-10-28 | Disposition: A | Discharge status: Home / Self Care | Payer: PRIVATE HEALTH INSURANCE | Source: Ambulatory Visit | Attending: Nephrology | Admitting: Nephrology

## 2012-10-28 DIAGNOSIS — D638 Anemia in other chronic diseases classified elsewhere: Secondary | ICD-10-CM | POA: Diagnosis not present

## 2012-10-28 LAB — POCT HEMOGLOBIN-HEMACUE: Hemoglobin: 10.9 g/dL — ABNORMAL LOW (ref 12.0–15.0)

## 2012-10-28 MED ORDER — CLONIDINE HCL 0.1 MG PO TABS: 0.1000 mg | ORAL_TABLET | ORAL | Status: DC | PRN

## 2012-10-28 MED ORDER — EPOETIN ALFA 40000 UNIT/ML IJ SOLN: 30000.0000 [IU] | INTRAMUSCULAR | Status: DC

## 2012-10-28 MED ORDER — EPOETIN ALFA 20000 UNIT/ML IJ SOLN
INTRAMUSCULAR | Status: AC
  Administered 2012-10-28: 20000 [IU] via SUBCUTANEOUS
  Filled 2012-10-28: qty 1

## 2012-10-28 MED ORDER — EPOETIN ALFA 10000 UNIT/ML IJ SOLN
INTRAMUSCULAR | Status: AC
  Administered 2012-10-28: 10000 [IU] via SUBCUTANEOUS
  Filled 2012-10-28: qty 1

## 2012-10-31 ENCOUNTER — Other Ambulatory Visit: Payer: Self-pay | Admitting: Internal Medicine

## 2012-11-01 ENCOUNTER — Other Ambulatory Visit: Payer: Self-pay | Admitting: Internal Medicine

## 2012-11-02 NOTE — Telephone Encounter (Signed)
Rx called in 

## 2012-11-02 NOTE — Telephone Encounter (Signed)
Refill approved - nurse to call in. 

## 2012-11-03 ENCOUNTER — Encounter (HOSPITAL_COMMUNITY)
Admission: RE | Admit: 2012-11-03 | Discharge: 2012-11-03 | Disposition: A | Discharge status: Home / Self Care | Payer: PRIVATE HEALTH INSURANCE | Source: Ambulatory Visit | Attending: Nephrology | Admitting: Nephrology

## 2012-11-03 DIAGNOSIS — D638 Anemia in other chronic diseases classified elsewhere: Secondary | ICD-10-CM | POA: Diagnosis not present

## 2012-11-03 DIAGNOSIS — D509 Iron deficiency anemia, unspecified: Secondary | ICD-10-CM | POA: Diagnosis present

## 2012-11-03 DIAGNOSIS — I129 Hypertensive chronic kidney disease with stage 1 through stage 4 chronic kidney disease, or unspecified chronic kidney disease: Secondary | ICD-10-CM | POA: Diagnosis not present

## 2012-11-03 DIAGNOSIS — N183 Chronic kidney disease, stage 3 unspecified: Secondary | ICD-10-CM | POA: Insufficient documentation

## 2012-11-03 MED ORDER — EPOETIN ALFA 20000 UNIT/ML IJ SOLN
INTRAMUSCULAR | Status: AC
  Filled 2012-11-03: qty 1

## 2012-11-03 MED ORDER — EPOETIN ALFA 10000 UNIT/ML IJ SOLN
INTRAMUSCULAR | Status: AC
  Administered 2012-11-03: 10000 [IU] via SUBCUTANEOUS
  Filled 2012-11-03: qty 1

## 2012-11-03 MED ORDER — CLONIDINE HCL 0.1 MG PO TABS: 0.1000 mg | ORAL_TABLET | ORAL | Status: DC | PRN

## 2012-11-03 MED ORDER — EPOETIN ALFA 40000 UNIT/ML IJ SOLN: 30000.0000 [IU] | INTRAMUSCULAR | Status: DC

## 2012-11-03 MED ORDER — EPOETIN ALFA 20000 UNIT/ML IJ SOLN
INTRAMUSCULAR | Status: AC
  Administered 2012-11-03: 14:00:00 20000 [IU] via SUBCUTANEOUS
  Filled 2012-11-03: qty 1

## 2012-11-09 NOTE — Telephone Encounter (Signed)
i finally spoke w/ pt, she states what she gets is $10.00 and that is too much, i tried to explain to her that ferrous sulfate should be cheaper, i ask her about other pharmacies and she stated she only uses [facility]. She will discuss this at her next appt and maybe then she will understand about the ferrous sulfate and cheaper places

## 2012-11-10 ENCOUNTER — Encounter (HOSPITAL_COMMUNITY): Payer: PRIVATE HEALTH INSURANCE

## 2012-11-17 ENCOUNTER — Encounter (HOSPITAL_COMMUNITY)
Admission: RE | Admit: 2012-11-17 | Discharge: 2012-11-17 | Disposition: A | Discharge status: Home / Self Care | Payer: PRIVATE HEALTH INSURANCE | Source: Ambulatory Visit | Attending: Nephrology | Admitting: Nephrology

## 2012-11-17 DIAGNOSIS — D638 Anemia in other chronic diseases classified elsewhere: Secondary | ICD-10-CM | POA: Diagnosis not present

## 2012-11-17 LAB — POCT HEMOGLOBIN-HEMACUE: Hemoglobin: 11.1 g/dL — ABNORMAL LOW (ref 12.0–15.0)

## 2012-11-17 MED ORDER — EPOETIN ALFA 40000 UNIT/ML IJ SOLN: 30000.0000 [IU] | INTRAMUSCULAR | Status: DC

## 2012-11-17 MED ORDER — EPOETIN ALFA 10000 UNIT/ML IJ SOLN
INTRAMUSCULAR | Status: AC
  Administered 2012-11-17: 13:00:00 10000 [IU] via SUBCUTANEOUS
  Filled 2012-11-17: qty 1

## 2012-11-17 MED ORDER — EPOETIN ALFA 20000 UNIT/ML IJ SOLN
INTRAMUSCULAR | Status: AC
  Administered 2012-11-17: 20000 [IU] via SUBCUTANEOUS
  Filled 2012-11-17: qty 1

## 2012-11-17 MED ORDER — CLONIDINE HCL 0.1 MG PO TABS: 0.1000 mg | ORAL_TABLET | ORAL | Status: DC | PRN

## 2012-11-24 ENCOUNTER — Encounter (HOSPITAL_COMMUNITY)
Admission: RE | Admit: 2012-11-24 | Discharge: 2012-11-24 | Disposition: A | Discharge status: Home / Self Care | Payer: PRIVATE HEALTH INSURANCE | Source: Ambulatory Visit | Attending: Nephrology | Admitting: Nephrology

## 2012-11-24 DIAGNOSIS — D638 Anemia in other chronic diseases classified elsewhere: Secondary | ICD-10-CM | POA: Diagnosis not present

## 2012-11-24 LAB — POCT HEMOGLOBIN-HEMACUE: Hemoglobin: 11.6 g/dL — ABNORMAL LOW (ref 12.0–15.0)

## 2012-11-24 MED ORDER — EPOETIN ALFA 40000 UNIT/ML IJ SOLN: 30000.0000 [IU] | INTRAMUSCULAR | Status: DC

## 2012-11-24 MED ORDER — CLONIDINE HCL 0.1 MG PO TABS: 0.1000 mg | ORAL_TABLET | ORAL | Status: DC | PRN

## 2012-11-25 LAB — FERRITIN: Ferritin: 533 ng/mL — ABNORMAL HIGH (ref 10–291)

## 2012-11-25 LAB — IRON AND TIBC
Iron: 52 ug/dL (ref 42–135)
Saturation Ratios: 25 % (ref 20–55)
TIBC: 207 ug/dL — ABNORMAL LOW (ref 250–470)
UIBC: 155 ug/dL (ref 125–400)

## 2012-12-02 ENCOUNTER — Other Ambulatory Visit: Payer: Self-pay | Admitting: *Deleted

## 2012-12-02 MED ORDER — FERROUS SULFATE 325 (65 FE) MG PO TABS: 325.0000 mg | ORAL_TABLET | Freq: Two times a day (BID) | ORAL | Status: DC

## 2012-12-07 ENCOUNTER — Other Ambulatory Visit (HOSPITAL_COMMUNITY): Payer: Self-pay | Admitting: *Deleted

## 2012-12-08 ENCOUNTER — Encounter (HOSPITAL_COMMUNITY)
Admission: RE | Admit: 2012-12-08 | Discharge: 2012-12-08 | Disposition: A | Discharge status: Home / Self Care | Payer: PRIVATE HEALTH INSURANCE | Source: Ambulatory Visit | Attending: Nephrology | Admitting: Nephrology

## 2012-12-08 DIAGNOSIS — I129 Hypertensive chronic kidney disease with stage 1 through stage 4 chronic kidney disease, or unspecified chronic kidney disease: Secondary | ICD-10-CM | POA: Insufficient documentation

## 2012-12-08 DIAGNOSIS — D509 Iron deficiency anemia, unspecified: Secondary | ICD-10-CM | POA: Diagnosis present

## 2012-12-08 DIAGNOSIS — D631 Anemia in chronic kidney disease: Secondary | ICD-10-CM | POA: Insufficient documentation

## 2012-12-08 DIAGNOSIS — N183 Chronic kidney disease, stage 3 unspecified: Secondary | ICD-10-CM | POA: Insufficient documentation

## 2012-12-08 MED ORDER — EPOETIN ALFA 40000 UNIT/ML IJ SOLN: 30000.0000 [IU] | INTRAMUSCULAR | Status: DC

## 2012-12-08 MED ORDER — EPOETIN ALFA 10000 UNIT/ML IJ SOLN
INTRAMUSCULAR | Status: AC
  Administered 2012-12-08: 14:00:00 10000 [IU] via SUBCUTANEOUS
  Filled 2012-12-08: qty 1

## 2012-12-08 MED ORDER — EPOETIN ALFA 20000 UNIT/ML IJ SOLN
INTRAMUSCULAR | Status: AC
  Administered 2012-12-08: 14:00:00 20000 [IU] via SUBCUTANEOUS
  Filled 2012-12-08: qty 1

## 2012-12-08 MED ORDER — CLONIDINE HCL 0.1 MG PO TABS: 0.1000 mg | ORAL_TABLET | ORAL | Status: DC | PRN

## 2012-12-12 ENCOUNTER — Other Ambulatory Visit: Payer: Self-pay

## 2012-12-12 DIAGNOSIS — Z1231 Encounter for screening mammogram for malignant neoplasm of breast: Secondary | ICD-10-CM

## 2012-12-15 ENCOUNTER — Encounter (HOSPITAL_COMMUNITY)
Admission: RE | Admit: 2012-12-15 | Discharge: 2012-12-15 | Disposition: A | Discharge status: Home / Self Care | Payer: PRIVATE HEALTH INSURANCE | Source: Ambulatory Visit | Attending: Nephrology | Admitting: Nephrology

## 2012-12-15 DIAGNOSIS — D631 Anemia in chronic kidney disease: Secondary | ICD-10-CM | POA: Diagnosis not present

## 2012-12-15 LAB — POCT HEMOGLOBIN-HEMACUE: Hemoglobin: 11.3 g/dL — ABNORMAL LOW (ref 12.0–15.0)

## 2012-12-15 MED ORDER — CLONIDINE HCL 0.1 MG PO TABS: 0.1000 mg | ORAL_TABLET | ORAL | Status: DC | PRN

## 2012-12-15 MED ORDER — EPOETIN ALFA 10000 UNIT/ML IJ SOLN
INTRAMUSCULAR | Status: AC
  Administered 2012-12-15: 14:00:00 10000 [IU]
  Filled 2012-12-15: qty 1

## 2012-12-15 MED ORDER — EPOETIN ALFA 40000 UNIT/ML IJ SOLN: 30000.0000 [IU] | INTRAMUSCULAR | Status: DC

## 2012-12-15 MED ORDER — EPOETIN ALFA 20000 UNIT/ML IJ SOLN
INTRAMUSCULAR | Status: AC
  Administered 2012-12-15: 14:00:00 20000 [IU]
  Filled 2012-12-15: qty 1

## 2012-12-22 ENCOUNTER — Encounter (HOSPITAL_COMMUNITY)
Admission: RE | Admit: 2012-12-22 | Discharge: 2012-12-22 | Disposition: A | Discharge status: Home / Self Care | Payer: PRIVATE HEALTH INSURANCE | Source: Ambulatory Visit | Attending: Nephrology | Admitting: Nephrology

## 2012-12-22 DIAGNOSIS — D631 Anemia in chronic kidney disease: Secondary | ICD-10-CM | POA: Diagnosis not present

## 2012-12-22 LAB — IRON AND TIBC: Iron: 30 ug/dL — ABNORMAL LOW (ref 42–135)

## 2012-12-22 MED ORDER — CLONIDINE HCL 0.1 MG PO TABS: 0.1000 mg | ORAL_TABLET | ORAL | Status: DC | PRN

## 2012-12-22 MED ORDER — EPOETIN ALFA 40000 UNIT/ML IJ SOLN: 30000.0000 [IU] | INTRAMUSCULAR | Status: DC

## 2012-12-26 ENCOUNTER — Other Ambulatory Visit: Payer: Self-pay | Admitting: *Deleted

## 2012-12-26 MED ORDER — FUROSEMIDE 40 MG PO TABS: 40.0000 mg | ORAL_TABLET | Freq: Every day | ORAL | Status: DC

## 2012-12-26 NOTE — Telephone Encounter (Signed)
Last visit 5/21

## 2012-12-30 ENCOUNTER — Other Ambulatory Visit: Payer: Self-pay | Admitting: Internal Medicine

## 2012-12-30 ENCOUNTER — Ambulatory Visit: Payer: PRIVATE HEALTH INSURANCE

## 2013-01-02 ENCOUNTER — Other Ambulatory Visit: Payer: Self-pay | Admitting: *Deleted

## 2013-01-02 MED ORDER — SITAGLIPTIN PHOSPHATE 50 MG PO TABS: 50.0000 mg | ORAL_TABLET | Freq: Every day | ORAL | Status: DC

## 2013-01-02 NOTE — Telephone Encounter (Signed)
Refill approved - nurse to call in. 

## 2013-01-02 NOTE — Telephone Encounter (Signed)
Patient takes PRN and uses #60 per month.

## 2013-01-02 NOTE — Telephone Encounter (Signed)
Called to pharm Dr Meredith Pel did you mean #60, the directions state 1 tablet 3 times daily as needed Just checking

## 2013-01-04 ENCOUNTER — Ambulatory Visit (INDEPENDENT_AMBULATORY_CARE_PROVIDER_SITE_OTHER): Payer: PRIVATE HEALTH INSURANCE | Admitting: Internal Medicine

## 2013-01-04 ENCOUNTER — Encounter: Payer: Self-pay | Admitting: Internal Medicine

## 2013-01-04 VITALS — BP 154/77 | HR 73 | Temp 97.4°F | Ht 63.0 in | Wt 259.5 lb

## 2013-01-04 DIAGNOSIS — E785 Hyperlipidemia, unspecified: Secondary | ICD-10-CM

## 2013-01-04 DIAGNOSIS — Z23 Encounter for immunization: Secondary | ICD-10-CM

## 2013-01-04 DIAGNOSIS — G4730 Sleep apnea, unspecified: Secondary | ICD-10-CM

## 2013-01-04 DIAGNOSIS — J4489 Other specified chronic obstructive pulmonary disease: Secondary | ICD-10-CM

## 2013-01-04 DIAGNOSIS — E119 Type 2 diabetes mellitus without complications: Secondary | ICD-10-CM

## 2013-01-04 DIAGNOSIS — J449 Chronic obstructive pulmonary disease, unspecified: Secondary | ICD-10-CM

## 2013-01-04 DIAGNOSIS — I10 Essential (primary) hypertension: Secondary | ICD-10-CM

## 2013-01-04 DIAGNOSIS — N189 Chronic kidney disease, unspecified: Secondary | ICD-10-CM

## 2013-01-04 LAB — COMPLETE METABOLIC PANEL WITHOUT GFR
ALT: 15 U/L (ref 0–35)
AST: 17 U/L (ref 0–37)
Albumin: 3.9 g/dL (ref 3.5–5.2)
Alkaline Phosphatase: 81 U/L (ref 39–117)
BUN: 35 mg/dL — ABNORMAL HIGH (ref 6–23)
CO2: 22 meq/L (ref 19–32)
Calcium: 10.4 mg/dL (ref 8.4–10.5)
Chloride: 109 meq/L (ref 96–112)
Creat: 1.84 mg/dL — ABNORMAL HIGH (ref 0.50–1.10)
GFR, Est African American: 32 mL/min — ABNORMAL LOW
GFR, Est Non African American: 28 mL/min — ABNORMAL LOW
Glucose, Bld: 213 mg/dL — ABNORMAL HIGH (ref 70–99)
Potassium: 4 meq/L (ref 3.5–5.3)
Sodium: 143 meq/L (ref 135–145)
Total Bilirubin: 0.2 mg/dL — ABNORMAL LOW (ref 0.3–1.2)
Total Protein: 7.6 g/dL (ref 6.0–8.3)

## 2013-01-04 LAB — LIPID PANEL
Cholesterol: 105 mg/dL (ref 0–200)
HDL: 31 mg/dL — ABNORMAL LOW
LDL Cholesterol: 21 mg/dL (ref 0–99)
Total CHOL/HDL Ratio: 3.4 ratio
Triglycerides: 263 mg/dL — ABNORMAL HIGH
VLDL: 53 mg/dL — ABNORMAL HIGH (ref 0–40)

## 2013-01-04 LAB — CBC WITH DIFFERENTIAL/PLATELET
Basophils Absolute: 0 10*3/uL (ref 0.0–0.1)
Basophils Relative: 1 % (ref 0–1)
HCT: 34.6 % — ABNORMAL LOW (ref 36.0–46.0)
Hemoglobin: 10.6 g/dL — ABNORMAL LOW (ref 12.0–15.0)
Lymphocytes Relative: 26 % (ref 12–46)
Lymphs Abs: 2.1 10*3/uL (ref 0.7–4.0)
MCV: 74.7 fL — ABNORMAL LOW (ref 78.0–100.0)
Monocytes Absolute: 0.6 10*3/uL (ref 0.1–1.0)
Monocytes Relative: 7 % (ref 3–12)
Neutro Abs: 5.2 10*3/uL (ref 1.7–7.7)
RBC: 4.63 MIL/uL (ref 3.87–5.11)
RDW: 17.1 % — ABNORMAL HIGH (ref 11.5–15.5)
WBC: 8.2 10*3/uL (ref 4.0–10.5)

## 2013-01-04 LAB — GLUCOSE, CAPILLARY: Glucose-Capillary: 193 mg/dL — ABNORMAL HIGH (ref 70–99)

## 2013-01-04 LAB — POCT GLYCOSYLATED HEMOGLOBIN (HGB A1C): Hemoglobin A1C: 7.1

## 2013-01-04 NOTE — Assessment & Plan Note (Signed)
Lipids:    Component Value Date/Time   CHOL 118 09/16/2011 1211   TRIG 268* 09/16/2011 1211   HDL 33* 09/16/2011 1211   LDLCALC 31 09/16/2011 1211   VLDL 54* 09/16/2011 1211   CHOLHDL 3.6 09/16/2011 1211    Assessment: Patient is doing well on on current regimen of simvastatin 20 mg daily, with no apparent side effects.  Plan: Continue simvastatin 20 mg daily; check a lipid panel.

## 2013-01-04 NOTE — Assessment & Plan Note (Signed)
Lab Results  Component Value Date   HGBA1C 7.1 01/04/2013   HGBA1C 6.3 06/22/2012   HGBA1C 7.7 01/20/2012     Assessment: Diabetes control: fair control Progress toward A1C goal:  deteriorated Comments: Hemoglobin A1c is slightly above goal on glipizide XL 20 mg daily, Lantus Solostar insulin 45 units at bedtime, and sitagliptin (Januvia) 50 mg daily  Plan: Medications:  continue current medications, and patient will work on her diet until next visit Home glucose monitoring: Frequency: 2 times a day Timing: before breakfast;before dinner Instruction/counseling given: reminded to get eye exam Educational resources provided:   Self management tools provided:   Other plans: Check a urine microalbumin to creatinine ratio

## 2013-01-04 NOTE — Progress Notes (Signed)
   Subjective:    Patient ID: Joan Banks, female    DOB: March 19, 1945, 67 y.o.   MRN: [id number]  HPI Patient returns for followup of her diabetes mellitus, hypertension, hyperlipidemia, and other chronic medical problems.  She has no acute complaints today.  She reports that she has been compliant with her medications, although she has not yet taken her antihypertensive medications today .  She has a mammogram appointment scheduled for December 17 at the [facility]; she reports that she has not seen her ophthalmologist in over a year.  She says that she is compliant with her CPAP.  Her home blood glucose measurements over the past month have averaged 145, with a range from 65-275.    Review of Systems  Respiratory: Negative for shortness of breath and wheezing.   Cardiovascular: Positive for leg swelling (Mild, improves with elevation of her legs). Negative for chest pain.  Gastrointestinal: Negative for nausea, vomiting and abdominal pain.  Genitourinary: Negative for dysuria.       Objective:   Physical Exam  Constitutional: No distress.  Cardiovascular: Normal rate, regular rhythm, S1 normal and S2 normal.  Exam reveals no gallop, no S3, no S4 and no friction rub.   Murmur heard.  Systolic murmur is present with a grade of 1/6  Trace bilateral ankle edema       Assessment & Plan:

## 2013-01-04 NOTE — Assessment & Plan Note (Signed)
Assessment: Patient reports doing well symptomatically on Combivent.  Plan: Continue current inhaler regimen.

## 2013-01-04 NOTE — Assessment & Plan Note (Signed)
Lab Results  Component Value Date   CREATININE 1.64* 09/07/2012   CREATININE 1.76* 06/22/2012   CREATININE 1.55* 03/31/2012   CREATININE 1.72* 01/20/2012   CREATININE 1.57* 09/24/2011   CREATININE 1.61* 09/16/2011     Assessment: Patient is followed by nephrologist Dr. Allena Katz.  Her creatinine has been stable.  Plan: Check a metabolic panel and CBC today; patient will follow up with Dr. Allena Katz.

## 2013-01-04 NOTE — Patient Instructions (Addendum)
Continue current medications. Please schedule an appointment with your eye doctor.

## 2013-01-04 NOTE — Assessment & Plan Note (Signed)
SpO2 Readings from Last 3 Encounters:  01/04/13 99%  12/22/12 70%  12/15/12 95%   Assessment: Patient reports that she uses her CPAP; she reports no problems with daytime somnolence or morning headaches.  Plan: Continue CPAP.

## 2013-01-04 NOTE — Assessment & Plan Note (Signed)
BP Readings from Last 3 Encounters:  01/04/13 154/77  12/22/12 141/67  12/15/12 158/72    Lab Results  Component Value Date   NA 141 09/07/2012   K 4.3 09/07/2012   CREATININE 1.64* 09/07/2012    Assessment: Blood pressure control: mildly elevated Progress toward BP goal:  deteriorated Comments: Patient's blood pressure is moderately elevated on amlodipine 10 mg daily, atenolol 100 mg daily, clonidine 0.3 mg twice a day, enalapril 20 mg twice a day, and furosemide 40 mg daily.  However, she has not yet taken her blood pressure medicine today.  Plan: Medications:  continue current medications

## 2013-01-05 ENCOUNTER — Other Ambulatory Visit (HOSPITAL_COMMUNITY): Payer: Self-pay | Admitting: *Deleted

## 2013-01-05 LAB — MICROALBUMIN / CREATININE URINE RATIO
Creatinine, Urine: 82.6 mg/dL
Microalb Creat Ratio: 287.4 mg/g — ABNORMAL HIGH (ref 0.0–30.0)
Microalb, Ur: 23.74 mg/dL — ABNORMAL HIGH (ref 0.00–1.89)

## 2013-01-06 ENCOUNTER — Encounter (HOSPITAL_COMMUNITY)
Admission: RE | Admit: 2013-01-06 | Discharge: 2013-01-06 | Disposition: A | Discharge status: Home / Self Care | Payer: PRIVATE HEALTH INSURANCE | Source: Ambulatory Visit | Attending: Nephrology | Admitting: Nephrology

## 2013-01-06 DIAGNOSIS — D509 Iron deficiency anemia, unspecified: Secondary | ICD-10-CM | POA: Insufficient documentation

## 2013-01-06 LAB — POCT HEMOGLOBIN-HEMACUE: Hemoglobin: 10.3 g/dL — ABNORMAL LOW (ref 12.0–15.0)

## 2013-01-06 MED ORDER — CLONIDINE HCL 0.1 MG PO TABS: 0.1000 mg | ORAL_TABLET | ORAL | Status: DC | PRN

## 2013-01-06 MED ORDER — SODIUM CHLORIDE 0.9 % IV SOLN
1020.0000 mg | Freq: Once | INTRAVENOUS | Status: AC
  Administered 2013-01-06: 14:00:00 1020 mg via INTRAVENOUS
  Filled 2013-01-06: qty 34

## 2013-01-06 MED ORDER — EPOETIN ALFA 20000 UNIT/ML IJ SOLN
INTRAMUSCULAR | Status: AC
  Administered 2013-01-06: 20000 [IU] via SUBCUTANEOUS
  Filled 2013-01-06: qty 1

## 2013-01-06 MED ORDER — EPOETIN ALFA 40000 UNIT/ML IJ SOLN: 30000.0000 [IU] | INTRAMUSCULAR | Status: DC

## 2013-01-06 MED ORDER — EPOETIN ALFA 10000 UNIT/ML IJ SOLN
INTRAMUSCULAR | Status: AC
  Administered 2013-01-06: 10000 [IU] via SUBCUTANEOUS
  Filled 2013-01-06: qty 1

## 2013-01-06 MED ORDER — SODIUM CHLORIDE 0.9 % IV SOLN
INTRAVENOUS | Status: DC
  Administered 2013-01-06: 14:00:00 via INTRAVENOUS

## 2013-01-12 ENCOUNTER — Encounter (HOSPITAL_COMMUNITY): Payer: PRIVATE HEALTH INSURANCE

## 2013-01-13 ENCOUNTER — Encounter (HOSPITAL_COMMUNITY)
Admission: RE | Admit: 2013-01-13 | Discharge: 2013-01-13 | Disposition: A | Discharge status: Home / Self Care | Payer: PRIVATE HEALTH INSURANCE | Source: Ambulatory Visit | Attending: Nephrology | Admitting: Nephrology

## 2013-01-13 LAB — POCT HEMOGLOBIN-HEMACUE: Hemoglobin: 9.7 g/dL — ABNORMAL LOW (ref 12.0–15.0)

## 2013-01-13 MED ORDER — EPOETIN ALFA 10000 UNIT/ML IJ SOLN
INTRAMUSCULAR | Status: AC
  Administered 2013-01-13: 10000 [IU] via SUBCUTANEOUS
  Filled 2013-01-13: qty 1

## 2013-01-13 MED ORDER — EPOETIN ALFA 20000 UNIT/ML IJ SOLN
INTRAMUSCULAR | Status: AC
  Administered 2013-01-13: 14:00:00 20000 [IU] via SUBCUTANEOUS
  Filled 2013-01-13: qty 1

## 2013-01-13 MED ORDER — EPOETIN ALFA 40000 UNIT/ML IJ SOLN: 30000.0000 [IU] | INTRAMUSCULAR | Status: DC

## 2013-01-13 MED ORDER — CLONIDINE HCL 0.1 MG PO TABS: 0.1000 mg | ORAL_TABLET | ORAL | Status: DC | PRN

## 2013-01-18 ENCOUNTER — Ambulatory Visit: Payer: PRIVATE HEALTH INSURANCE

## 2013-01-20 ENCOUNTER — Encounter (HOSPITAL_COMMUNITY)
Admission: RE | Admit: 2013-01-20 | Discharge: 2013-01-20 | Disposition: A | Discharge status: Home / Self Care | Payer: PRIVATE HEALTH INSURANCE | Source: Ambulatory Visit | Attending: Nephrology | Admitting: Nephrology

## 2013-01-20 LAB — IRON AND TIBC
Iron: 50 ug/dL (ref 42–135)
Saturation Ratios: 25 % (ref 20–55)
TIBC: 203 ug/dL — ABNORMAL LOW (ref 250–470)
UIBC: 153 ug/dL (ref 125–400)

## 2013-01-20 LAB — POCT HEMOGLOBIN-HEMACUE: Hemoglobin: 10.2 g/dL — ABNORMAL LOW (ref 12.0–15.0)

## 2013-01-20 MED ORDER — CLONIDINE HCL 0.1 MG PO TABS: 0.1000 mg | ORAL_TABLET | ORAL | Status: DC | PRN

## 2013-01-20 MED ORDER — EPOETIN ALFA 20000 UNIT/ML IJ SOLN
INTRAMUSCULAR | Status: AC
  Administered 2013-01-20: 13:00:00 20000 [IU]
  Filled 2013-01-20: qty 1

## 2013-01-20 MED ORDER — EPOETIN ALFA 40000 UNIT/ML IJ SOLN: 30000.0000 [IU] | INTRAMUSCULAR | Status: DC

## 2013-01-20 MED ORDER — EPOETIN ALFA 10000 UNIT/ML IJ SOLN
INTRAMUSCULAR | Status: AC
  Administered 2013-01-20: 10000 [IU]
  Filled 2013-01-20: qty 1

## 2013-01-24 ENCOUNTER — Ambulatory Visit
Admission: RE | Admit: 2013-01-24 | Discharge: 2013-01-24 | Disposition: A | Discharge status: Home / Self Care | Payer: PRIVATE HEALTH INSURANCE | Source: Ambulatory Visit

## 2013-01-24 ENCOUNTER — Ambulatory Visit: Payer: PRIVATE HEALTH INSURANCE

## 2013-01-24 DIAGNOSIS — Z1231 Encounter for screening mammogram for malignant neoplasm of breast: Secondary | ICD-10-CM

## 2013-01-27 ENCOUNTER — Other Ambulatory Visit: Payer: Self-pay | Admitting: Internal Medicine

## 2013-02-03 ENCOUNTER — Encounter (HOSPITAL_COMMUNITY)
Admission: RE | Admit: 2013-02-03 | Discharge: 2013-02-03 | Disposition: A | Discharge status: Home / Self Care | Payer: PRIVATE HEALTH INSURANCE | Source: Ambulatory Visit | Attending: Nephrology | Admitting: Nephrology

## 2013-02-03 DIAGNOSIS — D509 Iron deficiency anemia, unspecified: Secondary | ICD-10-CM | POA: Insufficient documentation

## 2013-02-03 LAB — POCT HEMOGLOBIN-HEMACUE: Hemoglobin: 10.2 g/dL — ABNORMAL LOW (ref 12.0–15.0)

## 2013-02-03 MED ORDER — EPOETIN ALFA 40000 UNIT/ML IJ SOLN: 30000.0000 [IU] | INTRAMUSCULAR | Status: DC

## 2013-02-03 MED ORDER — EPOETIN ALFA 10000 UNIT/ML IJ SOLN
INTRAMUSCULAR | Status: AC
  Administered 2013-02-03: 12:00:00 10000 [IU]
  Filled 2013-02-03: qty 1

## 2013-02-03 MED ORDER — CLONIDINE HCL 0.1 MG PO TABS: 0.1000 mg | ORAL_TABLET | ORAL | Status: DC | PRN

## 2013-02-03 MED ORDER — EPOETIN ALFA 20000 UNIT/ML IJ SOLN
INTRAMUSCULAR | Status: AC
  Administered 2013-02-03: 20000 [IU]
  Filled 2013-02-03: qty 1

## 2013-02-10 ENCOUNTER — Encounter (HOSPITAL_COMMUNITY): Payer: PRIVATE HEALTH INSURANCE

## 2013-02-17 ENCOUNTER — Encounter (HOSPITAL_COMMUNITY)
Admission: RE | Admit: 2013-02-17 | Discharge: 2013-02-17 | Disposition: A | Discharge status: Home / Self Care | Payer: PRIVATE HEALTH INSURANCE | Source: Ambulatory Visit | Attending: Nephrology | Admitting: Nephrology

## 2013-02-17 DIAGNOSIS — D509 Iron deficiency anemia, unspecified: Secondary | ICD-10-CM | POA: Diagnosis not present

## 2013-02-17 LAB — IRON AND TIBC
Iron: 52 ug/dL (ref 42–135)
Saturation Ratios: 30 % (ref 20–55)
TIBC: 176 ug/dL — ABNORMAL LOW (ref 250–470)
UIBC: 124 ug/dL — ABNORMAL LOW (ref 125–400)

## 2013-02-17 LAB — POCT HEMOGLOBIN-HEMACUE: HEMOGLOBIN: 10.3 g/dL — AB (ref 12.0–15.0)

## 2013-02-17 LAB — FERRITIN: Ferritin: 858 ng/mL — ABNORMAL HIGH (ref 10–291)

## 2013-02-17 MED ORDER — EPOETIN ALFA 10000 UNIT/ML IJ SOLN
INTRAMUSCULAR | Status: AC
  Administered 2013-02-17: 13:00:00 10000 [IU] via SUBCUTANEOUS
  Filled 2013-02-17: qty 1

## 2013-02-17 MED ORDER — EPOETIN ALFA 20000 UNIT/ML IJ SOLN
INTRAMUSCULAR | Status: AC
  Administered 2013-02-17: 20000 [IU] via SUBCUTANEOUS
  Filled 2013-02-17: qty 1

## 2013-02-17 MED ORDER — EPOETIN ALFA 40000 UNIT/ML IJ SOLN: 30000.0000 [IU] | INTRAMUSCULAR | Status: DC

## 2013-02-23 ENCOUNTER — Other Ambulatory Visit: Payer: Self-pay | Admitting: Internal Medicine

## 2013-02-24 ENCOUNTER — Encounter (HOSPITAL_COMMUNITY): Payer: PRIVATE HEALTH INSURANCE

## 2013-02-24 ENCOUNTER — Other Ambulatory Visit: Payer: Self-pay | Admitting: *Deleted

## 2013-02-24 DIAGNOSIS — I10 Essential (primary) hypertension: Secondary | ICD-10-CM

## 2013-02-24 MED ORDER — ATENOLOL 50 MG PO TABS: 50.0000 mg | ORAL_TABLET | Freq: Two times a day (BID) | ORAL | Status: DC

## 2013-02-28 ENCOUNTER — Other Ambulatory Visit (HOSPITAL_COMMUNITY): Payer: Self-pay | Admitting: *Deleted

## 2013-03-01 ENCOUNTER — Encounter (HOSPITAL_COMMUNITY)
Admission: RE | Admit: 2013-03-01 | Discharge: 2013-03-01 | Disposition: A | Discharge status: Home / Self Care | Payer: PRIVATE HEALTH INSURANCE | Source: Ambulatory Visit | Attending: Nephrology | Admitting: Nephrology

## 2013-03-01 DIAGNOSIS — D509 Iron deficiency anemia, unspecified: Secondary | ICD-10-CM | POA: Diagnosis not present

## 2013-03-01 LAB — POCT HEMOGLOBIN-HEMACUE: HEMOGLOBIN: 10.2 g/dL — AB (ref 12.0–15.0)

## 2013-03-01 MED ORDER — EPOETIN ALFA 10000 UNIT/ML IJ SOLN
INTRAMUSCULAR | Status: AC
  Administered 2013-03-01: 10000 [IU]
  Filled 2013-03-01: qty 1

## 2013-03-01 MED ORDER — EPOETIN ALFA 40000 UNIT/ML IJ SOLN: 30000.0000 [IU] | INTRAMUSCULAR | Status: DC

## 2013-03-01 MED ORDER — EPOETIN ALFA 20000 UNIT/ML IJ SOLN
INTRAMUSCULAR | Status: AC
  Administered 2013-03-01: 20000 [IU]
  Filled 2013-03-01: qty 1

## 2013-03-01 MED ORDER — CLONIDINE HCL 0.1 MG PO TABS: 0.1000 mg | ORAL_TABLET | ORAL | Status: DC | PRN

## 2013-03-08 ENCOUNTER — Encounter (HOSPITAL_COMMUNITY)
Admission: RE | Admit: 2013-03-08 | Discharge: 2013-03-08 | Disposition: A | Discharge status: Home / Self Care | Payer: PRIVATE HEALTH INSURANCE | Source: Ambulatory Visit | Attending: Nephrology | Admitting: Nephrology

## 2013-03-08 DIAGNOSIS — D509 Iron deficiency anemia, unspecified: Secondary | ICD-10-CM | POA: Insufficient documentation

## 2013-03-08 MED ORDER — EPOETIN ALFA 10000 UNIT/ML IJ SOLN
INTRAMUSCULAR | Status: AC
  Administered 2013-03-08: 10000 [IU]
  Filled 2013-03-08: qty 1

## 2013-03-08 MED ORDER — EPOETIN ALFA 20000 UNIT/ML IJ SOLN
INTRAMUSCULAR | Status: AC
  Administered 2013-03-08: 20000 [IU]
  Filled 2013-03-08: qty 1

## 2013-03-08 MED ORDER — CLONIDINE HCL 0.1 MG PO TABS: 0.1000 mg | ORAL_TABLET | ORAL | Status: DC | PRN

## 2013-03-08 MED ORDER — EPOETIN ALFA 40000 UNIT/ML IJ SOLN: 30000.0000 [IU] | INTRAMUSCULAR | Status: DC

## 2013-03-09 LAB — POCT HEMOGLOBIN-HEMACUE: HEMOGLOBIN: 10.6 g/dL — AB (ref 12.0–15.0)

## 2013-03-15 ENCOUNTER — Encounter (HOSPITAL_COMMUNITY)
Admission: RE | Admit: 2013-03-15 | Discharge: 2013-03-15 | Disposition: A | Discharge status: Home / Self Care | Payer: PRIVATE HEALTH INSURANCE | Source: Ambulatory Visit | Attending: Rehabilitation | Admitting: Rehabilitation

## 2013-03-15 LAB — POCT HEMOGLOBIN-HEMACUE: Hemoglobin: 10.7 g/dL — ABNORMAL LOW (ref 12.0–15.0)

## 2013-03-15 LAB — IRON AND TIBC
IRON: 37 ug/dL — AB (ref 42–135)
Saturation Ratios: 19 % — ABNORMAL LOW (ref 20–55)
TIBC: 190 ug/dL — AB (ref 250–470)
UIBC: 153 ug/dL (ref 125–400)

## 2013-03-15 LAB — FERRITIN: Ferritin: 734 ng/mL — ABNORMAL HIGH (ref 10–291)

## 2013-03-15 MED ORDER — EPOETIN ALFA 40000 UNIT/ML IJ SOLN: 30000.0000 [IU] | INTRAMUSCULAR | Status: DC

## 2013-03-15 MED ORDER — EPOETIN ALFA 20000 UNIT/ML IJ SOLN
INTRAMUSCULAR | Status: AC
  Administered 2013-03-15: 20000 [IU] via SUBCUTANEOUS
  Filled 2013-03-15: qty 1

## 2013-03-15 MED ORDER — EPOETIN ALFA 10000 UNIT/ML IJ SOLN
INTRAMUSCULAR | Status: AC
  Administered 2013-03-15: 10000 [IU] via SUBCUTANEOUS
  Filled 2013-03-15: qty 1

## 2013-03-23 ENCOUNTER — Encounter (HOSPITAL_COMMUNITY): Payer: PRIVATE HEALTH INSURANCE

## 2013-03-30 ENCOUNTER — Inpatient Hospital Stay (HOSPITAL_COMMUNITY): Admission: RE | Admit: 2013-03-30 | Payer: PRIVATE HEALTH INSURANCE | Source: Ambulatory Visit

## 2013-03-31 ENCOUNTER — Other Ambulatory Visit: Payer: Self-pay | Admitting: Internal Medicine

## 2013-04-03 ENCOUNTER — Other Ambulatory Visit: Payer: Self-pay | Admitting: Internal Medicine

## 2013-04-04 NOTE — Telephone Encounter (Signed)
Called to pharm 

## 2013-04-04 NOTE — Telephone Encounter (Signed)
Tylenol #3 refill approved; nurse to call in.

## 2013-04-06 ENCOUNTER — Other Ambulatory Visit: Payer: Self-pay | Admitting: *Deleted

## 2013-04-06 MED ORDER — ENALAPRIL MALEATE 20 MG PO TABS: 20.0000 mg | ORAL_TABLET | Freq: Two times a day (BID) | ORAL | Status: DC

## 2013-04-06 NOTE — Telephone Encounter (Signed)
I changed to 20 mg tablets as requested.

## 2013-04-06 NOTE — Telephone Encounter (Signed)
Pt's insurance will only pay a maximum of 2 tabs per day for pt's enalapril.   I called pharmacy and they carry Enalapril 20 mg tabs so If you will send in a new Rx for Enalapril 20 mg one pill twice a day she can get at reasonable cost.

## 2013-04-07 ENCOUNTER — Encounter (HOSPITAL_COMMUNITY)
Admission: RE | Admit: 2013-04-07 | Discharge: 2013-04-07 | Disposition: A | Discharge status: Home / Self Care | Payer: PRIVATE HEALTH INSURANCE | Source: Ambulatory Visit | Attending: Nephrology | Admitting: Nephrology

## 2013-04-07 DIAGNOSIS — D509 Iron deficiency anemia, unspecified: Secondary | ICD-10-CM | POA: Insufficient documentation

## 2013-04-07 MED ORDER — CLONIDINE HCL 0.1 MG PO TABS: 0.1000 mg | ORAL_TABLET | ORAL | Status: DC | PRN

## 2013-04-07 MED ORDER — EPOETIN ALFA 40000 UNIT/ML IJ SOLN: 30000.0000 [IU] | INTRAMUSCULAR | Status: DC

## 2013-04-10 LAB — POCT HEMOGLOBIN-HEMACUE: Hemoglobin: 11.5 g/dL — ABNORMAL LOW (ref 12.0–15.0)

## 2013-04-21 ENCOUNTER — Encounter (HOSPITAL_COMMUNITY)
Admission: RE | Admit: 2013-04-21 | Discharge: 2013-04-21 | Disposition: A | Discharge status: Home / Self Care | Payer: PRIVATE HEALTH INSURANCE | Source: Ambulatory Visit | Attending: Nephrology | Admitting: Nephrology

## 2013-04-21 LAB — FERRITIN: FERRITIN: 916 ng/mL — AB (ref 10–291)

## 2013-04-21 LAB — POCT HEMOGLOBIN-HEMACUE: Hemoglobin: 9.6 g/dL — ABNORMAL LOW (ref 12.0–15.0)

## 2013-04-21 LAB — IRON AND TIBC
IRON: 115 ug/dL (ref 42–135)
SATURATION RATIOS: 58 % — AB (ref 20–55)
TIBC: 197 ug/dL — ABNORMAL LOW (ref 250–470)
UIBC: 82 ug/dL — AB (ref 125–400)

## 2013-04-21 MED ORDER — CLONIDINE HCL 0.1 MG PO TABS: 0.1000 mg | ORAL_TABLET | ORAL | Status: DC | PRN

## 2013-04-21 MED ORDER — EPOETIN ALFA 40000 UNIT/ML IJ SOLN: 30000.0000 [IU] | INTRAMUSCULAR | Status: DC

## 2013-04-21 MED ORDER — EPOETIN ALFA 20000 UNIT/ML IJ SOLN
INTRAMUSCULAR | Status: AC
  Administered 2013-04-21: 20000 [IU] via SUBCUTANEOUS
  Filled 2013-04-21: qty 1

## 2013-04-21 MED ORDER — EPOETIN ALFA 10000 UNIT/ML IJ SOLN
INTRAMUSCULAR | Status: AC
  Administered 2013-04-21: 10000 [IU] via SUBCUTANEOUS
  Filled 2013-04-21: qty 1

## 2013-04-26 ENCOUNTER — Ambulatory Visit (INDEPENDENT_AMBULATORY_CARE_PROVIDER_SITE_OTHER): Payer: PRIVATE HEALTH INSURANCE | Admitting: Internal Medicine

## 2013-04-26 ENCOUNTER — Ambulatory Visit (HOSPITAL_COMMUNITY)
Admission: RE | Admit: 2013-04-26 | Discharge: 2013-04-26 | Disposition: A | Discharge status: Home / Self Care | Payer: PRIVATE HEALTH INSURANCE | Source: Ambulatory Visit | Attending: Internal Medicine | Admitting: Internal Medicine

## 2013-04-26 ENCOUNTER — Encounter: Payer: Self-pay | Admitting: Internal Medicine

## 2013-04-26 VITALS — BP 140/70 | HR 89 | Temp 97.0°F | Ht 63.0 in | Wt 260.5 lb

## 2013-04-26 DIAGNOSIS — M79609 Pain in unspecified limb: Secondary | ICD-10-CM

## 2013-04-26 DIAGNOSIS — M7989 Other specified soft tissue disorders: Secondary | ICD-10-CM

## 2013-04-26 DIAGNOSIS — M109 Gout, unspecified: Secondary | ICD-10-CM | POA: Insufficient documentation

## 2013-04-26 DIAGNOSIS — E119 Type 2 diabetes mellitus without complications: Secondary | ICD-10-CM

## 2013-04-26 DIAGNOSIS — R600 Localized edema: Secondary | ICD-10-CM | POA: Insufficient documentation

## 2013-04-26 DIAGNOSIS — E1149 Type 2 diabetes mellitus with other diabetic neurological complication: Secondary | ICD-10-CM

## 2013-04-26 DIAGNOSIS — N189 Chronic kidney disease, unspecified: Secondary | ICD-10-CM

## 2013-04-26 DIAGNOSIS — I10 Essential (primary) hypertension: Secondary | ICD-10-CM

## 2013-04-26 LAB — COMPLETE METABOLIC PANEL WITHOUT GFR
ALT: 14 U/L (ref 0–35)
AST: 16 U/L (ref 0–37)
Albumin: 3.8 g/dL (ref 3.5–5.2)
Alkaline Phosphatase: 77 U/L (ref 39–117)
BUN: 24 mg/dL — ABNORMAL HIGH (ref 6–23)
CO2: 26 meq/L (ref 19–32)
Calcium: 10 mg/dL (ref 8.4–10.5)
Chloride: 106 meq/L (ref 96–112)
Creat: 1.58 mg/dL — ABNORMAL HIGH (ref 0.50–1.10)
GFR, Est African American: 38 mL/min — ABNORMAL LOW
GFR, Est Non African American: 33 mL/min — ABNORMAL LOW
Glucose, Bld: 222 mg/dL — ABNORMAL HIGH (ref 70–99)
Potassium: 4.2 meq/L (ref 3.5–5.3)
Sodium: 139 meq/L (ref 135–145)
Total Bilirubin: 0.3 mg/dL (ref 0.2–1.2)
Total Protein: 7.2 g/dL (ref 6.0–8.3)

## 2013-04-26 LAB — CBC WITH DIFFERENTIAL/PLATELET
Basophils Absolute: 0 10*3/uL (ref 0.0–0.1)
Basophils Relative: 0 % (ref 0–1)
EOS ABS: 0.2 10*3/uL (ref 0.0–0.7)
Eosinophils Relative: 2 % (ref 0–5)
HCT: 33.4 % — ABNORMAL LOW (ref 36.0–46.0)
HEMOGLOBIN: 10.4 g/dL — AB (ref 12.0–15.0)
LYMPHS ABS: 2.2 10*3/uL (ref 0.7–4.0)
Lymphocytes Relative: 23 % (ref 12–46)
MCH: 23.2 pg — AB (ref 26.0–34.0)
MCHC: 31.1 g/dL (ref 30.0–36.0)
MCV: 74.4 fL — ABNORMAL LOW (ref 78.0–100.0)
MONOS PCT: 8 % (ref 3–12)
Monocytes Absolute: 0.8 10*3/uL (ref 0.1–1.0)
NEUTROS ABS: 6.5 10*3/uL (ref 1.7–7.7)
NEUTROS PCT: 67 % (ref 43–77)
PLATELETS: 233 10*3/uL (ref 150–400)
RBC: 4.49 MIL/uL (ref 3.87–5.11)
RDW: 16.3 % — ABNORMAL HIGH (ref 11.5–15.5)
WBC: 9.7 10*3/uL (ref 4.0–10.5)

## 2013-04-26 LAB — POCT GLYCOSYLATED HEMOGLOBIN (HGB A1C): Hemoglobin A1C: 7.2

## 2013-04-26 LAB — GLUCOSE, CAPILLARY: Glucose-Capillary: 190 mg/dL — ABNORMAL HIGH (ref 70–99)

## 2013-04-26 MED ORDER — DESIPRAMINE HCL 10 MG PO TABS: 10.0000 mg | ORAL_TABLET | Freq: Every day | ORAL | Status: DC

## 2013-04-26 NOTE — Assessment & Plan Note (Signed)
Assessment: Patient has mild left leg edema, with 2+ right leg edema; lower extremity venous duplex study today was negative for DVT.  The cause of her edema is unclear; there is no evidence of cellulitis, and no evidence of acute inflammation to suggest gout.  This may represent venous insufficiency.  Patient says it has occurred in the past and resolved with elevation.  Plan: I advised patient to increase her furosemide 40 mg to a dose of 2 tablets daily for 2-3 days, and then resume one tablet daily.  I advised her to elevate her leg when she is resting at home during the day, and to try a compression stocking to see if this helps.  I advised her to call or return if the edema worsens prior to her next scheduled visit.

## 2013-04-26 NOTE — Progress Notes (Addendum)
   Subjective:    Patient ID: Joan Banks, female    DOB: 1945-11-28, 68 y.o.   MRN: [id number]  HPI Patient returns for management of her diabetes mellitus, hypertension, chronic renal insufficiency, and other chronic medical problems.  Her main complaint today is asymmetric leg swelling for about 1 week, right greater than left, and pain in the anterior aspect of her right foot.  She has had occasional right leg swelling and right foot pain in the past.  She also reports a tingling discomfort in both feet.  She has no other acute complaints today.  She reports that she is compliant with her medications.  Her blood sugars at home over the past month have ranged from a low of 71 to a high of 272, with an average value of 152.     Review of Systems  Constitutional: Negative for fever and chills.  Respiratory: Negative for shortness of breath.   Cardiovascular: Positive for leg swelling. Negative for chest pain.  Gastrointestinal: Negative for nausea, vomiting and abdominal pain.  Genitourinary: Negative for dysuria and frequency.       Objective:   Physical Exam  Constitutional: No distress.  Cardiovascular: Normal rate and regular rhythm.  Exam reveals no gallop and no friction rub.   No murmur heard. Pulmonary/Chest: Effort normal and breath sounds normal. No respiratory distress. She has no wheezes. She has no rales.  Abdominal: Soft. Bowel sounds are normal. She exhibits no distension. There is no tenderness. There is no rebound and no guarding.  Musculoskeletal: She exhibits edema (2+ edema of right leg and foot; trace left leg edema).       Feet:          Assessment & Plan:

## 2013-04-26 NOTE — Patient Instructions (Signed)
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 Increase furosemide 40 mg to a dose of 2 tablets daily for 2-3 days, then resume previous dose of one tablet daily. Elevate right leg when resting during the day. Call or return if her pain worsens or persists.

## 2013-04-26 NOTE — Assessment & Plan Note (Signed)
Lab Results  Component Value Date   HGBA1C 7.2 04/26/2013   HGBA1C 7.1 01/04/2013   HGBA1C 6.3 06/22/2012     Assessment: Diabetes control: fair control Progress toward A1C goal:  unchanged Comments: Hemoglobin A1c is stable and near goal on Lantus Solostar insulin 45 units daily at bedtime, glipizide XL 20 mg daily, and Januvia 50 mg daily.  Plan: Medications:  continue current medications Home glucose monitoring: Frequency: 2 times a day Timing: before breakfast;before dinner Instruction/counseling given: discussed foot care

## 2013-04-26 NOTE — Progress Notes (Signed)
VASCULAR LAB PRELIMINARY  PRELIMINARY  PRELIMINARY  PRELIMINARY  Right lower extremity venous duplex completed.    Preliminary report:  Right:  No evidence of DVT, superficial thrombosis, or Baker's cyst.  Casondra Gasca, RVS 04/26/2013, 1:38 PM

## 2013-04-26 NOTE — Assessment & Plan Note (Signed)
BP Readings from Last 3 Encounters:  04/26/13 140/70  04/21/13 143/64  04/07/13 172/74    Lab Results  Component Value Date   NA 143 01/04/2013   K 4.0 01/04/2013   CREATININE 1.84* 01/04/2013    Assessment: Blood pressure control: controlled Progress toward BP goal:  at goal Comments: Blood pressure is at goal on amlodipine 10 mg daily, atenolol 50 mg twice a day, clonidine 0.3 mg twice a day, enalapril 20 mg twice a day, and furosemide 40 mg daily.  Plan: Medications:  continue current medications

## 2013-04-26 NOTE — Assessment & Plan Note (Signed)
Assessment: Patient reports bilateral tingling pain in both feet that radiates into her legs; her symptoms are consistent with diabetic neuropathy.  Plan: Start desipramine 10 mg daily at bedtime.  I discussed potential side effects with patient, and advised her to call if she develops any apparent side effects of the medication.

## 2013-04-26 NOTE — Assessment & Plan Note (Signed)
Lab Results  Component Value Date   CREATININE 1.84* 01/04/2013   CREATININE 1.64* 09/07/2012   CREATININE 1.76* 06/22/2012   CREATININE 1.55* 03/31/2012   CREATININE 1.72* 01/20/2012   CREATININE 1.57* 09/24/2011     Assessment: Patient is followed by nephrologist Dr. Posey Pronto.  Her creatinine is reasonably stable.  Plan: Patient will followup with Dr. Posey Pronto.

## 2013-04-27 ENCOUNTER — Encounter (HOSPITAL_COMMUNITY)
Admission: RE | Admit: 2013-04-27 | Discharge: 2013-04-27 | Disposition: A | Discharge status: Home / Self Care | Payer: PRIVATE HEALTH INSURANCE | Source: Ambulatory Visit | Attending: Nephrology | Admitting: Nephrology

## 2013-04-27 LAB — POCT HEMOGLOBIN-HEMACUE: HEMOGLOBIN: 11.1 g/dL — AB (ref 12.0–15.0)

## 2013-04-27 MED ORDER — EPOETIN ALFA 20000 UNIT/ML IJ SOLN
INTRAMUSCULAR | Status: AC
  Administered 2013-04-27: 30000 [IU] via SUBCUTANEOUS
  Filled 2013-04-27: qty 1

## 2013-04-27 MED ORDER — EPOETIN ALFA 10000 UNIT/ML IJ SOLN
INTRAMUSCULAR | Status: AC
  Filled 2013-04-27: qty 1

## 2013-04-27 MED ORDER — CLONIDINE HCL 0.1 MG PO TABS: 0.1000 mg | ORAL_TABLET | ORAL | Status: DC | PRN

## 2013-04-27 MED ORDER — EPOETIN ALFA 40000 UNIT/ML IJ SOLN: 30000.0000 [IU] | INTRAMUSCULAR | Status: DC

## 2013-04-28 ENCOUNTER — Other Ambulatory Visit: Payer: Self-pay | Admitting: Internal Medicine

## 2013-04-28 MED FILL — Epoetin Alfa Inj 20000 Unit/ML: INTRAMUSCULAR | Qty: 1 | Status: AC

## 2013-04-28 MED FILL — Epoetin Alfa Inj 10000 Unit/ML: INTRAMUSCULAR | Qty: 1 | Status: AC

## 2013-05-02 ENCOUNTER — Other Ambulatory Visit: Payer: Self-pay | Admitting: Internal Medicine

## 2013-05-05 ENCOUNTER — Encounter (HOSPITAL_COMMUNITY)
Admission: RE | Admit: 2013-05-05 | Discharge: 2013-05-05 | Disposition: A | Discharge status: Home / Self Care | Payer: PRIVATE HEALTH INSURANCE | Source: Ambulatory Visit | Attending: Nephrology | Admitting: Nephrology

## 2013-05-05 DIAGNOSIS — D509 Iron deficiency anemia, unspecified: Secondary | ICD-10-CM | POA: Insufficient documentation

## 2013-05-05 LAB — POCT HEMOGLOBIN-HEMACUE: HEMOGLOBIN: 10.8 g/dL — AB (ref 12.0–15.0)

## 2013-05-05 MED ORDER — EPOETIN ALFA 40000 UNIT/ML IJ SOLN: 30000.0000 [IU] | INTRAMUSCULAR | Status: DC

## 2013-05-05 MED ORDER — EPOETIN ALFA 10000 UNIT/ML IJ SOLN
INTRAMUSCULAR | Status: AC
  Administered 2013-05-05: 10000 [IU]
  Filled 2013-05-05: qty 1

## 2013-05-05 MED ORDER — EPOETIN ALFA 20000 UNIT/ML IJ SOLN
INTRAMUSCULAR | Status: AC
  Administered 2013-05-05: 20000 [IU]
  Filled 2013-05-05: qty 1

## 2013-05-12 ENCOUNTER — Encounter (HOSPITAL_COMMUNITY)
Admission: RE | Admit: 2013-05-12 | Discharge: 2013-05-12 | Disposition: A | Discharge status: Home / Self Care | Payer: PRIVATE HEALTH INSURANCE | Source: Ambulatory Visit | Attending: Rehabilitation | Admitting: Rehabilitation

## 2013-05-12 LAB — POCT HEMOGLOBIN-HEMACUE: Hemoglobin: 10.8 g/dL — ABNORMAL LOW (ref 12.0–15.0)

## 2013-05-12 MED ORDER — EPOETIN ALFA 10000 UNIT/ML IJ SOLN
INTRAMUSCULAR | Status: AC
  Administered 2013-05-12: 10000 [IU] via SUBCUTANEOUS
  Filled 2013-05-12: qty 1

## 2013-05-12 MED ORDER — EPOETIN ALFA 20000 UNIT/ML IJ SOLN
INTRAMUSCULAR | Status: AC
  Administered 2013-05-12: 20000 [IU] via SUBCUTANEOUS
  Filled 2013-05-12: qty 1

## 2013-05-12 MED ORDER — CLONIDINE HCL 0.1 MG PO TABS: 0.1000 mg | ORAL_TABLET | ORAL | Status: DC | PRN

## 2013-05-12 MED ORDER — EPOETIN ALFA 40000 UNIT/ML IJ SOLN: 30000.0000 [IU] | INTRAMUSCULAR | Status: DC

## 2013-05-18 ENCOUNTER — Encounter (HOSPITAL_COMMUNITY)
Admission: RE | Admit: 2013-05-18 | Discharge: 2013-05-18 | Disposition: A | Discharge status: Home / Self Care | Payer: PRIVATE HEALTH INSURANCE | Source: Ambulatory Visit | Attending: Nephrology | Admitting: Nephrology

## 2013-05-18 LAB — POCT HEMOGLOBIN-HEMACUE: Hemoglobin: 11.4 g/dL — ABNORMAL LOW (ref 12.0–15.0)

## 2013-05-18 LAB — IRON AND TIBC
IRON: 50 ug/dL (ref 42–135)
SATURATION RATIOS: 24 % (ref 20–55)
TIBC: 207 ug/dL — AB (ref 250–470)
UIBC: 157 ug/dL (ref 125–400)

## 2013-05-18 LAB — FERRITIN: Ferritin: 740 ng/mL — ABNORMAL HIGH (ref 10–291)

## 2013-05-18 MED ORDER — EPOETIN ALFA 40000 UNIT/ML IJ SOLN: 30000.0000 [IU] | INTRAMUSCULAR | Status: DC

## 2013-05-18 MED ORDER — EPOETIN ALFA 10000 UNIT/ML IJ SOLN
INTRAMUSCULAR | Status: AC
  Administered 2013-05-18: 10000 [IU] via SUBCUTANEOUS
  Filled 2013-05-18: qty 1

## 2013-05-18 MED ORDER — EPOETIN ALFA 20000 UNIT/ML IJ SOLN
INTRAMUSCULAR | Status: AC
  Administered 2013-05-18: 20000 [IU] via SUBCUTANEOUS
  Filled 2013-05-18: qty 1

## 2013-05-26 ENCOUNTER — Encounter (HOSPITAL_COMMUNITY)
Admission: RE | Admit: 2013-05-26 | Discharge: 2013-05-26 | Disposition: A | Discharge status: Home / Self Care | Payer: PRIVATE HEALTH INSURANCE | Source: Ambulatory Visit | Attending: Nephrology | Admitting: Nephrology

## 2013-05-26 ENCOUNTER — Ambulatory Visit: Payer: PRIVATE HEALTH INSURANCE | Admitting: Internal Medicine

## 2013-05-26 LAB — POCT HEMOGLOBIN-HEMACUE: HEMOGLOBIN: 10.6 g/dL — AB (ref 12.0–15.0)

## 2013-05-26 MED ORDER — CLONIDINE HCL 0.1 MG PO TABS: 0.1000 mg | ORAL_TABLET | ORAL | Status: DC | PRN

## 2013-05-26 MED ORDER — EPOETIN ALFA 40000 UNIT/ML IJ SOLN: 30000.0000 [IU] | INTRAMUSCULAR | Status: DC

## 2013-05-26 MED ORDER — EPOETIN ALFA 10000 UNIT/ML IJ SOLN
INTRAMUSCULAR | Status: AC
  Administered 2013-05-26: 10000 [IU] via SUBCUTANEOUS
  Filled 2013-05-26: qty 1

## 2013-05-26 MED ORDER — EPOETIN ALFA 20000 UNIT/ML IJ SOLN
INTRAMUSCULAR | Status: AC
  Administered 2013-05-26: 20000 [IU] via SUBCUTANEOUS
  Filled 2013-05-26: qty 1

## 2013-06-09 ENCOUNTER — Encounter (HOSPITAL_COMMUNITY)
Admission: RE | Admit: 2013-06-09 | Discharge: 2013-06-09 | Disposition: A | Discharge status: Home / Self Care | Payer: PRIVATE HEALTH INSURANCE | Source: Ambulatory Visit | Attending: Nephrology | Admitting: Nephrology

## 2013-06-09 DIAGNOSIS — N184 Chronic kidney disease, stage 4 (severe): Secondary | ICD-10-CM | POA: Diagnosis not present

## 2013-06-09 DIAGNOSIS — D638 Anemia in other chronic diseases classified elsewhere: Secondary | ICD-10-CM | POA: Insufficient documentation

## 2013-06-09 LAB — RENAL FUNCTION PANEL
ALBUMIN: 3.3 g/dL — AB (ref 3.5–5.2)
BUN: 24 mg/dL — ABNORMAL HIGH (ref 6–23)
CALCIUM: 9.8 mg/dL (ref 8.4–10.5)
CO2: 22 mEq/L (ref 19–32)
Chloride: 109 mEq/L (ref 96–112)
Creatinine, Ser: 1.49 mg/dL — ABNORMAL HIGH (ref 0.50–1.10)
GFR calc non Af Amer: 35 mL/min — ABNORMAL LOW (ref >90)
GFR, EST AFRICAN AMERICAN: 40 mL/min — AB (ref >90)
Glucose, Bld: 107 mg/dL — ABNORMAL HIGH (ref 70–99)
POTASSIUM: 4.6 meq/L (ref 3.7–5.3)
Phosphorus: 2.3 mg/dL (ref 2.3–4.6)
SODIUM: 145 meq/L (ref 137–147)

## 2013-06-09 LAB — MAGNESIUM: MAGNESIUM: 1.6 mg/dL (ref 1.5–2.5)

## 2013-06-09 LAB — POCT HEMOGLOBIN-HEMACUE: HEMOGLOBIN: 10.9 g/dL — AB (ref 12.0–15.0)

## 2013-06-09 MED ORDER — EPOETIN ALFA 40000 UNIT/ML IJ SOLN
30000.0000 [IU] | INTRAMUSCULAR | Status: DC
  Administered 2013-06-09: 30000 [IU] via SUBCUTANEOUS

## 2013-06-09 MED ORDER — CLONIDINE HCL 0.1 MG PO TABS: 0.1000 mg | ORAL_TABLET | ORAL | Status: DC | PRN

## 2013-06-09 MED ORDER — EPOETIN ALFA 10000 UNIT/ML IJ SOLN
INTRAMUSCULAR | Status: AC
  Filled 2013-06-09: qty 1

## 2013-06-09 MED ORDER — EPOETIN ALFA 20000 UNIT/ML IJ SOLN
INTRAMUSCULAR | Status: AC
  Filled 2013-06-09: qty 1

## 2013-06-10 LAB — VITAMIN D 25 HYDROXY (VIT D DEFICIENCY, FRACTURES): Vit D, 25-Hydroxy: 17 ng/mL — ABNORMAL LOW (ref 30–89)

## 2013-06-12 LAB — PTH, INTACT AND CALCIUM
CALCIUM TOTAL (PTH): 9.9 mg/dL (ref 8.4–10.5)
PTH: 196.7 pg/mL — ABNORMAL HIGH (ref 14.0–72.0)

## 2013-06-12 MED FILL — Epoetin Alfa Inj 20000 Unit/ML: INTRAMUSCULAR | Qty: 1 | Status: AC

## 2013-06-12 MED FILL — Epoetin Alfa Inj 10000 Unit/ML: INTRAMUSCULAR | Qty: 1 | Status: AC

## 2013-06-15 ENCOUNTER — Other Ambulatory Visit (HOSPITAL_COMMUNITY): Payer: Self-pay

## 2013-06-16 ENCOUNTER — Encounter (HOSPITAL_COMMUNITY)
Admission: RE | Admit: 2013-06-16 | Discharge: 2013-06-16 | Disposition: A | Discharge status: Home / Self Care | Payer: PRIVATE HEALTH INSURANCE | Source: Ambulatory Visit | Attending: Nephrology | Admitting: Nephrology

## 2013-06-16 DIAGNOSIS — D638 Anemia in other chronic diseases classified elsewhere: Secondary | ICD-10-CM | POA: Diagnosis not present

## 2013-06-16 LAB — FERRITIN: Ferritin: 700 ng/mL — ABNORMAL HIGH (ref 10–291)

## 2013-06-16 LAB — POCT HEMOGLOBIN-HEMACUE: HEMOGLOBIN: 11 g/dL — AB (ref 12.0–15.0)

## 2013-06-16 LAB — IRON AND TIBC
Iron: 62 ug/dL (ref 42–135)
SATURATION RATIOS: 28 % (ref 20–55)
TIBC: 224 ug/dL — AB (ref 250–470)
UIBC: 162 ug/dL (ref 125–400)

## 2013-06-16 MED ORDER — EPOETIN ALFA 10000 UNIT/ML IJ SOLN
INTRAMUSCULAR | Status: AC
  Administered 2013-06-16: 10000 [IU] via SUBCUTANEOUS
  Filled 2013-06-16: qty 1

## 2013-06-16 MED ORDER — EPOETIN ALFA 20000 UNIT/ML IJ SOLN
INTRAMUSCULAR | Status: AC
  Administered 2013-06-16: 20000 [IU] via SUBCUTANEOUS
  Filled 2013-06-16: qty 1

## 2013-06-16 MED ORDER — EPOETIN ALFA 40000 UNIT/ML IJ SOLN: 30000.0000 [IU] | INTRAMUSCULAR | Status: DC

## 2013-06-16 MED ORDER — CLONIDINE HCL 0.1 MG PO TABS: 0.1000 mg | ORAL_TABLET | ORAL | Status: DC | PRN

## 2013-06-23 ENCOUNTER — Encounter (HOSPITAL_COMMUNITY)
Admission: RE | Admit: 2013-06-23 | Discharge: 2013-06-23 | Disposition: A | Discharge status: Home / Self Care | Payer: PRIVATE HEALTH INSURANCE | Source: Ambulatory Visit | Attending: Nephrology | Admitting: Nephrology

## 2013-06-23 DIAGNOSIS — D638 Anemia in other chronic diseases classified elsewhere: Secondary | ICD-10-CM | POA: Diagnosis not present

## 2013-06-23 LAB — POCT HEMOGLOBIN-HEMACUE: HEMOGLOBIN: 10.3 g/dL — AB (ref 12.0–15.0)

## 2013-06-23 MED ORDER — EPOETIN ALFA 20000 UNIT/ML IJ SOLN
INTRAMUSCULAR | Status: AC
  Administered 2013-06-23: 20000 [IU]
  Filled 2013-06-23: qty 1

## 2013-06-23 MED ORDER — EPOETIN ALFA 10000 UNIT/ML IJ SOLN
INTRAMUSCULAR | Status: AC
  Administered 2013-06-23: 10000 [IU]
  Filled 2013-06-23: qty 1

## 2013-06-23 MED ORDER — EPOETIN ALFA 40000 UNIT/ML IJ SOLN: 30000.0000 [IU] | INTRAMUSCULAR | Status: DC

## 2013-06-23 MED ORDER — CLONIDINE HCL 0.1 MG PO TABS: 0.1000 mg | ORAL_TABLET | ORAL | Status: DC | PRN

## 2013-06-30 ENCOUNTER — Other Ambulatory Visit: Payer: Self-pay | Admitting: Internal Medicine

## 2013-06-30 NOTE — Telephone Encounter (Signed)
Tylenol #3 rx called to [facility]. 

## 2013-06-30 NOTE — Telephone Encounter (Signed)
Refills approved - nurse to call in the acetaminophen-codeine refill.

## 2013-07-06 ENCOUNTER — Other Ambulatory Visit (HOSPITAL_COMMUNITY): Payer: Self-pay | Admitting: *Deleted

## 2013-07-07 ENCOUNTER — Inpatient Hospital Stay (HOSPITAL_COMMUNITY): Admission: RE | Admit: 2013-07-07 | Payer: PRIVATE HEALTH INSURANCE | Source: Ambulatory Visit

## 2013-07-14 ENCOUNTER — Encounter (HOSPITAL_COMMUNITY)
Admission: RE | Admit: 2013-07-14 | Discharge: 2013-07-14 | Disposition: A | Discharge status: Home / Self Care | Payer: PRIVATE HEALTH INSURANCE | Source: Ambulatory Visit | Attending: Nephrology | Admitting: Nephrology

## 2013-07-14 DIAGNOSIS — D509 Iron deficiency anemia, unspecified: Secondary | ICD-10-CM | POA: Insufficient documentation

## 2013-07-14 LAB — IRON AND TIBC
Iron: 79 ug/dL (ref 42–135)
SATURATION RATIOS: 41 % (ref 20–55)
TIBC: 192 ug/dL — ABNORMAL LOW (ref 250–470)
UIBC: 113 ug/dL — AB (ref 125–400)

## 2013-07-14 LAB — FERRITIN: Ferritin: 844 ng/mL — ABNORMAL HIGH (ref 10–291)

## 2013-07-14 LAB — POCT HEMOGLOBIN-HEMACUE: Hemoglobin: 10.5 g/dL — ABNORMAL LOW (ref 12.0–15.0)

## 2013-07-14 MED ORDER — CLONIDINE HCL 0.1 MG PO TABS: 0.1000 mg | ORAL_TABLET | ORAL | Status: DC | PRN

## 2013-07-14 MED ORDER — EPOETIN ALFA 10000 UNIT/ML IJ SOLN
INTRAMUSCULAR | Status: AC
  Administered 2013-07-14: 10000 [IU] via SUBCUTANEOUS
  Filled 2013-07-14: qty 1

## 2013-07-14 MED ORDER — EPOETIN ALFA 20000 UNIT/ML IJ SOLN
INTRAMUSCULAR | Status: AC
  Administered 2013-07-14: 20000 [IU] via SUBCUTANEOUS
  Filled 2013-07-14: qty 1

## 2013-07-14 MED ORDER — EPOETIN ALFA 40000 UNIT/ML IJ SOLN: 30000.0000 [IU] | INTRAMUSCULAR | Status: DC

## 2013-07-26 ENCOUNTER — Encounter: Payer: PRIVATE HEALTH INSURANCE | Admitting: Internal Medicine

## 2013-07-28 ENCOUNTER — Encounter (HOSPITAL_COMMUNITY)
Admission: RE | Admit: 2013-07-28 | Discharge: 2013-07-28 | Disposition: A | Discharge status: Home / Self Care | Payer: PRIVATE HEALTH INSURANCE | Source: Ambulatory Visit | Attending: Nephrology | Admitting: Nephrology

## 2013-07-28 MED ORDER — EPOETIN ALFA 10000 UNIT/ML IJ SOLN
INTRAMUSCULAR | Status: DC
Start: 2013-07-28 — End: 2013-07-28
  Filled 2013-07-28: qty 1

## 2013-07-28 MED ORDER — EPOETIN ALFA 20000 UNIT/ML IJ SOLN
INTRAMUSCULAR | Status: AC
  Filled 2013-07-28: qty 1

## 2013-07-28 MED ORDER — EPOETIN ALFA 20000 UNIT/ML IJ SOLN
INTRAMUSCULAR | Status: AC
  Administered 2013-07-28: 20000 [IU] via SUBCUTANEOUS
  Filled 2013-07-28: qty 1

## 2013-07-28 MED ORDER — EPOETIN ALFA 10000 UNIT/ML IJ SOLN
INTRAMUSCULAR | Status: AC
  Administered 2013-07-28: 10000 [IU] via SUBCUTANEOUS
  Filled 2013-07-28: qty 1

## 2013-07-28 MED ORDER — EPOETIN ALFA 40000 UNIT/ML IJ SOLN: 30000.0000 [IU] | INTRAMUSCULAR | Status: DC

## 2013-07-31 ENCOUNTER — Other Ambulatory Visit: Payer: Self-pay | Admitting: Internal Medicine

## 2013-07-31 LAB — POCT HEMOGLOBIN-HEMACUE: HEMOGLOBIN: 11.1 g/dL — AB (ref 12.0–15.0)

## 2013-08-09 ENCOUNTER — Ambulatory Visit (INDEPENDENT_AMBULATORY_CARE_PROVIDER_SITE_OTHER): Payer: PRIVATE HEALTH INSURANCE | Admitting: Internal Medicine

## 2013-08-09 ENCOUNTER — Encounter: Payer: Self-pay | Admitting: Internal Medicine

## 2013-08-09 VITALS — BP 140/70 | HR 80 | Temp 98.1°F | Ht 63.0 in | Wt 263.0 lb

## 2013-08-09 DIAGNOSIS — I10 Essential (primary) hypertension: Secondary | ICD-10-CM

## 2013-08-09 DIAGNOSIS — E119 Type 2 diabetes mellitus without complications: Secondary | ICD-10-CM

## 2013-08-09 DIAGNOSIS — J4489 Other specified chronic obstructive pulmonary disease: Secondary | ICD-10-CM

## 2013-08-09 DIAGNOSIS — N189 Chronic kidney disease, unspecified: Secondary | ICD-10-CM

## 2013-08-09 DIAGNOSIS — G4730 Sleep apnea, unspecified: Secondary | ICD-10-CM

## 2013-08-09 DIAGNOSIS — I129 Hypertensive chronic kidney disease with stage 1 through stage 4 chronic kidney disease, or unspecified chronic kidney disease: Secondary | ICD-10-CM

## 2013-08-09 DIAGNOSIS — J449 Chronic obstructive pulmonary disease, unspecified: Secondary | ICD-10-CM

## 2013-08-09 DIAGNOSIS — E1149 Type 2 diabetes mellitus with other diabetic neurological complication: Secondary | ICD-10-CM

## 2013-08-09 LAB — GLUCOSE, CAPILLARY: Glucose-Capillary: 135 mg/dL — ABNORMAL HIGH (ref 70–99)

## 2013-08-09 LAB — POCT GLYCOSYLATED HEMOGLOBIN (HGB A1C): Hemoglobin A1C: 6.7

## 2013-08-09 MED ORDER — FLUTICASONE PROPIONATE 50 MCG/ACT NA SUSP: 2.0000 | Freq: Every day | NASAL | Status: DC

## 2013-08-09 MED ORDER — AMLODIPINE BESYLATE 10 MG PO TABS: 10.0000 mg | ORAL_TABLET | Freq: Every day | ORAL | Status: DC

## 2013-08-09 NOTE — Assessment & Plan Note (Addendum)
SpO2 Readings from Last 3 Encounters:  08/09/13 95%  07/28/13 100%  06/23/13 96%    Assessment: Patient reports that she is compliant with her CPAP; she reports no daytime symptoms.  Plan: Continue CPAP.

## 2013-08-09 NOTE — Progress Notes (Signed)
   Subjective:    Patient ID: Joan Banks, female    DOB: March 08, 1945, 68 y.o.   MRN: [id number]  HPI Patient returns for management of her diabetes mellitus, hypertension, COPD, and other chronic medical problems.  Today she has no acute complaints, and reports that she has been doing well.  She brought her medications to clinic for review, and she reports that she has been compliant with her medications.  She reports that she uses her CPAP regularly.  She has occasional leg edema for which she takes an extra 40 mg furosemide tablet; she reports that the edema resolves when she does this.   Review of Systems  Constitutional: Negative for fever, chills and diaphoresis.  Respiratory: Negative for shortness of breath and wheezing.   Cardiovascular: Negative for chest pain.  Gastrointestinal: Negative for nausea, vomiting and abdominal pain.  Genitourinary: Negative for dysuria and frequency.       Objective:   Physical Exam  Constitutional: No distress.  Cardiovascular: Normal rate and regular rhythm.  Exam reveals no S3 and no S4.   Murmur heard.  Systolic murmur is present with a grade of 1/6  1+ bilateral ankle edema  Abdominal: Soft. Bowel sounds are normal. There is no tenderness.        Assessment & Plan:

## 2013-08-09 NOTE — Assessment & Plan Note (Signed)
Lab Results  Component Value Date   HGBA1C 6.7 08/09/2013   HGBA1C 7.2 04/26/2013   HGBA1C 7.1 01/04/2013     Assessment: Diabetes control: good control (HgbA1C at goal) Progress toward A1C goal:  at goal Comments: Diabetes is well controlled on Lantus Solostar insulin 45 units at bedtime, glipizide XL 20 mg daily, and Januvia 50 mg daily.  Plan: Medications:  continue current medications Home glucose monitoring: Frequency: 2 times a day Timing: before breakfast;before dinner Instruction/counseling given: reminded to get eye exam

## 2013-08-09 NOTE — Patient Instructions (Signed)
Continue current medications. Please make an appointment to see your eye doctor.

## 2013-08-09 NOTE — Assessment & Plan Note (Signed)
BP Readings from Last 3 Encounters:  08/09/13 140/70  07/28/13 144/69  06/23/13 134/61    Lab Results  Component Value Date   NA 145 06/09/2013   K 4.6 06/09/2013   CREATININE 1.49* 06/09/2013    Assessment: Blood pressure control: controlled Progress toward BP goal:  at goal Comments: Blood pressure is controlled on amlodipine 10 mg daily, atenolol 50 mg twice a day, clonidine 0.3 mg twice a day, enalapril 20 mg twice a day, and furosemide 40 mg daily  Plan: Medications:  continue current medications

## 2013-08-09 NOTE — Assessment & Plan Note (Signed)
Assessment: Patient has chronic bilateral tingling pain in both feet that radiates into her legs, consistent with diabetic neuropathy.  At the time of her last visit I prescribed desipramine 10 mg daily at bedtime.  However, she did not start taking this medication because she was not sure what it was for.  Plan: I discussed the potential side effects of desipramine as well as the potential benefit with patient, and encouraged her to try the medication and to call if she develops any apparent side effects or has any problems.

## 2013-08-09 NOTE — Assessment & Plan Note (Signed)
Assessment: Patient is doing well on Combivent with no recent breathing problems.  Plan: Continue current inhaler regimen.

## 2013-08-09 NOTE — Assessment & Plan Note (Addendum)
Lab Results  Component Value Date   CREATININE 1.49* 06/09/2013   CREATININE 1.58* 04/26/2013   CREATININE 1.84* 01/04/2013   CREATININE 1.64* 09/07/2012   CREATININE 1.76* 06/22/2012   CREATININE 1.55* 03/31/2012     Assessment: Patient is followed by nephrologist Dr. Posey Pronto.  Her creatinine has been stable.  She is getting regular Procrit injections managed by Dr. Posey Pronto  Plan: Check a comprehensive metabolic panel and CBC with differential today.  Patient will followup with Dr. Posey Pronto.

## 2013-08-11 ENCOUNTER — Encounter (HOSPITAL_COMMUNITY)
Admission: RE | Admit: 2013-08-11 | Discharge: 2013-08-11 | Disposition: A | Discharge status: Home / Self Care | Payer: PRIVATE HEALTH INSURANCE | Source: Ambulatory Visit | Attending: Nephrology | Admitting: Nephrology

## 2013-08-11 ENCOUNTER — Other Ambulatory Visit: Payer: PRIVATE HEALTH INSURANCE

## 2013-08-11 DIAGNOSIS — D509 Iron deficiency anemia, unspecified: Secondary | ICD-10-CM | POA: Diagnosis not present

## 2013-08-11 DIAGNOSIS — N189 Chronic kidney disease, unspecified: Secondary | ICD-10-CM

## 2013-08-11 LAB — CBC WITH DIFFERENTIAL/PLATELET
Basophils Absolute: 0 10*3/uL (ref 0.0–0.1)
Basophils Relative: 0 % (ref 0–1)
EOS PCT: 3 % (ref 0–5)
Eosinophils Absolute: 0.2 10*3/uL (ref 0.0–0.7)
HEMATOCRIT: 36.7 % (ref 36.0–46.0)
Hemoglobin: 10.9 g/dL — ABNORMAL LOW (ref 12.0–15.0)
LYMPHS ABS: 2 10*3/uL (ref 0.7–4.0)
LYMPHS PCT: 27 % (ref 12–46)
MCH: 23.4 pg — ABNORMAL LOW (ref 26.0–34.0)
MCHC: 29.7 g/dL — ABNORMAL LOW (ref 30.0–36.0)
MCV: 78.9 fL (ref 78.0–100.0)
MONO ABS: 0.5 10*3/uL (ref 0.1–1.0)
Monocytes Relative: 7 % (ref 3–12)
Neutro Abs: 4.7 10*3/uL (ref 1.7–7.7)
Neutrophils Relative %: 63 % (ref 43–77)
Platelets: 207 10*3/uL (ref 150–400)
RBC: 4.65 MIL/uL (ref 3.87–5.11)
RDW: 16.8 % — AB (ref 11.5–15.5)
WBC: 7.4 10*3/uL (ref 4.0–10.5)

## 2013-08-11 LAB — COMPLETE METABOLIC PANEL WITH GFR
ALBUMIN: 3.3 g/dL — AB (ref 3.5–5.2)
ALT: 19 U/L (ref 0–35)
AST: 23 U/L (ref 0–37)
Alkaline Phosphatase: 82 U/L (ref 39–117)
BUN: 22 mg/dL (ref 6–23)
CO2: 19 mEq/L (ref 19–32)
Calcium: 10 mg/dL (ref 8.4–10.5)
Chloride: 103 mEq/L (ref 96–112)
Creat: 1.47 mg/dL — ABNORMAL HIGH (ref 0.50–1.10)
GFR, Est African American: 42 mL/min — ABNORMAL LOW
GFR, Est Non African American: 36 mL/min — ABNORMAL LOW
Glucose, Bld: 276 mg/dL — ABNORMAL HIGH (ref 70–99)
POTASSIUM: 4.6 meq/L (ref 3.5–5.3)
Sodium: 142 mEq/L (ref 135–145)
Total Bilirubin: <0.2 mg/dL — ABNORMAL LOW (ref 0.2–1.2)
Total Protein: 7.4 g/dL (ref 6.0–8.3)

## 2013-08-11 LAB — IRON AND TIBC
IRON: 70 ug/dL (ref 42–135)
Saturation Ratios: 33 % (ref 20–55)
TIBC: 212 ug/dL — AB (ref 250–470)
UIBC: 142 ug/dL (ref 125–400)

## 2013-08-11 LAB — FERRITIN: FERRITIN: 973 ng/mL — AB (ref 10–291)

## 2013-08-11 LAB — POCT HEMOGLOBIN-HEMACUE: Hemoglobin: 10.4 g/dL — ABNORMAL LOW (ref 12.0–15.0)

## 2013-08-11 MED ORDER — EPOETIN ALFA 20000 UNIT/ML IJ SOLN
INTRAMUSCULAR | Status: AC
  Administered 2013-08-11: 20000 [IU] via SUBCUTANEOUS
  Filled 2013-08-11: qty 1

## 2013-08-11 MED ORDER — EPOETIN ALFA 40000 UNIT/ML IJ SOLN: 30000.0000 [IU] | INTRAMUSCULAR | Status: DC

## 2013-08-11 MED ORDER — EPOETIN ALFA 10000 UNIT/ML IJ SOLN
INTRAMUSCULAR | Status: AC
  Administered 2013-08-11: 10000 [IU] via SUBCUTANEOUS
  Filled 2013-08-11: qty 1

## 2013-08-22 ENCOUNTER — Other Ambulatory Visit: Payer: Self-pay | Admitting: Internal Medicine

## 2013-08-25 ENCOUNTER — Encounter (HOSPITAL_COMMUNITY)
Admission: RE | Admit: 2013-08-25 | Discharge: 2013-08-25 | Disposition: A | Discharge status: Home / Self Care | Payer: PRIVATE HEALTH INSURANCE | Source: Ambulatory Visit | Attending: Nephrology | Admitting: Nephrology

## 2013-08-25 DIAGNOSIS — D509 Iron deficiency anemia, unspecified: Secondary | ICD-10-CM | POA: Diagnosis not present

## 2013-08-25 LAB — POCT HEMOGLOBIN-HEMACUE: Hemoglobin: 10.7 g/dL — ABNORMAL LOW (ref 12.0–15.0)

## 2013-08-25 MED ORDER — EPOETIN ALFA 40000 UNIT/ML IJ SOLN: 30000.0000 [IU] | INTRAMUSCULAR | Status: DC

## 2013-08-25 MED ORDER — CLONIDINE HCL 0.1 MG PO TABS: 0.1000 mg | ORAL_TABLET | ORAL | Status: DC | PRN

## 2013-08-25 MED ORDER — EPOETIN ALFA 10000 UNIT/ML IJ SOLN
INTRAMUSCULAR | Status: AC
  Administered 2013-08-25: 10000 [IU] via SUBCUTANEOUS
  Filled 2013-08-25: qty 1

## 2013-08-25 MED ORDER — EPOETIN ALFA 20000 UNIT/ML IJ SOLN
INTRAMUSCULAR | Status: AC
  Administered 2013-08-25: 20000 [IU] via SUBCUTANEOUS
  Filled 2013-08-25: qty 1

## 2013-09-08 ENCOUNTER — Encounter (HOSPITAL_COMMUNITY)
Admission: RE | Admit: 2013-09-08 | Discharge: 2013-09-08 | Disposition: A | Discharge status: Home / Self Care | Payer: PRIVATE HEALTH INSURANCE | Source: Ambulatory Visit | Attending: Nephrology | Admitting: Nephrology

## 2013-09-08 DIAGNOSIS — D509 Iron deficiency anemia, unspecified: Secondary | ICD-10-CM | POA: Insufficient documentation

## 2013-09-08 LAB — FERRITIN: FERRITIN: 844 ng/mL — AB (ref 10–291)

## 2013-09-08 LAB — IRON AND TIBC
IRON: 69 ug/dL (ref 42–135)
Saturation Ratios: 32 % (ref 20–55)
TIBC: 219 ug/dL — ABNORMAL LOW (ref 250–470)
UIBC: 150 ug/dL (ref 125–400)

## 2013-09-08 LAB — POCT HEMOGLOBIN-HEMACUE: Hemoglobin: 10.5 g/dL — ABNORMAL LOW (ref 12.0–15.0)

## 2013-09-08 MED ORDER — EPOETIN ALFA 20000 UNIT/ML IJ SOLN
INTRAMUSCULAR | Status: AC
  Administered 2013-09-08: 20000 [IU] via SUBCUTANEOUS
  Filled 2013-09-08: qty 1

## 2013-09-08 MED ORDER — CLONIDINE HCL 0.1 MG PO TABS: 0.1000 mg | ORAL_TABLET | ORAL | Status: DC | PRN

## 2013-09-08 MED ORDER — EPOETIN ALFA 10000 UNIT/ML IJ SOLN
INTRAMUSCULAR | Status: AC
  Administered 2013-09-08: 10000 [IU] via SUBCUTANEOUS
  Filled 2013-09-08: qty 1

## 2013-09-08 MED ORDER — EPOETIN ALFA 40000 UNIT/ML IJ SOLN: 30000.0000 [IU] | INTRAMUSCULAR | Status: DC

## 2013-09-22 ENCOUNTER — Encounter (HOSPITAL_COMMUNITY): Payer: PRIVATE HEALTH INSURANCE

## 2013-09-23 ENCOUNTER — Other Ambulatory Visit: Payer: Self-pay | Admitting: Internal Medicine

## 2013-09-24 ENCOUNTER — Other Ambulatory Visit: Payer: Self-pay | Admitting: Internal Medicine

## 2013-09-25 ENCOUNTER — Other Ambulatory Visit: Payer: Self-pay | Admitting: Internal Medicine

## 2013-09-25 DIAGNOSIS — E1149 Type 2 diabetes mellitus with other diabetic neurological complication: Secondary | ICD-10-CM

## 2013-09-25 NOTE — Telephone Encounter (Signed)
Refill approved - nurse to call in. 

## 2013-09-27 NOTE — Telephone Encounter (Signed)
done

## 2013-09-29 ENCOUNTER — Encounter (HOSPITAL_COMMUNITY)
Admission: RE | Admit: 2013-09-29 | Discharge: 2013-09-29 | Disposition: A | Discharge status: Home / Self Care | Payer: PRIVATE HEALTH INSURANCE | Source: Ambulatory Visit | Attending: Nephrology | Admitting: Nephrology

## 2013-09-29 DIAGNOSIS — D509 Iron deficiency anemia, unspecified: Secondary | ICD-10-CM | POA: Diagnosis not present

## 2013-09-29 LAB — POCT HEMOGLOBIN-HEMACUE: Hemoglobin: 10 g/dL — ABNORMAL LOW (ref 12.0–15.0)

## 2013-09-29 MED ORDER — EPOETIN ALFA 20000 UNIT/ML IJ SOLN
INTRAMUSCULAR | Status: AC
  Administered 2013-09-29: 20000 [IU] via SUBCUTANEOUS
  Filled 2013-09-29: qty 1

## 2013-09-29 MED ORDER — EPOETIN ALFA 10000 UNIT/ML IJ SOLN
INTRAMUSCULAR | Status: AC
  Administered 2013-09-29: 10000 [IU] via SUBCUTANEOUS
  Filled 2013-09-29: qty 1

## 2013-09-29 MED ORDER — EPOETIN ALFA 40000 UNIT/ML IJ SOLN: 30000.0000 [IU] | INTRAMUSCULAR | Status: DC

## 2013-09-29 MED ORDER — CLONIDINE HCL 0.1 MG PO TABS: 0.1000 mg | ORAL_TABLET | ORAL | Status: DC | PRN

## 2013-10-13 ENCOUNTER — Encounter (HOSPITAL_COMMUNITY)
Admission: RE | Admit: 2013-10-13 | Discharge: 2013-10-13 | Disposition: A | Discharge status: Home / Self Care | Payer: PRIVATE HEALTH INSURANCE | Source: Ambulatory Visit | Attending: Nephrology | Admitting: Nephrology

## 2013-10-13 DIAGNOSIS — D509 Iron deficiency anemia, unspecified: Secondary | ICD-10-CM | POA: Diagnosis present

## 2013-10-13 LAB — IRON AND TIBC
Iron: 86 ug/dL (ref 42–135)
Saturation Ratios: 43 % (ref 20–55)
TIBC: 201 ug/dL — ABNORMAL LOW (ref 250–470)
UIBC: 115 ug/dL — AB (ref 125–400)

## 2013-10-13 LAB — POCT HEMOGLOBIN-HEMACUE: Hemoglobin: 9.9 g/dL — ABNORMAL LOW (ref 12.0–15.0)

## 2013-10-13 LAB — FERRITIN: FERRITIN: 866 ng/mL — AB (ref 10–291)

## 2013-10-13 MED ORDER — DARBEPOETIN ALFA-POLYSORBATE 200 MCG/0.4ML IJ SOLN
INTRAMUSCULAR | Status: AC
  Filled 2013-10-13: qty 0.4

## 2013-10-13 MED ORDER — EPOETIN ALFA 10000 UNIT/ML IJ SOLN
INTRAMUSCULAR | Status: AC
  Administered 2013-10-13: 10000 [IU] via SUBCUTANEOUS
  Filled 2013-10-13: qty 1

## 2013-10-13 MED ORDER — EPOETIN ALFA 40000 UNIT/ML IJ SOLN: 30000.0000 [IU] | INTRAMUSCULAR | Status: DC

## 2013-10-13 MED ORDER — EPOETIN ALFA 20000 UNIT/ML IJ SOLN
INTRAMUSCULAR | Status: AC
  Administered 2013-10-13: 20000 [IU] via SUBCUTANEOUS
  Filled 2013-10-13: qty 1

## 2013-10-26 ENCOUNTER — Other Ambulatory Visit: Payer: Self-pay | Admitting: *Deleted

## 2013-10-26 MED ORDER — INSULIN GLARGINE 100 UNIT/ML SOLOSTAR PEN
PEN_INJECTOR | SUBCUTANEOUS | Status: DC
Start: 2013-10-26 — End: 2014-05-01

## 2013-10-27 ENCOUNTER — Encounter (HOSPITAL_COMMUNITY)
Admission: RE | Admit: 2013-10-27 | Discharge: 2013-10-27 | Disposition: A | Discharge status: Home / Self Care | Payer: PRIVATE HEALTH INSURANCE | Source: Ambulatory Visit | Attending: Nephrology | Admitting: Nephrology

## 2013-10-27 DIAGNOSIS — D509 Iron deficiency anemia, unspecified: Secondary | ICD-10-CM | POA: Diagnosis not present

## 2013-10-27 LAB — POCT HEMOGLOBIN-HEMACUE: HEMOGLOBIN: 9.9 g/dL — AB (ref 12.0–15.0)

## 2013-10-27 MED ORDER — EPOETIN ALFA 20000 UNIT/ML IJ SOLN
INTRAMUSCULAR | Status: AC
  Administered 2013-10-27: 20000 [IU]
  Filled 2013-10-27: qty 1

## 2013-10-27 MED ORDER — EPOETIN ALFA 10000 UNIT/ML IJ SOLN
INTRAMUSCULAR | Status: AC
  Administered 2013-10-27: 10000 [IU]
  Filled 2013-10-27: qty 1

## 2013-10-27 MED ORDER — EPOETIN ALFA 40000 UNIT/ML IJ SOLN: 30000.0000 [IU] | INTRAMUSCULAR | Status: DC

## 2013-10-27 MED ORDER — CLONIDINE HCL 0.1 MG PO TABS: 0.1000 mg | ORAL_TABLET | ORAL | Status: DC | PRN

## 2013-10-28 ENCOUNTER — Other Ambulatory Visit: Payer: Self-pay | Admitting: Internal Medicine

## 2013-11-09 ENCOUNTER — Encounter (HOSPITAL_COMMUNITY)
Admission: RE | Admit: 2013-11-09 | Discharge: 2013-11-09 | Disposition: A | Discharge status: Home / Self Care | Payer: PRIVATE HEALTH INSURANCE | Source: Ambulatory Visit | Attending: Nephrology | Admitting: Nephrology

## 2013-11-09 DIAGNOSIS — N184 Chronic kidney disease, stage 4 (severe): Secondary | ICD-10-CM | POA: Diagnosis not present

## 2013-11-09 DIAGNOSIS — D631 Anemia in chronic kidney disease: Secondary | ICD-10-CM | POA: Insufficient documentation

## 2013-11-09 LAB — IRON AND TIBC
Iron: 65 ug/dL (ref 42–135)
SATURATION RATIOS: 31 % (ref 20–55)
TIBC: 210 ug/dL — ABNORMAL LOW (ref 250–470)
UIBC: 145 ug/dL (ref 125–400)

## 2013-11-09 LAB — POCT HEMOGLOBIN-HEMACUE: HEMOGLOBIN: 11 g/dL — AB (ref 12.0–15.0)

## 2013-11-09 LAB — FERRITIN: Ferritin: 1060 ng/mL — ABNORMAL HIGH (ref 10–291)

## 2013-11-09 MED ORDER — CLONIDINE HCL 0.1 MG PO TABS: 0.1000 mg | ORAL_TABLET | ORAL | Status: DC | PRN

## 2013-11-09 MED ORDER — EPOETIN ALFA 20000 UNIT/ML IJ SOLN
INTRAMUSCULAR | Status: AC
  Administered 2013-11-09: 20000 [IU] via SUBCUTANEOUS
  Filled 2013-11-09: qty 1

## 2013-11-09 MED ORDER — EPOETIN ALFA 10000 UNIT/ML IJ SOLN
INTRAMUSCULAR | Status: AC
  Administered 2013-11-09: 10000 [IU] via SUBCUTANEOUS
  Filled 2013-11-09: qty 1

## 2013-11-09 MED ORDER — EPOETIN ALFA 40000 UNIT/ML IJ SOLN: 30000.0000 [IU] | INTRAMUSCULAR | Status: DC

## 2013-11-21 ENCOUNTER — Other Ambulatory Visit: Payer: Self-pay | Admitting: Internal Medicine

## 2013-11-21 NOTE — Telephone Encounter (Signed)
Please schedule a follow-up appointment with me when available.

## 2013-11-22 NOTE — Telephone Encounter (Signed)
Message sent to front office to schedule pt an appt. 

## 2013-11-23 ENCOUNTER — Encounter (HOSPITAL_COMMUNITY): Payer: PRIVATE HEALTH INSURANCE

## 2013-11-30 ENCOUNTER — Other Ambulatory Visit (HOSPITAL_COMMUNITY): Payer: Self-pay | Admitting: *Deleted

## 2013-12-01 ENCOUNTER — Encounter (HOSPITAL_COMMUNITY)
Admission: RE | Admit: 2013-12-01 | Discharge: 2013-12-01 | Disposition: A | Discharge status: Home / Self Care | Payer: PRIVATE HEALTH INSURANCE | Source: Ambulatory Visit | Attending: Nephrology | Admitting: Nephrology

## 2013-12-01 DIAGNOSIS — D631 Anemia in chronic kidney disease: Secondary | ICD-10-CM | POA: Diagnosis not present

## 2013-12-01 LAB — POCT HEMOGLOBIN-HEMACUE: HEMOGLOBIN: 10.2 g/dL — AB (ref 12.0–15.0)

## 2013-12-01 MED ORDER — EPOETIN ALFA 20000 UNIT/ML IJ SOLN
INTRAMUSCULAR | Status: AC
  Administered 2013-12-01: 20000 [IU] via SUBCUTANEOUS
  Filled 2013-12-01: qty 1

## 2013-12-01 MED ORDER — EPOETIN ALFA 10000 UNIT/ML IJ SOLN
INTRAMUSCULAR | Status: AC
  Administered 2013-12-01: 10000 [IU] via SUBCUTANEOUS
  Filled 2013-12-01: qty 1

## 2013-12-01 MED ORDER — CLONIDINE HCL 0.1 MG PO TABS: 0.1000 mg | ORAL_TABLET | ORAL | Status: DC | PRN

## 2013-12-01 MED ORDER — EPOETIN ALFA 40000 UNIT/ML IJ SOLN: 30000.0000 [IU] | INTRAMUSCULAR | Status: DC

## 2013-12-12 ENCOUNTER — Encounter: Payer: Self-pay | Admitting: Internal Medicine

## 2013-12-12 ENCOUNTER — Ambulatory Visit (INDEPENDENT_AMBULATORY_CARE_PROVIDER_SITE_OTHER): Payer: PRIVATE HEALTH INSURANCE | Admitting: *Deleted

## 2013-12-12 ENCOUNTER — Ambulatory Visit (INDEPENDENT_AMBULATORY_CARE_PROVIDER_SITE_OTHER): Payer: PRIVATE HEALTH INSURANCE | Admitting: Internal Medicine

## 2013-12-12 VITALS — BP 171/73 | HR 84 | Temp 98.0°F | Ht 63.0 in | Wt 260.3 lb

## 2013-12-12 DIAGNOSIS — N189 Chronic kidney disease, unspecified: Secondary | ICD-10-CM

## 2013-12-12 DIAGNOSIS — G4730 Sleep apnea, unspecified: Secondary | ICD-10-CM

## 2013-12-12 DIAGNOSIS — J449 Chronic obstructive pulmonary disease, unspecified: Secondary | ICD-10-CM

## 2013-12-12 DIAGNOSIS — Z23 Encounter for immunization: Secondary | ICD-10-CM

## 2013-12-12 DIAGNOSIS — L608 Other nail disorders: Secondary | ICD-10-CM

## 2013-12-12 DIAGNOSIS — E785 Hyperlipidemia, unspecified: Secondary | ICD-10-CM

## 2013-12-12 DIAGNOSIS — D509 Iron deficiency anemia, unspecified: Secondary | ICD-10-CM

## 2013-12-12 DIAGNOSIS — I10 Essential (primary) hypertension: Secondary | ICD-10-CM

## 2013-12-12 DIAGNOSIS — E118 Type 2 diabetes mellitus with unspecified complications: Secondary | ICD-10-CM

## 2013-12-12 LAB — COMPLETE METABOLIC PANEL WITH GFR
ALK PHOS: 65 U/L (ref 39–117)
ALT: 15 U/L (ref 0–35)
AST: 20 U/L (ref 0–37)
Albumin: 3.8 g/dL (ref 3.5–5.2)
BUN: 26 mg/dL — AB (ref 6–23)
CHLORIDE: 106 meq/L (ref 96–112)
CO2: 24 mEq/L (ref 19–32)
Calcium: 9.7 mg/dL (ref 8.4–10.5)
Creat: 1.74 mg/dL — ABNORMAL HIGH (ref 0.50–1.10)
GFR, Est African American: 34 mL/min — ABNORMAL LOW
GFR, Est Non African American: 29 mL/min — ABNORMAL LOW
Glucose, Bld: 165 mg/dL — ABNORMAL HIGH (ref 70–99)
Potassium: 3.9 mEq/L (ref 3.5–5.3)
Sodium: 143 mEq/L (ref 135–145)
Total Bilirubin: 0.3 mg/dL (ref 0.2–1.2)
Total Protein: 7 g/dL (ref 6.0–8.3)

## 2013-12-12 LAB — CBC WITH DIFFERENTIAL/PLATELET
Basophils Absolute: 0 10*3/uL (ref 0.0–0.1)
Basophils Relative: 0 % (ref 0–1)
EOS PCT: 4 % (ref 0–5)
Eosinophils Absolute: 0.3 10*3/uL (ref 0.0–0.7)
HCT: 32.1 % — ABNORMAL LOW (ref 36.0–46.0)
Hemoglobin: 9.9 g/dL — ABNORMAL LOW (ref 12.0–15.0)
LYMPHS ABS: 2.1 10*3/uL (ref 0.7–4.0)
LYMPHS PCT: 25 % (ref 12–46)
MCH: 23.5 pg — ABNORMAL LOW (ref 26.0–34.0)
MCHC: 30.8 g/dL (ref 30.0–36.0)
MCV: 76.2 fL — AB (ref 78.0–100.0)
Monocytes Absolute: 0.7 10*3/uL (ref 0.1–1.0)
Monocytes Relative: 8 % (ref 3–12)
NEUTROS ABS: 5.2 10*3/uL (ref 1.7–7.7)
NEUTROS PCT: 63 % (ref 43–77)
PLATELETS: 251 10*3/uL (ref 150–400)
RBC: 4.21 MIL/uL (ref 3.87–5.11)
RDW: 16.4 % — ABNORMAL HIGH (ref 11.5–15.5)
WBC: 8.3 10*3/uL (ref 4.0–10.5)

## 2013-12-12 LAB — LIPID PANEL
Cholesterol: 102 mg/dL (ref 0–200)
HDL: 34 mg/dL — ABNORMAL LOW (ref >39)
LDL Cholesterol: 31 mg/dL (ref 0–99)
TRIGLYCERIDES: 186 mg/dL — AB (ref <150)
Total CHOL/HDL Ratio: 3 Ratio
VLDL: 37 mg/dL (ref 0–40)

## 2013-12-12 LAB — POCT GLYCOSYLATED HEMOGLOBIN (HGB A1C): HEMOGLOBIN A1C: 7.4

## 2013-12-12 LAB — GLUCOSE, CAPILLARY: Glucose-Capillary: 165 mg/dL — ABNORMAL HIGH (ref 70–99)

## 2013-12-12 MED ORDER — FUROSEMIDE 40 MG PO TABS: 60.0000 mg | ORAL_TABLET | Freq: Every day | ORAL | Status: DC

## 2013-12-12 MED ORDER — GLIPIZIDE ER 10 MG PO TB24: 20.0000 mg | ORAL_TABLET | Freq: Every day | ORAL | Status: DC

## 2013-12-12 NOTE — Assessment & Plan Note (Signed)
Lab Results  Component Value Date   HGB 10.2* 12/01/2013   HGB 11.0* 11/09/2013   HGB 9.9* 10/27/2013   FERRITIN 1060* 11/09/2013     Assessment: Patient has no symptoms of anemia.  She is currently receiving Procrit injections managed by her nephrologist, and also is taking iron supplementation with ferrous sulfate 325 mg twice a day.  Plan: Check a CBC today; continue current medications.

## 2013-12-12 NOTE — Assessment & Plan Note (Signed)
Assessment: Patient has dysmorphic toenails bilaterally, which may be due to onychomycosis.  Plan: Refer to podiatry.

## 2013-12-12 NOTE — Assessment & Plan Note (Signed)
BP Readings from Last 3 Encounters:  12/12/13 171/73  12/01/13 158/69  11/09/13 152/77    Lab Results  Component Value Date   NA 142 08/11/2013   K 4.6 08/11/2013   CREATININE 1.47* 08/11/2013    Assessment: Blood pressure control: moderately elevated Progress toward BP goal:  deteriorated Comments: Blood pressure is moderately elevated on amlodipine 10 mg daily, atenolol 100 mg daily, clonidine 0.3 mg twice a day, enalapril 20 mg twice a day, and furosemide 40 mg daily  Plan: Medications:  Given bilateral leg edema, will increase furosemide to a dose of 60 mg daily, and continue other medications as before.

## 2013-12-12 NOTE — Assessment & Plan Note (Signed)
Lab Results  Component Value Date   HGBA1C 7.4 12/12/2013   HGBA1C 6.7 08/09/2013   HGBA1C 7.2 04/26/2013     Assessment:  Diabetes control: fair control Progress toward A1C goal:  deteriorated Comments: hemoglobin A1c is slightly above target on glipizide XL 20 mg daily, Januvia 50 mg daily, and Lantus insulin 45 units daily at bedtime  Plan: Medications:  continue current medications Home glucose monitoring: Frequency: 2 times a day Timing: before breakfast, before dinner Instruction/counseling given: reminded to get eye exam and discussed foot care Other plans: check a comprehensive metabolic panel today

## 2013-12-12 NOTE — Patient Instructions (Addendum)
Stop glipizide (Glucotrol) XL 5 mg tablets. Start glipizide (Glucotrol) XL 10 mg take 2 tablets daily. Increase furosemide (Lasix) 40 mg tablet to a dose of 1 and 1/2 tablets daily. Please schedule an appointment with your eye doctor for an eye exam.

## 2013-12-12 NOTE — Assessment & Plan Note (Signed)
SpO2 Readings from Last 3 Encounters:  12/12/13 100%  12/01/13 100%  11/09/13 95%    Assessment: Patient is doing well on Combivent.  Plan: Continue current inhaler regimen; get overnight oximetry to assess need for nocturnal oxygen.

## 2013-12-12 NOTE — Progress Notes (Signed)
   Subjective:    Patient ID: Joan Banks, female    DOB: 20-Dec-1945, 68 y.o.   MRN: [id number]  HPI Patient returns for management of her diabetes mellitus, hypertension, chronic kidney disease, hyperlipidemia, obstructive sleep apnea, COPD, and other chronic medical problems.  She is doing well today, without any acute complaints.  She reports that she is compliant with her medications. She says that she has been using her CPAP regularly; she did ask about discontinuing her nocturnal oxygen, since she says she has not been wearing it for approximately one year and feels fine without it. She takes Tylenol with codeine occasionally as needed for chronic bilateral knee pain and right hip pain which are intermittent and stable.  Review of Systems  Constitutional: Negative for fever, chills and diaphoresis.  Respiratory: Positive for shortness of breath (Stable chronic exertional shortness of breath). Negative for cough and wheezing.   Cardiovascular: Positive for leg swelling. Negative for chest pain.  Gastrointestinal: Negative for nausea, vomiting, abdominal pain and blood in stool.  Genitourinary: Negative for dysuria and difficulty urinating.  Musculoskeletal: Positive for arthralgias (Chronic intermittent bilateral knee pain and right hip pain, stable). Negative for back pain.  Neurological: Negative for weakness, numbness and headaches.    I reviewed and updated the medication list, allergies, past medical history, past surgical history, family history, and social history.      Objective:   Physical Exam  Constitutional: No distress.  Cardiovascular: Normal rate, regular rhythm, S1 normal and S2 normal.  Exam reveals no gallop, no S3, no S4 and no friction rub.   Murmur heard.  Systolic murmur is present with a grade of 2/6  2+ bilateral pitting leg edema  Pulmonary/Chest: Effort normal and breath sounds normal. No respiratory distress. She has no wheezes. She has no rales.    Abdominal: Soft. Bowel sounds are normal. She exhibits no distension. There is no tenderness. There is no rebound and no guarding.  Skin:           Assessment & Plan:

## 2013-12-12 NOTE — Assessment & Plan Note (Signed)
Lipids:    Component Value Date/Time   CHOL 105 01/04/2013 1143   TRIG 263* 01/04/2013 1143   HDL 31* 01/04/2013 1143   LDLCALC 21 01/04/2013 1143   VLDL 53* 01/04/2013 1143   CHOLHDL 3.4 01/04/2013 1143    Assessment: Patient is doing well was no evident side effects on on simvastatin 20 mg daily.  Plan: Continue simvastatin 20 mg daily; check a lipid panel today.

## 2013-12-12 NOTE — Assessment & Plan Note (Signed)
SpO2 Readings from Last 3 Encounters:  12/12/13 100%  12/01/13 100%  11/09/13 95%    Assessment: Patient reports that she uses her CPAP regularly.  She also reports that she has not been using nocturnal oxygen for about a year, and she asked if the oxygen can be discontinued.  Plan: Continue CPAP; request overnight oximetry on room air to assess her need for oxygen.

## 2013-12-12 NOTE — Assessment & Plan Note (Signed)
Lab Results  Component Value Date   CREATININE 1.47* 08/11/2013   CREATININE 1.49* 06/09/2013   CREATININE 1.58* 04/26/2013   CREATININE 1.84* 01/04/2013   CREATININE 1.64* 09/07/2012   CREATININE 1.76* 06/22/2012     Assessment: Patient has stable chronic kidney disease and is followed by nephrologist Dr. Posey Pronto.  She is getting regular Procrit injections managed by Dr. Posey Pronto  Plan: Check a comprehensive metabolic panel and CBC with differential today.  Patient will followup with Dr. Posey Pronto.

## 2013-12-14 ENCOUNTER — Other Ambulatory Visit (HOSPITAL_COMMUNITY): Payer: Self-pay

## 2013-12-15 ENCOUNTER — Encounter (HOSPITAL_COMMUNITY): Payer: PRIVATE HEALTH INSURANCE

## 2013-12-21 ENCOUNTER — Encounter (HOSPITAL_COMMUNITY)
Admission: RE | Admit: 2013-12-21 | Discharge: 2013-12-21 | Disposition: A | Discharge status: Home / Self Care | Payer: PRIVATE HEALTH INSURANCE | Source: Ambulatory Visit | Attending: Nephrology | Admitting: Nephrology

## 2013-12-21 DIAGNOSIS — D631 Anemia in chronic kidney disease: Secondary | ICD-10-CM | POA: Diagnosis not present

## 2013-12-21 DIAGNOSIS — N184 Chronic kidney disease, stage 4 (severe): Secondary | ICD-10-CM | POA: Diagnosis not present

## 2013-12-21 LAB — IRON AND TIBC
IRON: 83 ug/dL (ref 42–135)
Saturation Ratios: 43 % (ref 20–55)
TIBC: 193 ug/dL — ABNORMAL LOW (ref 250–470)
UIBC: 110 ug/dL — AB (ref 125–400)

## 2013-12-21 LAB — RENAL FUNCTION PANEL
ALBUMIN: 3.3 g/dL — AB (ref 3.5–5.2)
Anion gap: 15 (ref 5–15)
BUN: 26 mg/dL — AB (ref 6–23)
CALCIUM: 9.9 mg/dL (ref 8.4–10.5)
CO2: 23 mEq/L (ref 19–32)
Chloride: 105 mEq/L (ref 96–112)
Creatinine, Ser: 1.91 mg/dL — ABNORMAL HIGH (ref 0.50–1.10)
GFR calc Af Amer: 30 mL/min — ABNORMAL LOW (ref >90)
GFR calc non Af Amer: 26 mL/min — ABNORMAL LOW (ref >90)
Glucose, Bld: 206 mg/dL — ABNORMAL HIGH (ref 70–99)
PHOSPHORUS: 2.5 mg/dL (ref 2.3–4.6)
Potassium: 4.4 mEq/L (ref 3.7–5.3)
Sodium: 143 mEq/L (ref 137–147)

## 2013-12-21 LAB — FERRITIN: Ferritin: 874 ng/mL — ABNORMAL HIGH (ref 10–291)

## 2013-12-21 LAB — MAGNESIUM: Magnesium: 1.9 mg/dL (ref 1.5–2.5)

## 2013-12-21 LAB — POCT HEMOGLOBIN-HEMACUE: Hemoglobin: 9.7 g/dL — ABNORMAL LOW (ref 12.0–15.0)

## 2013-12-21 MED ORDER — CLONIDINE HCL 0.1 MG PO TABS: 0.1000 mg | ORAL_TABLET | ORAL | Status: DC | PRN

## 2013-12-21 MED ORDER — EPOETIN ALFA 10000 UNIT/ML IJ SOLN
INTRAMUSCULAR | Status: AC
  Administered 2013-12-21: 10000 [IU] via SUBCUTANEOUS
  Filled 2013-12-21: qty 1

## 2013-12-21 MED ORDER — EPOETIN ALFA 20000 UNIT/ML IJ SOLN
INTRAMUSCULAR | Status: AC
  Administered 2013-12-21: 20000 [IU] via SUBCUTANEOUS
  Filled 2013-12-21: qty 1

## 2013-12-21 MED ORDER — EPOETIN ALFA 40000 UNIT/ML IJ SOLN: 30000.0000 [IU] | INTRAMUSCULAR | Status: DC

## 2013-12-22 LAB — PTH, INTACT AND CALCIUM
Calcium, Total (PTH): 9.8 mg/dL (ref 8.4–10.5)
PTH: 167 pg/mL — AB (ref 14–64)

## 2013-12-22 LAB — VITAMIN D 25 HYDROXY (VIT D DEFICIENCY, FRACTURES): VIT D 25 HYDROXY: 26 ng/mL — AB (ref 30–100)

## 2013-12-29 ENCOUNTER — Other Ambulatory Visit: Payer: Self-pay | Admitting: Internal Medicine

## 2014-01-01 NOTE — Addendum Note (Signed)
Addended by: Hulan Fray on: 01/01/2014 03:32 PM   Modules accepted: Orders

## 2014-01-02 NOTE — Telephone Encounter (Signed)
Refill approved - nurse to call in. 

## 2014-01-02 NOTE — Telephone Encounter (Signed)
Rx called in to pharmacy. Hilda Blades Allister Lessley RN 01/02/14 4:45PM

## 2014-01-05 ENCOUNTER — Encounter (HOSPITAL_COMMUNITY)
Admission: RE | Admit: 2014-01-05 | Discharge: 2014-01-05 | Disposition: A | Discharge status: Home / Self Care | Payer: PRIVATE HEALTH INSURANCE | Source: Ambulatory Visit | Attending: Nephrology | Admitting: Nephrology

## 2014-01-05 DIAGNOSIS — N184 Chronic kidney disease, stage 4 (severe): Secondary | ICD-10-CM | POA: Insufficient documentation

## 2014-01-05 DIAGNOSIS — D631 Anemia in chronic kidney disease: Secondary | ICD-10-CM | POA: Insufficient documentation

## 2014-01-05 LAB — POCT HEMOGLOBIN-HEMACUE: Hemoglobin: 9.9 g/dL — ABNORMAL LOW (ref 12.0–15.0)

## 2014-01-05 MED ORDER — CLONIDINE HCL 0.1 MG PO TABS: 0.1000 mg | ORAL_TABLET | ORAL | Status: DC | PRN

## 2014-01-05 MED ORDER — EPOETIN ALFA 40000 UNIT/ML IJ SOLN
30000.0000 [IU] | INTRAMUSCULAR | Status: DC
  Administered 2014-01-05: 30000 [IU] via SUBCUTANEOUS

## 2014-01-19 ENCOUNTER — Encounter (HOSPITAL_COMMUNITY)
Admission: RE | Admit: 2014-01-19 | Discharge: 2014-01-19 | Disposition: A | Discharge status: Home / Self Care | Payer: PRIVATE HEALTH INSURANCE | Source: Ambulatory Visit | Attending: Nephrology | Admitting: Nephrology

## 2014-01-19 DIAGNOSIS — D631 Anemia in chronic kidney disease: Secondary | ICD-10-CM | POA: Diagnosis not present

## 2014-01-19 LAB — POCT HEMOGLOBIN-HEMACUE: HEMOGLOBIN: 10.8 g/dL — AB (ref 12.0–15.0)

## 2014-01-19 MED ORDER — EPOETIN ALFA 40000 UNIT/ML IJ SOLN: 30000.0000 [IU] | INTRAMUSCULAR | Status: DC

## 2014-01-19 MED ORDER — EPOETIN ALFA 20000 UNIT/ML IJ SOLN
INTRAMUSCULAR | Status: AC
  Administered 2014-01-19: 20000 [IU] via SUBCUTANEOUS
  Filled 2014-01-19: qty 1

## 2014-01-19 MED ORDER — EPOETIN ALFA 10000 UNIT/ML IJ SOLN
INTRAMUSCULAR | Status: AC
  Administered 2014-01-19: 10000 [IU] via SUBCUTANEOUS
  Filled 2014-01-19: qty 1

## 2014-02-02 DIAGNOSIS — G4733 Obstructive sleep apnea (adult) (pediatric): Secondary | ICD-10-CM | POA: Diagnosis not present

## 2014-02-06 ENCOUNTER — Encounter (HOSPITAL_COMMUNITY)
Admission: RE | Admit: 2014-02-06 | Discharge: 2014-02-06 | Disposition: A | Discharge status: Home / Self Care | Payer: Medicare Other | Source: Ambulatory Visit | Attending: Nephrology | Admitting: Nephrology

## 2014-02-06 DIAGNOSIS — N184 Chronic kidney disease, stage 4 (severe): Secondary | ICD-10-CM | POA: Diagnosis not present

## 2014-02-06 DIAGNOSIS — D631 Anemia in chronic kidney disease: Secondary | ICD-10-CM | POA: Diagnosis not present

## 2014-02-06 LAB — POCT HEMOGLOBIN-HEMACUE: HEMOGLOBIN: 10.8 g/dL — AB (ref 12.0–15.0)

## 2014-02-06 MED ORDER — EPOETIN ALFA 10000 UNIT/ML IJ SOLN
INTRAMUSCULAR | Status: AC
  Administered 2014-02-06: 10000 [IU] via SUBCUTANEOUS
  Filled 2014-02-06: qty 1

## 2014-02-06 MED ORDER — EPOETIN ALFA 40000 UNIT/ML IJ SOLN: 30000.0000 [IU] | INTRAMUSCULAR | Status: DC

## 2014-02-06 MED ORDER — EPOETIN ALFA 20000 UNIT/ML IJ SOLN
INTRAMUSCULAR | Status: AC
  Administered 2014-02-06: 20000 [IU] via SUBCUTANEOUS
  Filled 2014-02-06: qty 1

## 2014-02-06 MED ORDER — CLONIDINE HCL 0.1 MG PO TABS: 0.1000 mg | ORAL_TABLET | ORAL | Status: DC | PRN

## 2014-02-14 ENCOUNTER — Telehealth: Payer: Self-pay | Admitting: *Deleted

## 2014-02-14 DIAGNOSIS — E119 Type 2 diabetes mellitus without complications: Secondary | ICD-10-CM

## 2014-02-14 NOTE — Telephone Encounter (Signed)
Pt called - needs new meter - wants Accu chek compack complete plus meter sent to [facility]. Hilda Blades Kayal Mula RN 02/14/14 1:45pm

## 2014-02-18 MED ORDER — ACCU-CHEK COMPACT PLUS CARE KIT: PACK | Status: DC

## 2014-02-23 ENCOUNTER — Inpatient Hospital Stay (HOSPITAL_COMMUNITY): Admission: RE | Admit: 2014-02-23 | Payer: Medicare Other | Source: Ambulatory Visit

## 2014-02-26 ENCOUNTER — Other Ambulatory Visit: Payer: Self-pay | Admitting: Internal Medicine

## 2014-03-01 ENCOUNTER — Encounter (HOSPITAL_COMMUNITY): Payer: Medicare Other

## 2014-03-01 ENCOUNTER — Other Ambulatory Visit (HOSPITAL_COMMUNITY): Payer: Self-pay | Admitting: *Deleted

## 2014-03-02 ENCOUNTER — Encounter (HOSPITAL_COMMUNITY)
Admission: RE | Admit: 2014-03-02 | Discharge: 2014-03-02 | Disposition: A | Discharge status: Home / Self Care | Payer: Medicare Other | Source: Ambulatory Visit | Attending: Nephrology | Admitting: Nephrology

## 2014-03-02 DIAGNOSIS — D631 Anemia in chronic kidney disease: Secondary | ICD-10-CM | POA: Diagnosis not present

## 2014-03-02 DIAGNOSIS — N184 Chronic kidney disease, stage 4 (severe): Secondary | ICD-10-CM | POA: Diagnosis not present

## 2014-03-02 LAB — IRON AND TIBC
IRON: 54 ug/dL (ref 42–145)
Saturation Ratios: 27 % (ref 20–55)
TIBC: 203 ug/dL — ABNORMAL LOW (ref 250–470)
UIBC: 149 ug/dL (ref 125–400)

## 2014-03-02 LAB — FERRITIN: Ferritin: 973 ng/mL — ABNORMAL HIGH (ref 10–291)

## 2014-03-02 LAB — POCT HEMOGLOBIN-HEMACUE: HEMOGLOBIN: 9.7 g/dL — AB (ref 12.0–15.0)

## 2014-03-02 MED ORDER — EPOETIN ALFA 20000 UNIT/ML IJ SOLN
INTRAMUSCULAR | Status: AC
  Administered 2014-03-02: 20000 [IU]
  Filled 2014-03-02: qty 1

## 2014-03-02 MED ORDER — CLONIDINE HCL 0.1 MG PO TABS: 0.1000 mg | ORAL_TABLET | ORAL | Status: DC | PRN

## 2014-03-02 MED ORDER — EPOETIN ALFA 10000 UNIT/ML IJ SOLN
INTRAMUSCULAR | Status: AC
  Administered 2014-03-02: 10000 [IU]
  Filled 2014-03-02: qty 1

## 2014-03-02 MED ORDER — EPOETIN ALFA 40000 UNIT/ML IJ SOLN: 30000.0000 [IU] | INTRAMUSCULAR | Status: DC

## 2014-03-05 DIAGNOSIS — G4733 Obstructive sleep apnea (adult) (pediatric): Secondary | ICD-10-CM | POA: Diagnosis not present

## 2014-03-08 ENCOUNTER — Telehealth: Payer: Self-pay | Admitting: *Deleted

## 2014-03-08 DIAGNOSIS — N183 Chronic kidney disease, stage 3 unspecified: Secondary | ICD-10-CM

## 2014-03-09 MED ORDER — FUROSEMIDE 40 MG PO TABS: 60.0000 mg | ORAL_TABLET | Freq: Every day | ORAL | Status: DC

## 2014-03-09 MED ORDER — SITAGLIPTIN PHOSPHATE 50 MG PO TABS: 50.0000 mg | ORAL_TABLET | Freq: Every day | ORAL | Status: DC

## 2014-03-09 NOTE — Telephone Encounter (Signed)
Please schedule a follow-up appointment with me in March.  Please have patient come in next week for a basic metabolic panel.

## 2014-03-12 ENCOUNTER — Other Ambulatory Visit: Payer: Self-pay | Admitting: Internal Medicine

## 2014-03-16 ENCOUNTER — Encounter (HOSPITAL_COMMUNITY): Payer: Medicare Other

## 2014-03-23 ENCOUNTER — Other Ambulatory Visit (INDEPENDENT_AMBULATORY_CARE_PROVIDER_SITE_OTHER): Payer: Medicare Other

## 2014-03-23 ENCOUNTER — Encounter (HOSPITAL_COMMUNITY)
Admission: RE | Admit: 2014-03-23 | Discharge: 2014-03-23 | Disposition: A | Discharge status: Home / Self Care | Payer: Medicare Other | Source: Ambulatory Visit | Attending: Nephrology | Admitting: Nephrology

## 2014-03-23 DIAGNOSIS — D631 Anemia in chronic kidney disease: Secondary | ICD-10-CM | POA: Diagnosis not present

## 2014-03-23 DIAGNOSIS — N184 Chronic kidney disease, stage 4 (severe): Secondary | ICD-10-CM | POA: Diagnosis not present

## 2014-03-23 DIAGNOSIS — N183 Chronic kidney disease, stage 3 unspecified: Secondary | ICD-10-CM

## 2014-03-23 LAB — BASIC METABOLIC PANEL WITH GFR
BUN: 31 mg/dL — AB (ref 6–23)
CO2: 25 mEq/L (ref 19–32)
Calcium: 10 mg/dL (ref 8.4–10.5)
Chloride: 109 mEq/L (ref 96–112)
Creat: 1.93 mg/dL — ABNORMAL HIGH (ref 0.50–1.10)
GFR, EST AFRICAN AMERICAN: 30 mL/min — AB
GFR, EST NON AFRICAN AMERICAN: 26 mL/min — AB
GLUCOSE: 163 mg/dL — AB (ref 70–99)
Potassium: 4.8 mEq/L (ref 3.5–5.3)
Sodium: 142 mEq/L (ref 135–145)

## 2014-03-23 LAB — POCT HEMOGLOBIN-HEMACUE: Hemoglobin: 10 g/dL — ABNORMAL LOW (ref 12.0–15.0)

## 2014-03-23 MED ORDER — EPOETIN ALFA 40000 UNIT/ML IJ SOLN: 30000.0000 [IU] | INTRAMUSCULAR | Status: DC

## 2014-03-23 MED ORDER — CLONIDINE HCL 0.1 MG PO TABS: 0.1000 mg | ORAL_TABLET | ORAL | Status: DC | PRN

## 2014-03-24 MED ORDER — EPOETIN ALFA 10000 UNIT/ML IJ SOLN
INTRAMUSCULAR | Status: AC
  Administered 2014-03-23: 10000 [IU] via SUBCUTANEOUS
  Filled 2014-03-24: qty 1

## 2014-03-24 MED ORDER — EPOETIN ALFA 20000 UNIT/ML IJ SOLN
INTRAMUSCULAR | Status: AC
  Administered 2014-03-23: 20000 [IU] via SUBCUTANEOUS
  Filled 2014-03-24: qty 1

## 2014-03-25 ENCOUNTER — Other Ambulatory Visit: Payer: Self-pay | Admitting: Internal Medicine

## 2014-04-03 ENCOUNTER — Other Ambulatory Visit: Payer: Self-pay | Admitting: Internal Medicine

## 2014-04-03 DIAGNOSIS — G4733 Obstructive sleep apnea (adult) (pediatric): Secondary | ICD-10-CM | POA: Diagnosis not present

## 2014-04-04 NOTE — Telephone Encounter (Signed)
Rx called in 

## 2014-04-04 NOTE — Telephone Encounter (Signed)
Patient has appointment 05/01/2014. Refill approved - nurse to call in.   (Note: I unintentionally printed the prescription, and destroyed it.)

## 2014-04-05 ENCOUNTER — Ambulatory Visit: Payer: Medicare Other | Admitting: Internal Medicine

## 2014-04-06 ENCOUNTER — Encounter (HOSPITAL_COMMUNITY): Payer: Medicare Other

## 2014-04-12 ENCOUNTER — Encounter (HOSPITAL_COMMUNITY)
Admission: RE | Admit: 2014-04-12 | Discharge: 2014-04-12 | Disposition: A | Discharge status: Home / Self Care | Payer: Medicare Other | Source: Ambulatory Visit | Attending: Nephrology | Admitting: Nephrology

## 2014-04-12 DIAGNOSIS — D631 Anemia in chronic kidney disease: Secondary | ICD-10-CM | POA: Diagnosis not present

## 2014-04-12 DIAGNOSIS — N184 Chronic kidney disease, stage 4 (severe): Secondary | ICD-10-CM | POA: Diagnosis not present

## 2014-04-12 MED ORDER — EPOETIN ALFA 40000 UNIT/ML IJ SOLN
30000.0000 [IU] | INTRAMUSCULAR | Status: DC
  Administered 2014-04-12: 30000 [IU] via SUBCUTANEOUS

## 2014-04-12 MED ORDER — CLONIDINE HCL 0.1 MG PO TABS: 0.1000 mg | ORAL_TABLET | ORAL | Status: DC | PRN

## 2014-04-13 LAB — POCT HEMOGLOBIN-HEMACUE: Hemoglobin: 8.4 g/dL — ABNORMAL LOW (ref 12.0–15.0)

## 2014-04-13 MED ORDER — EPOETIN ALFA 10000 UNIT/ML IJ SOLN
INTRAMUSCULAR | Status: AC
  Filled 2014-04-13: qty 1

## 2014-04-13 MED ORDER — EPOETIN ALFA 20000 UNIT/ML IJ SOLN
INTRAMUSCULAR | Status: AC
  Filled 2014-04-13: qty 1

## 2014-04-18 ENCOUNTER — Encounter: Payer: Self-pay | Admitting: *Deleted

## 2014-04-20 ENCOUNTER — Other Ambulatory Visit: Payer: Self-pay | Admitting: Internal Medicine

## 2014-04-24 ENCOUNTER — Other Ambulatory Visit: Payer: Self-pay | Admitting: Internal Medicine

## 2014-04-25 ENCOUNTER — Other Ambulatory Visit: Payer: Self-pay | Admitting: Internal Medicine

## 2014-04-26 ENCOUNTER — Inpatient Hospital Stay (HOSPITAL_COMMUNITY): Admission: RE | Admit: 2014-04-26 | Payer: Medicare Other | Source: Ambulatory Visit

## 2014-04-30 ENCOUNTER — Inpatient Hospital Stay (HOSPITAL_COMMUNITY): Admission: RE | Admit: 2014-04-30 | Payer: Medicare Other | Source: Ambulatory Visit

## 2014-04-30 ENCOUNTER — Telehealth: Payer: Self-pay | Admitting: Dietician

## 2014-04-30 NOTE — Telephone Encounter (Signed)
Called patient to ask if she goes and/or is supposed  to go to an eye doctor regularly(she is legally blind). She was not opposed to going, but says she needs more assistance because her husband can no longer go with her and she needs help getting in and out of the buildings. She has tried to call sergices of the blind and has not been abel to get in touch with anyone who can assist. She agreed for CDE to request social work assistance.

## 2014-05-01 ENCOUNTER — Encounter: Payer: Self-pay | Admitting: Licensed Clinical Social Worker

## 2014-05-01 ENCOUNTER — Encounter (HOSPITAL_COMMUNITY)
Admission: RE | Admit: 2014-05-01 | Discharge: 2014-05-01 | Disposition: A | Discharge status: Home / Self Care | Payer: Medicare Other | Source: Ambulatory Visit | Attending: Nephrology | Admitting: Nephrology

## 2014-05-01 ENCOUNTER — Ambulatory Visit (INDEPENDENT_AMBULATORY_CARE_PROVIDER_SITE_OTHER): Payer: Medicare Other | Admitting: Internal Medicine

## 2014-05-01 ENCOUNTER — Encounter: Payer: Self-pay | Admitting: Internal Medicine

## 2014-05-01 VITALS — BP 144/70 | HR 77 | Temp 98.2°F | Wt 253.1 lb

## 2014-05-01 DIAGNOSIS — M1990 Unspecified osteoarthritis, unspecified site: Secondary | ICD-10-CM

## 2014-05-01 DIAGNOSIS — Z23 Encounter for immunization: Secondary | ICD-10-CM | POA: Diagnosis not present

## 2014-05-01 DIAGNOSIS — D631 Anemia in chronic kidney disease: Secondary | ICD-10-CM | POA: Diagnosis not present

## 2014-05-01 DIAGNOSIS — G8929 Other chronic pain: Secondary | ICD-10-CM

## 2014-05-01 DIAGNOSIS — Z9989 Dependence on other enabling machines and devices: Secondary | ICD-10-CM

## 2014-05-01 DIAGNOSIS — M159 Polyosteoarthritis, unspecified: Secondary | ICD-10-CM

## 2014-05-01 DIAGNOSIS — Z79899 Other long term (current) drug therapy: Secondary | ICD-10-CM

## 2014-05-01 DIAGNOSIS — N189 Chronic kidney disease, unspecified: Secondary | ICD-10-CM

## 2014-05-01 DIAGNOSIS — E118 Type 2 diabetes mellitus with unspecified complications: Secondary | ICD-10-CM

## 2014-05-01 DIAGNOSIS — N183 Chronic kidney disease, stage 3 unspecified: Secondary | ICD-10-CM

## 2014-05-01 DIAGNOSIS — J449 Chronic obstructive pulmonary disease, unspecified: Secondary | ICD-10-CM

## 2014-05-01 DIAGNOSIS — Z780 Asymptomatic menopausal state: Secondary | ICD-10-CM | POA: Insufficient documentation

## 2014-05-01 DIAGNOSIS — E1122 Type 2 diabetes mellitus with diabetic chronic kidney disease: Secondary | ICD-10-CM

## 2014-05-01 DIAGNOSIS — N184 Chronic kidney disease, stage 4 (severe): Secondary | ICD-10-CM | POA: Diagnosis not present

## 2014-05-01 DIAGNOSIS — E559 Vitamin D deficiency, unspecified: Secondary | ICD-10-CM | POA: Insufficient documentation

## 2014-05-01 DIAGNOSIS — Z7951 Long term (current) use of inhaled steroids: Secondary | ICD-10-CM

## 2014-05-01 DIAGNOSIS — Z794 Long term (current) use of insulin: Secondary | ICD-10-CM

## 2014-05-01 DIAGNOSIS — I10 Essential (primary) hypertension: Secondary | ICD-10-CM

## 2014-05-01 DIAGNOSIS — I129 Hypertensive chronic kidney disease with stage 1 through stage 4 chronic kidney disease, or unspecified chronic kidney disease: Secondary | ICD-10-CM | POA: Diagnosis not present

## 2014-05-01 DIAGNOSIS — G4730 Sleep apnea, unspecified: Secondary | ICD-10-CM

## 2014-05-01 DIAGNOSIS — E785 Hyperlipidemia, unspecified: Secondary | ICD-10-CM

## 2014-05-01 LAB — COMPLETE METABOLIC PANEL WITH GFR
ALK PHOS: 73 U/L (ref 39–117)
ALT: 12 U/L (ref 0–35)
AST: 14 U/L (ref 0–37)
Albumin: 3.7 g/dL (ref 3.5–5.2)
BUN: 28 mg/dL — ABNORMAL HIGH (ref 6–23)
CALCIUM: 10.7 mg/dL — AB (ref 8.4–10.5)
CO2: 26 meq/L (ref 19–32)
Chloride: 109 mEq/L (ref 96–112)
Creat: 1.68 mg/dL — ABNORMAL HIGH (ref 0.50–1.10)
GFR, EST AFRICAN AMERICAN: 35 mL/min — AB
GFR, Est Non African American: 31 mL/min — ABNORMAL LOW
Glucose, Bld: 108 mg/dL — ABNORMAL HIGH (ref 70–99)
POTASSIUM: 4.2 meq/L (ref 3.5–5.3)
SODIUM: 143 meq/L (ref 135–145)
TOTAL PROTEIN: 7.9 g/dL (ref 6.0–8.3)
Total Bilirubin: 0.2 mg/dL (ref 0.2–1.2)

## 2014-05-01 LAB — GLUCOSE, CAPILLARY: Glucose-Capillary: 140 mg/dL — ABNORMAL HIGH (ref 70–99)

## 2014-05-01 LAB — POCT HEMOGLOBIN-HEMACUE: HEMOGLOBIN: 8.8 g/dL — AB (ref 12.0–15.0)

## 2014-05-01 LAB — POCT GLYCOSYLATED HEMOGLOBIN (HGB A1C): HEMOGLOBIN A1C: 7.5

## 2014-05-01 MED ORDER — INSULIN GLARGINE 100 UNIT/ML SOLOSTAR PEN: PEN_INJECTOR | SUBCUTANEOUS | Status: DC

## 2014-05-01 MED ORDER — FUROSEMIDE 40 MG PO TABS: 40.0000 mg | ORAL_TABLET | Freq: Every day | ORAL | Status: DC

## 2014-05-01 MED ORDER — IPRATROPIUM-ALBUTEROL 18-103 MCG/ACT IN AERO: 2.0000 | INHALATION_SPRAY | Freq: Four times a day (QID) | RESPIRATORY_TRACT | Status: DC

## 2014-05-01 MED ORDER — EPOETIN ALFA 20000 UNIT/ML IJ SOLN
INTRAMUSCULAR | Status: AC
  Administered 2014-05-01: 20000 [IU] via SUBCUTANEOUS
  Filled 2014-05-01: qty 1

## 2014-05-01 MED ORDER — SIMVASTATIN 20 MG PO TABS: 20.0000 mg | ORAL_TABLET | Freq: Every day | ORAL | Status: DC

## 2014-05-01 MED ORDER — EPOETIN ALFA 40000 UNIT/ML IJ SOLN: 30000.0000 [IU] | INTRAMUSCULAR | Status: DC

## 2014-05-01 MED ORDER — ACETAMINOPHEN-CODEINE #3 300-30 MG PO TABS: 1.0000 | ORAL_TABLET | Freq: Two times a day (BID) | ORAL | Status: DC | PRN

## 2014-05-01 MED ORDER — EPOETIN ALFA 10000 UNIT/ML IJ SOLN
INTRAMUSCULAR | Status: AC
  Administered 2014-05-01: 10000 [IU] via SUBCUTANEOUS
  Filled 2014-05-01: qty 1

## 2014-05-01 MED ORDER — INSULIN PEN NEEDLE 32G X 6 MM MISC: Status: DC

## 2014-05-01 MED ORDER — SITAGLIPTIN PHOSPHATE 50 MG PO TABS: 50.0000 mg | ORAL_TABLET | Freq: Every day | ORAL | Status: DC

## 2014-05-01 MED ORDER — VITAMIN D3 25 MCG (1000 UT) PO CAPS: 1.0000 | ORAL_CAPSULE | Freq: Every day | ORAL | Status: DC

## 2014-05-01 NOTE — Assessment & Plan Note (Signed)
VIT D, 25-HYDROXY  Date Value Ref Range Status  12/21/2013 26* 30 - 100 ng/mL Final    Assessment: Vitamin D level was low at 26 when checked in November 2015.  Plan: Treat with vitamin D3 1000 units daily.

## 2014-05-01 NOTE — Progress Notes (Signed)
   Subjective:    Patient ID: Joan Banks, female    DOB: 29-Sep-1945, 69 y.o.   MRN: [id number]  HPI Patient returns for management of her type 2 diabetes mellitus, COPD, hypertension, hyperlipidemia, and other chronic medical problems.  She has no acute complaints.  She reports that she has been compliant with her medications.  She adjusted her furosemide dose down from 60 mg daily to 40 mg daily on her own.  Her home glucose meter record shows an average blood sugar 158 over the past month, with a range from 87-272.  Patient reports occasional symptoms of hypoglycemia when she skips a meal, and says that the symptoms resolve when she eats a snack.  She reports no recent breathing problems, and says that she is doing well on her current inhaled bronchodilator regimen.  She reports that she is using her CPAP regularly.  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   Review of Systems  Constitutional: Negative for fever, chills and diaphoresis.  Respiratory: Negative for cough, shortness of breath and wheezing.   Cardiovascular: Positive for leg swelling (Mild bilateral leg edema, chronic). Negative for chest pain.  Gastrointestinal: Negative for nausea, vomiting, abdominal pain, diarrhea and blood in stool.  Genitourinary: Negative for dysuria and frequency.  Neurological: Negative for dizziness and syncope.       Objective:   Physical Exam  Constitutional: No distress.  Cardiovascular: Normal rate, regular rhythm and normal heart sounds.  Exam reveals no gallop and no friction rub.   No murmur heard. Trace bilateral leg edema  Pulmonary/Chest: Effort normal and breath sounds normal. No respiratory distress. She has no wheezes. She has no rales.  Abdominal: Soft. Bowel sounds are normal. She exhibits no distension. There is no tenderness. There is no rebound and no guarding.       Assessment & Plan:

## 2014-05-01 NOTE — Assessment & Plan Note (Addendum)
Assessment: Patient has no symptoms of anemia.  She is currently receiving Procrit injections managed by her nephrologist Dr. Posey Pronto, and also is taking iron supplementation with ferrous sulfate 325 mg twice a day.  Plan: Continue current management by Dr. Posey Pronto.Marland Kitchen

## 2014-05-01 NOTE — Assessment & Plan Note (Signed)
BP Readings from Last 3 Encounters:  05/01/14 142/58  05/01/14 144/70  04/12/14 176/63    Lab Results  Component Value Date   NA 143 05/01/2014   K 4.2 05/01/2014   CREATININE 1.68* 05/01/2014    Assessment: Blood pressure control: mildly elevated Progress toward BP goal:  improved Comments: Blood pressure is essentially at goal on amlodipine 10 mg daily, atenolol 50 mg twice a day, clonidine 0.3 mg twice a day, enalapril 20 mg twice a day, and furosemide 40 mg daily.  Plan: Medications:  continue current medications Educational resources provided: brochure

## 2014-05-01 NOTE — Assessment & Plan Note (Signed)
Lipids:    Component Value Date/Time   CHOL 102 12/12/2013 1228   TRIG 186* 12/12/2013 1228   HDL 34* 12/12/2013 1228   LDLCALC 31 12/12/2013 1228   VLDL 37 12/12/2013 1228   CHOLHDL 3.0 12/12/2013 1228    Assessment: LDL is at goal on simvastatin 20 mg daily.  Plan: Continue simvastatin 20 mg daily.

## 2014-05-01 NOTE — Assessment & Plan Note (Addendum)
Assessment: Patient has DJD and chronic pain which is well controlled with Tylenol 3 one tablet twice a day on an as-needed basis.  Patient does not use the Tylenol 3 regularly.  Plan: Continue Tylenol 3 one tablet twice a day as needed for pain.

## 2014-05-01 NOTE — Patient Instructions (Signed)
Start Vitamin D3 1,000 units take 1 capsule daily. A referral has been made for a DXA bone density scan to screen for osteoporosis. Please schedule an appointment with your eye doctor for eye exam.

## 2014-05-01 NOTE — Assessment & Plan Note (Signed)
Assessment: Patient has stable chronic kidney disease and is followed by nephrologist Dr. Posey Pronto.  She is getting regular Procrit injections managed by Dr. Posey Pronto  Plan: Check a comprehensive metabolic panel.  Patient will followup with Dr. Posey Pronto.

## 2014-05-01 NOTE — Assessment & Plan Note (Signed)
SpO2 Readings from Last 3 Encounters:  05/01/14 97%  04/12/14 100%  03/02/14 100%    Assessment: Patient reports that she uses her CPAP regularly.  She feels that she needs a new CPAP machine.  Plan: Contact her CPAP provider ([facility]) to find out if she needs a new CPAP machine and whether a repeat sleep study is needed; request overnight oximetry on room air to assess her need for oxygen.

## 2014-05-01 NOTE — Assessment & Plan Note (Signed)
Lab Results  Component Value Date   HGBA1C 7.5 05/01/2014   HGBA1C 7.4 12/12/2013   HGBA1C 6.7 08/09/2013     Assessment: Diabetes control: fair control Progress toward A1C goal:  unchanged Comments: Hemoglobin A1c is a little above goal; however, given her reported episodes of symptomatic hypoglycemia, I am reluctant to increase her regimen at this time.  Plan: Medications:  Continue Lantus insulin 45 units at bedtime, Glucotrol XL 20 mg daily, and Januvia 50 mg daily Home glucose monitoring: Frequency: 2 times a day Timing: before breakfast, before dinner Instruction/counseling given: discussed diet Educational resources provided: brochure Self management tools provided: copy of home glucose meter download Other plans: I advised patient not to skip meals but to eat regularly in order to avoid hypoglycemia.

## 2014-05-01 NOTE — Assessment & Plan Note (Signed)
SpO2 Readings from Last 3 Encounters:  05/01/14 97%  04/12/14 100%  03/02/14 100%    Assessment: Patient is doing well on Combivent inhaler.    Plan: Continue current inhaler regimen; reorder overnight oximetry to assess need for nocturnal oxygen.

## 2014-05-02 LAB — FERRITIN: Ferritin: 1034 ng/mL — ABNORMAL HIGH (ref 10–291)

## 2014-05-02 LAB — IRON AND TIBC
Iron: 46 ug/dL (ref 42–145)
SATURATION RATIOS: 22 % (ref 20–55)
TIBC: 211 ug/dL — AB (ref 250–470)
UIBC: 165 ug/dL (ref 125–400)

## 2014-05-02 NOTE — Progress Notes (Signed)
Ms. Joan Banks voiced concern to CDE of difficulty getting to/from locations she is unfamiliar with due to her limited vision and debilitating health of spouse.  Ms. Joan Banks is legally blind and has Medicare/Medicaid.  Pt has had PCS in the past, but had bad experiences.  Ms. Joan Banks states she is mostly independent with her ADL's, just takes increased time to complete and housekeeping has been limited.  Pt has left message with [employer] [employer] but has yet to obtain a response.  CSW sent email to Director of Social Workers for the blind, regarding companion care services.  Ms. Joan Banks would prefer someone to attend medical appointments with her when she is going to a new place.  CSW is unaware of any companion programs available at this time, but Ms. Joan Banks would be notified if CSW becomes aware.  Inquiry sent to [employer] [facility] [place].  Ms. Joan Banks states she will notify [facility] when she would like PCS assistance, but at this time she is not interested.

## 2014-05-03 NOTE — Progress Notes (Signed)
CSW received return message from SW for the [place].  Services available would be education to assist pt to live independently, pt would need recent eye exam for referral.  CSW placed call to Ms. Bowery on 05/03/14 at 0923 to discuss referral to [facility] and services available.  Pt informed referral can be made; however, pt would need recent eye exam.  Ms. Cumpton declines referral stating the services are not quite what she was looking for at this time.  CSW will sign off.

## 2014-05-04 DIAGNOSIS — G4733 Obstructive sleep apnea (adult) (pediatric): Secondary | ICD-10-CM | POA: Diagnosis not present

## 2014-05-09 ENCOUNTER — Telehealth: Payer: Self-pay | Admitting: Internal Medicine

## 2014-05-09 DIAGNOSIS — G4733 Obstructive sleep apnea (adult) (pediatric): Secondary | ICD-10-CM | POA: Diagnosis not present

## 2014-05-09 DIAGNOSIS — E559 Vitamin D deficiency, unspecified: Secondary | ICD-10-CM

## 2014-05-09 NOTE — Telephone Encounter (Signed)
Telephone Contact Note  I started vitamin D3 replacement on 05/01/14 given a low vitamin D level of 26 in November 2015, and a previous low level of 17 about 11 months ago.  However, patient's calcium level on 05/01/2014 was mildly elevated at 10.7.  I called patient's nephrologist Dr. 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  He advised continuing the vitamin D at this dose, and monitoring her calcium level to make sure that is not getting too high.  According to Dr. Posey Pronto, patient is scheduled to see him week after next.  I called patient to discuss this with her.  She has not yet had the vitamin D3 prescription filled or started taking it.  Given this, I advised her to hold off on starting it until she sees Dr. Posey Pronto in his office.  I will also request a follow-up appointment in our clinic in 6 weeks.

## 2014-05-09 NOTE — Assessment & Plan Note (Signed)
Telephone Contact Note  I started vitamin D3 replacement on 05/01/14 given a low vitamin D level of 26 in November 2015, and a previous low level of 17 about 11 months ago.  However, patient's calcium level on 05/01/2014 was mildly elevated at 10.7.  I called patient's nephrologist Dr. 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  He advised continuing the vitamin D at this dose, and monitoring her calcium level to make sure that is not getting too high.  According to Dr. Posey Pronto, patient is scheduled to see him week after next.  I called patient to discuss this with her.  She has not yet had the vitamin D3 prescription filled or started taking it.  Given this, I advised her to hold off on starting it until she sees Dr. Posey Pronto in his office.  I will also request a follow-up appointment in our clinic in 6 weeks.

## 2014-05-09 NOTE — Progress Notes (Signed)
Quick Note:  I started vitamin D3 replacement on 05/01/14 given a low vitamin D level of 26 in November 2015, and a previous low level of 17 about 11 months ago. However, patient's calcium level on 05/01/2014 was mildly elevated at 10.7. I called patient's nephrologist Dr. 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 He advised continuing the vitamin D at this dose, and monitoring her calcium level to make sure that is not getting too high. According to Dr. Posey Pronto, patient is scheduled to see him week after next.  I called patient to discuss this with her. She has not yet had the vitamin D3 prescription filled or started taking it. Given this, I advised her to hold off on starting it until she sees Dr. Posey Pronto in his office. I will also request a follow-up appointment in our clinic in 6 weeks. ______

## 2014-05-18 ENCOUNTER — Encounter (HOSPITAL_COMMUNITY)
Admission: RE | Admit: 2014-05-18 | Discharge: 2014-05-18 | Disposition: A | Discharge status: Home / Self Care | Payer: Medicare Other | Source: Ambulatory Visit | Attending: Nephrology | Admitting: Nephrology

## 2014-05-18 DIAGNOSIS — N184 Chronic kidney disease, stage 4 (severe): Secondary | ICD-10-CM | POA: Diagnosis not present

## 2014-05-18 DIAGNOSIS — D631 Anemia in chronic kidney disease: Secondary | ICD-10-CM | POA: Insufficient documentation

## 2014-05-18 LAB — POCT HEMOGLOBIN-HEMACUE: Hemoglobin: 8.5 g/dL — ABNORMAL LOW (ref 12.0–15.0)

## 2014-05-18 MED ORDER — EPOETIN ALFA 40000 UNIT/ML IJ SOLN
INTRAMUSCULAR | Status: DC
Start: 2014-05-18 — End: 2014-05-19
  Filled 2014-05-18: qty 1

## 2014-05-18 MED ORDER — EPOETIN ALFA 40000 UNIT/ML IJ SOLN
40000.0000 [IU] | INTRAMUSCULAR | Status: DC
  Administered 2014-05-18: 40000 [IU] via SUBCUTANEOUS

## 2014-05-18 MED ORDER — CLONIDINE HCL 0.1 MG PO TABS: 0.1000 mg | ORAL_TABLET | ORAL | Status: DC | PRN

## 2014-05-24 ENCOUNTER — Telehealth: Payer: Self-pay | Admitting: *Deleted

## 2014-05-24 DIAGNOSIS — J449 Chronic obstructive pulmonary disease, unspecified: Secondary | ICD-10-CM

## 2014-05-24 DIAGNOSIS — G4730 Sleep apnea, unspecified: Secondary | ICD-10-CM

## 2014-05-24 NOTE — Telephone Encounter (Signed)
Pt called and states she has an Oxygen tank at her home and she has not used in over a year.  She is having to pay for this service.   She would like an order for  [facility] to go to her home and pick up Oxygen tank.    Will this be okay with you?

## 2014-05-25 NOTE — Telephone Encounter (Signed)
We can do this, please enter a DME order for overnight oximetry and send to [place].  She will set it up. I have informed the patient.

## 2014-05-25 NOTE — Telephone Encounter (Signed)
I would like to have oximetry done including overnight oximetry to make sure she does not need the oxygen.  Can this be arranged?

## 2014-05-25 NOTE — Telephone Encounter (Signed)
The order is already in the chart - I placed it on 05/01/2014.

## 2014-05-28 ENCOUNTER — Other Ambulatory Visit: Payer: Self-pay | Admitting: Internal Medicine

## 2014-05-28 DIAGNOSIS — H269 Unspecified cataract: Secondary | ICD-10-CM

## 2014-05-29 ENCOUNTER — Other Ambulatory Visit: Payer: Self-pay | Admitting: Internal Medicine

## 2014-06-01 ENCOUNTER — Encounter (HOSPITAL_COMMUNITY)
Admission: RE | Admit: 2014-06-01 | Discharge: 2014-06-01 | Disposition: A | Discharge status: Home / Self Care | Payer: Medicare Other | Source: Ambulatory Visit | Attending: Nephrology | Admitting: Nephrology

## 2014-06-01 DIAGNOSIS — N184 Chronic kidney disease, stage 4 (severe): Secondary | ICD-10-CM | POA: Diagnosis not present

## 2014-06-01 DIAGNOSIS — D631 Anemia in chronic kidney disease: Secondary | ICD-10-CM | POA: Diagnosis not present

## 2014-06-01 LAB — FERRITIN: Ferritin: 809 ng/mL — ABNORMAL HIGH (ref 10–291)

## 2014-06-01 LAB — IRON AND TIBC
Iron: 47 ug/dL (ref 42–145)
Saturation Ratios: 23 % (ref 20–55)
TIBC: 208 ug/dL — ABNORMAL LOW (ref 250–470)
UIBC: 161 ug/dL (ref 125–400)

## 2014-06-01 LAB — POCT HEMOGLOBIN-HEMACUE: Hemoglobin: 8.9 g/dL — ABNORMAL LOW (ref 12.0–15.0)

## 2014-06-01 MED ORDER — EPOETIN ALFA 40000 UNIT/ML IJ SOLN
40000.0000 [IU] | INTRAMUSCULAR | Status: DC
  Administered 2014-06-01: 40000 [IU] via SUBCUTANEOUS

## 2014-06-01 MED ORDER — EPOETIN ALFA 40000 UNIT/ML IJ SOLN
INTRAMUSCULAR | Status: AC
  Administered 2014-06-01: 40000 [IU] via SUBCUTANEOUS
  Filled 2014-06-01: qty 1

## 2014-06-01 MED ORDER — CLONIDINE HCL 0.1 MG PO TABS: 0.1000 mg | ORAL_TABLET | ORAL | Status: DC | PRN

## 2014-06-03 DIAGNOSIS — G4733 Obstructive sleep apnea (adult) (pediatric): Secondary | ICD-10-CM | POA: Diagnosis not present

## 2014-06-05 ENCOUNTER — Other Ambulatory Visit: Payer: Self-pay | Admitting: Internal Medicine

## 2014-06-05 ENCOUNTER — Encounter: Payer: Self-pay | Admitting: *Deleted

## 2014-06-14 ENCOUNTER — Encounter (HOSPITAL_COMMUNITY)
Admission: RE | Admit: 2014-06-14 | Discharge: 2014-06-14 | Disposition: A | Discharge status: Home / Self Care | Payer: Medicare Other | Source: Ambulatory Visit | Attending: Nephrology | Admitting: Nephrology

## 2014-06-14 DIAGNOSIS — N184 Chronic kidney disease, stage 4 (severe): Secondary | ICD-10-CM | POA: Insufficient documentation

## 2014-06-14 DIAGNOSIS — D631 Anemia in chronic kidney disease: Secondary | ICD-10-CM | POA: Diagnosis present

## 2014-06-14 LAB — POCT HEMOGLOBIN-HEMACUE: Hemoglobin: 8.8 g/dL — ABNORMAL LOW (ref 12.0–15.0)

## 2014-06-14 MED ORDER — EPOETIN ALFA 40000 UNIT/ML IJ SOLN
40000.0000 [IU] | INTRAMUSCULAR | Status: DC
  Administered 2014-06-14: 40000 [IU] via SUBCUTANEOUS

## 2014-06-14 MED ORDER — CLONIDINE HCL 0.1 MG PO TABS: 0.1000 mg | ORAL_TABLET | ORAL | Status: DC | PRN

## 2014-06-14 MED ORDER — EPOETIN ALFA 40000 UNIT/ML IJ SOLN
INTRAMUSCULAR | Status: AC
  Administered 2014-06-14: 40000 [IU] via SUBCUTANEOUS
  Filled 2014-06-14: qty 1

## 2014-06-20 ENCOUNTER — Ambulatory Visit: Payer: Medicare Other | Admitting: Internal Medicine

## 2014-06-29 ENCOUNTER — Encounter (HOSPITAL_COMMUNITY): Payer: Medicare Other

## 2014-06-30 ENCOUNTER — Other Ambulatory Visit: Payer: Self-pay | Admitting: Internal Medicine

## 2014-07-03 ENCOUNTER — Other Ambulatory Visit: Payer: Self-pay | Admitting: Internal Medicine

## 2014-07-05 ENCOUNTER — Encounter (HOSPITAL_COMMUNITY)
Admission: RE | Admit: 2014-07-05 | Discharge: 2014-07-05 | Disposition: A | Discharge status: Home / Self Care | Payer: Medicare Other | Source: Ambulatory Visit | Attending: Nephrology | Admitting: Nephrology

## 2014-07-05 DIAGNOSIS — N184 Chronic kidney disease, stage 4 (severe): Secondary | ICD-10-CM | POA: Diagnosis not present

## 2014-07-05 DIAGNOSIS — D631 Anemia in chronic kidney disease: Secondary | ICD-10-CM | POA: Diagnosis not present

## 2014-07-05 LAB — POCT HEMOGLOBIN-HEMACUE: Hemoglobin: 7.7 g/dL — ABNORMAL LOW (ref 12.0–15.0)

## 2014-07-05 LAB — IRON AND TIBC
Iron: 37 ug/dL (ref 28–170)
Saturation Ratios: 17 % (ref 10.4–31.8)
TIBC: 221 ug/dL — ABNORMAL LOW (ref 250–450)
UIBC: 184 ug/dL

## 2014-07-05 LAB — FERRITIN: Ferritin: 515 ng/mL — ABNORMAL HIGH (ref 11–307)

## 2014-07-05 MED ORDER — EPOETIN ALFA 40000 UNIT/ML IJ SOLN
INTRAMUSCULAR | Status: AC
  Filled 2014-07-05: qty 1

## 2014-07-05 MED ORDER — EPOETIN ALFA 10000 UNIT/ML IJ SOLN
INTRAMUSCULAR | Status: AC
  Filled 2014-07-05: qty 1

## 2014-07-05 MED ORDER — EPOETIN ALFA 40000 UNIT/ML IJ SOLN
40000.0000 [IU] | INTRAMUSCULAR | Status: DC
  Administered 2014-07-05: 40000 [IU] via SUBCUTANEOUS

## 2014-07-05 MED ORDER — EPOETIN ALFA 10000 UNIT/ML IJ SOLN
50000.0000 [IU] | Freq: Once | INTRAMUSCULAR | Status: AC
  Administered 2014-07-05: 10000 [IU] via SUBCUTANEOUS
  Filled 2014-07-05: qty 5

## 2014-07-05 MED ORDER — CLONIDINE HCL 0.1 MG PO TABS: 0.1000 mg | ORAL_TABLET | ORAL | Status: DC | PRN

## 2014-07-05 NOTE — Progress Notes (Signed)
Called Crystal at [facility] reported hemoglobin of 7.7 today and that patient states she has not seen any bleeding, has not had any chest pain or shortness of breath, but that she has had a bad cold the past week so she had not been eating but feels better today then she has in the last few days.  Orders received to increase patients procrit dosage.

## 2014-07-16 ENCOUNTER — Other Ambulatory Visit (HOSPITAL_COMMUNITY): Payer: Self-pay | Admitting: *Deleted

## 2014-07-19 ENCOUNTER — Encounter (HOSPITAL_COMMUNITY)
Admission: RE | Admit: 2014-07-19 | Discharge: 2014-07-19 | Disposition: A | Discharge status: Home / Self Care | Payer: Medicare Other | Source: Ambulatory Visit | Attending: Nephrology | Admitting: Nephrology

## 2014-07-19 DIAGNOSIS — D631 Anemia in chronic kidney disease: Secondary | ICD-10-CM | POA: Diagnosis not present

## 2014-07-19 LAB — POCT HEMOGLOBIN-HEMACUE: Hemoglobin: 8.7 g/dL — ABNORMAL LOW (ref 12.0–15.0)

## 2014-07-19 MED ORDER — EPOETIN ALFA 40000 UNIT/ML IJ SOLN: 50000.0000 [IU] | INTRAMUSCULAR | Status: DC

## 2014-07-19 MED ORDER — EPOETIN ALFA 40000 UNIT/ML IJ SOLN
INTRAMUSCULAR | Status: AC
  Administered 2014-07-19: 40000 [IU] via SUBCUTANEOUS
  Filled 2014-07-19: qty 1

## 2014-07-19 MED ORDER — EPOETIN ALFA 10000 UNIT/ML IJ SOLN
INTRAMUSCULAR | Status: AC
  Administered 2014-07-19: 10000 [IU] via SUBCUTANEOUS
  Filled 2014-07-19: qty 1

## 2014-07-21 ENCOUNTER — Other Ambulatory Visit: Payer: Self-pay | Admitting: Internal Medicine

## 2014-07-31 ENCOUNTER — Other Ambulatory Visit: Payer: Self-pay | Admitting: Internal Medicine

## 2014-08-02 ENCOUNTER — Other Ambulatory Visit: Payer: Self-pay | Admitting: Internal Medicine

## 2014-08-02 ENCOUNTER — Encounter (HOSPITAL_COMMUNITY)
Admission: RE | Admit: 2014-08-02 | Discharge: 2014-08-02 | Disposition: A | Discharge status: Home / Self Care | Payer: Medicare Other | Source: Ambulatory Visit | Attending: Nephrology | Admitting: Nephrology

## 2014-08-02 DIAGNOSIS — D631 Anemia in chronic kidney disease: Secondary | ICD-10-CM | POA: Diagnosis not present

## 2014-08-02 LAB — IRON AND TIBC
Iron: 51 ug/dL (ref 28–170)
SATURATION RATIOS: 21 % (ref 10.4–31.8)
TIBC: 242 ug/dL — ABNORMAL LOW (ref 250–450)
UIBC: 191 ug/dL

## 2014-08-02 LAB — POCT HEMOGLOBIN-HEMACUE: Hemoglobin: 9.4 g/dL — ABNORMAL LOW (ref 12.0–15.0)

## 2014-08-02 LAB — FERRITIN: Ferritin: 559 ng/mL — ABNORMAL HIGH (ref 11–307)

## 2014-08-02 MED ORDER — EPOETIN ALFA 40000 UNIT/ML IJ SOLN
INTRAMUSCULAR | Status: AC
Start: 2014-08-02 — End: 2014-08-02
  Administered 2014-08-02: 40000 [IU]
  Filled 2014-08-02: qty 1

## 2014-08-02 MED ORDER — EPOETIN ALFA 40000 UNIT/ML IJ SOLN: 50000.0000 [IU] | INTRAMUSCULAR | Status: DC

## 2014-08-02 MED ORDER — EPOETIN ALFA 10000 UNIT/ML IJ SOLN
INTRAMUSCULAR | Status: AC
Start: 2014-08-02 — End: 2014-08-02
  Administered 2014-08-02: 10000 [IU]
  Filled 2014-08-02: qty 1

## 2014-08-03 ENCOUNTER — Other Ambulatory Visit: Payer: Self-pay | Admitting: Internal Medicine

## 2014-08-07 ENCOUNTER — Other Ambulatory Visit: Payer: Self-pay | Admitting: Internal Medicine

## 2014-08-07 DIAGNOSIS — M159 Polyosteoarthritis, unspecified: Secondary | ICD-10-CM

## 2014-08-07 MED ORDER — ACETAMINOPHEN-CODEINE #3 300-30 MG PO TABS: 1.0000 | ORAL_TABLET | Freq: Two times a day (BID) | ORAL | Status: DC

## 2014-08-07 NOTE — Telephone Encounter (Signed)
Refill called in. 

## 2014-08-09 ENCOUNTER — Other Ambulatory Visit: Payer: Self-pay | Admitting: Internal Medicine

## 2014-08-09 NOTE — Telephone Encounter (Signed)
Pain meds have been called in

## 2014-08-09 NOTE — Telephone Encounter (Signed)
Pharmacy [facility] [address]

## 2014-08-10 MED ORDER — INSULIN PEN NEEDLE 32G X 6 MM MISC: Status: DC

## 2014-08-10 MED ORDER — GLUCOSE BLOOD VI STRP: ORAL_STRIP | Status: DC

## 2014-08-21 ENCOUNTER — Encounter: Payer: Medicare Other | Admitting: Internal Medicine

## 2014-08-24 ENCOUNTER — Encounter (HOSPITAL_COMMUNITY)
Admission: RE | Admit: 2014-08-24 | Discharge: 2014-08-24 | Disposition: A | Discharge status: Home / Self Care | Payer: Medicare Other | Source: Ambulatory Visit | Attending: Nephrology | Admitting: Nephrology

## 2014-08-24 DIAGNOSIS — D631 Anemia in chronic kidney disease: Secondary | ICD-10-CM | POA: Diagnosis not present

## 2014-08-24 DIAGNOSIS — N184 Chronic kidney disease, stage 4 (severe): Secondary | ICD-10-CM | POA: Diagnosis not present

## 2014-08-24 LAB — POCT HEMOGLOBIN-HEMACUE: Hemoglobin: 9 g/dL — ABNORMAL LOW (ref 12.0–15.0)

## 2014-08-24 MED ORDER — CLONIDINE HCL 0.1 MG PO TABS: 0.1000 mg | ORAL_TABLET | ORAL | Status: DC | PRN

## 2014-08-24 MED ORDER — EPOETIN ALFA 40000 UNIT/ML IJ SOLN
INTRAMUSCULAR | Status: AC
  Filled 2014-08-24: qty 1

## 2014-08-24 MED ORDER — EPOETIN ALFA 40000 UNIT/ML IJ SOLN
50000.0000 [IU] | INTRAMUSCULAR | Status: DC
  Administered 2014-08-24: 40000 [IU] via SUBCUTANEOUS

## 2014-08-24 MED ORDER — EPOETIN ALFA 10000 UNIT/ML IJ SOLN
INTRAMUSCULAR | Status: AC
  Administered 2014-08-24: 10000 [IU] via SUBCUTANEOUS
  Filled 2014-08-24: qty 1

## 2014-08-27 IMAGING — CR DG CHEST 1V
1 series · 1 of 1 positions shown · non-contrast
Comparison: 06/23/2012

CLINICAL DATA: Prior abnormal chest x-ray question nodule versus
nipple shadow at left lung base

CHEST - 1 VIEW

[w chest pa]
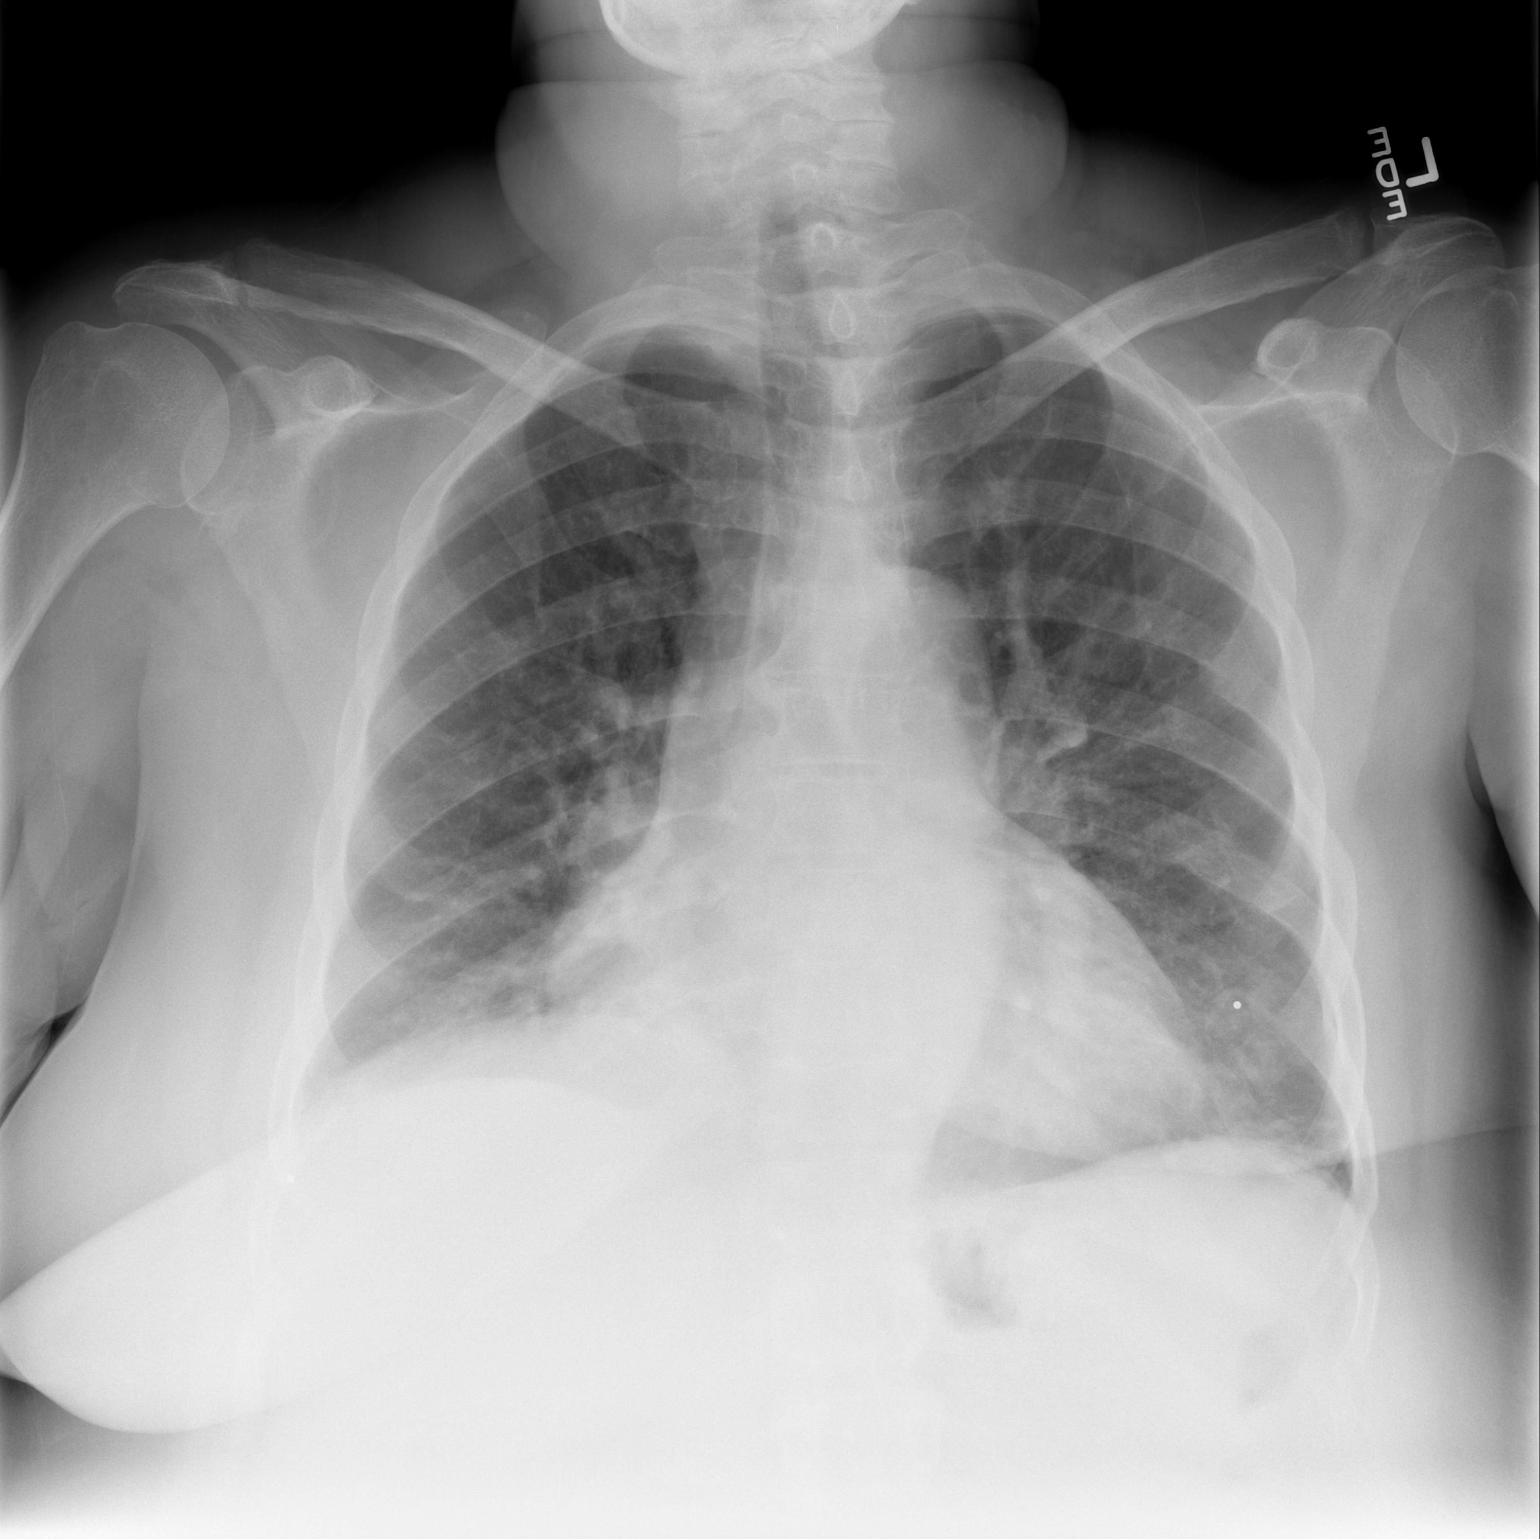

[1 of 1 positions shown; findings below may reference images not displayed]

FINDINGS: Left nipple marker corresponds with the nodular density as seen on
prior chest radiograph when accounting for differences in degree of
inflation.
Enlargement of cardiac silhouette with pulmonary vascular
congestion.
Tortuous aorta.
Bibasilar atelectasis.
Upper lungs clear.
No gross pleural effusion or pneumothorax.
IMPRESSION: Enlargement of cardiac silhouette with pulmonary vascular
congestion.
Bibasilar atelectasis.
Nipple shadow on previous exam with no evidence of pulmonary
nodule.

## 2014-08-28 ENCOUNTER — Other Ambulatory Visit: Payer: Self-pay | Admitting: Internal Medicine

## 2014-08-28 ENCOUNTER — Encounter: Payer: Self-pay | Admitting: Internal Medicine

## 2014-08-28 ENCOUNTER — Ambulatory Visit (INDEPENDENT_AMBULATORY_CARE_PROVIDER_SITE_OTHER): Payer: Medicare Other | Admitting: Internal Medicine

## 2014-08-28 VITALS — BP 132/59 | HR 72 | Temp 98.1°F | Ht 64.0 in | Wt 250.4 lb

## 2014-08-28 DIAGNOSIS — I10 Essential (primary) hypertension: Secondary | ICD-10-CM

## 2014-08-28 DIAGNOSIS — G4733 Obstructive sleep apnea (adult) (pediatric): Secondary | ICD-10-CM

## 2014-08-28 DIAGNOSIS — N183 Chronic kidney disease, stage 3 unspecified: Secondary | ICD-10-CM

## 2014-08-28 DIAGNOSIS — G4730 Sleep apnea, unspecified: Secondary | ICD-10-CM

## 2014-08-28 DIAGNOSIS — Z0000 Encounter for general adult medical examination without abnormal findings: Secondary | ICD-10-CM

## 2014-08-28 DIAGNOSIS — E1140 Type 2 diabetes mellitus with diabetic neuropathy, unspecified: Secondary | ICD-10-CM | POA: Diagnosis not present

## 2014-08-28 DIAGNOSIS — E559 Vitamin D deficiency, unspecified: Secondary | ICD-10-CM

## 2014-08-28 DIAGNOSIS — Z7951 Long term (current) use of inhaled steroids: Secondary | ICD-10-CM | POA: Diagnosis not present

## 2014-08-28 DIAGNOSIS — E1122 Type 2 diabetes mellitus with diabetic chronic kidney disease: Secondary | ICD-10-CM | POA: Diagnosis not present

## 2014-08-28 DIAGNOSIS — E1142 Type 2 diabetes mellitus with diabetic polyneuropathy: Secondary | ICD-10-CM

## 2014-08-28 DIAGNOSIS — Z794 Long term (current) use of insulin: Secondary | ICD-10-CM | POA: Diagnosis not present

## 2014-08-28 DIAGNOSIS — Z79899 Other long term (current) drug therapy: Secondary | ICD-10-CM

## 2014-08-28 DIAGNOSIS — Z9989 Dependence on other enabling machines and devices: Secondary | ICD-10-CM

## 2014-08-28 DIAGNOSIS — N189 Chronic kidney disease, unspecified: Secondary | ICD-10-CM

## 2014-08-28 DIAGNOSIS — J449 Chronic obstructive pulmonary disease, unspecified: Secondary | ICD-10-CM | POA: Diagnosis not present

## 2014-08-28 DIAGNOSIS — E785 Hyperlipidemia, unspecified: Secondary | ICD-10-CM

## 2014-08-28 DIAGNOSIS — D631 Anemia in chronic kidney disease: Secondary | ICD-10-CM

## 2014-08-28 DIAGNOSIS — E118 Type 2 diabetes mellitus with unspecified complications: Secondary | ICD-10-CM

## 2014-08-28 DIAGNOSIS — I129 Hypertensive chronic kidney disease with stage 1 through stage 4 chronic kidney disease, or unspecified chronic kidney disease: Secondary | ICD-10-CM

## 2014-08-28 LAB — GLUCOSE, CAPILLARY: GLUCOSE-CAPILLARY: 113 mg/dL — AB (ref 65–99)

## 2014-08-28 LAB — POCT GLYCOSYLATED HEMOGLOBIN (HGB A1C): Hemoglobin A1C: 6.2

## 2014-08-28 MED ORDER — VITAMIN D3 25 MCG (1000 UT) PO CAPS: 1.0000 | ORAL_CAPSULE | Freq: Every day | ORAL | Status: DC

## 2014-08-28 MED ORDER — INSULIN GLARGINE 100 UNIT/ML SOLOSTAR PEN: PEN_INJECTOR | SUBCUTANEOUS | Status: DC

## 2014-08-28 NOTE — Assessment & Plan Note (Signed)
BP Readings from Last 3 Encounters:  08/28/14 132/59  08/24/14 138/63  08/02/14 149/61    Lab Results  Component Value Date   NA 143 05/01/2014   K 4.2 05/01/2014   CREATININE 1.68* 05/01/2014    Assessment: Blood pressure control: controlled Progress toward BP goal:  at goal Comments: BP well controlled.   Plan: Medications:  Continue Norvasc 10 mg daily, Atenolol 50 mg BID, Clonidine 0.3 mg BID, Enalapril 20 mg BID, and Lasix 40 mg daily.  Educational resources provided: brochure, handout, video

## 2014-08-28 NOTE — Assessment & Plan Note (Addendum)
Stable. Patient denies any SOB, cough, wheezing. She uses Duonebs and Combivent at home. Patient does not use oxygen at home.  - Continue current regimen

## 2014-08-28 NOTE — Assessment & Plan Note (Signed)
Lab Results  Component Value Date   HGBA1C 6.2 08/28/2014   HGBA1C 7.5 05/01/2014   HGBA1C 7.4 12/12/2013     Assessment: Diabetes control: good control (HgbA1C at goal) Progress toward A1C goal:  at goal Comments: HbA1c has improved to 6.2 from 7.5.   Plan: Medications:  Continue Glipizide 20 mg daily, Januvia 50 mg daily, and Lantus 45 units QHS Home glucose monitoring: Frequency: 2 times a day Timing: before meals Instruction/counseling given: reminded to get eye exam, reminded to bring blood glucose meter & log to each visit and reminded to bring medications to each visit Educational resources provided: brochure, handout Self management tools provided: copy of home glucose meter download Other plans:  - Urine microalbumin today - Discussed getting eye exam- patient to call ophtho this week to schedule appt - Repeat HbA1c in 3 months

## 2014-08-28 NOTE — Assessment & Plan Note (Signed)
Patient reports she is still not using desipramine. We discussed the use of this medication and that it could help her peripheral neuropathy pain.  - Start Desipramine

## 2014-08-28 NOTE — Assessment & Plan Note (Signed)
Patient reports compliance with her CPAP. She denies daytime sleepiness, SOB, fatigue.  - Continue CPAP

## 2014-08-28 NOTE — Assessment & Plan Note (Signed)
Hgb stable at 9.0 on 08/24/14. Patient takes ferrous sulfate 325 mg BID.  - Continue iron supplementation

## 2014-08-28 NOTE — Assessment & Plan Note (Signed)
Last lipid panel in Nov 2015 with LDL 31. This is at goal of < 70 given her DM.  - Continue simvastatin 20 mg daily

## 2014-08-28 NOTE — Patient Instructions (Addendum)
Please make an appointment for an eye exam at [facility] [phone].   - Start taking Vitamin D 1000 mg daily - You will be called by the Gastroenterology office to schedule a colonoscopy   General Instructions:   Thank you for bringing your medicines today. This helps [place] keep you safe from mistakes.   Progress Toward Treatment Goals:  Treatment Goal 05/01/2014  Hemoglobin A1C unchanged  Blood pressure improved    Self Care Goals & Plans:  Self Care Goal 08/28/2014  Manage my medications take my medicines as prescribed; bring my medications to every visit; refill my medications on time; follow the sick day instructions if I am sick  Monitor my health keep track of my blood glucose; bring my glucose meter and log to each visit; check my feet daily  Eat healthy foods eat more vegetables; eat fruit for snacks and desserts; eat baked foods instead of fried foods; eat smaller portions; drink diet soda or water instead of juice or soda  Be physically active find an activity I enjoy  Meeting treatment goals -    Home Blood Glucose Monitoring 05/01/2014  Check my blood sugar 2 times a day  When to check my blood sugar before breakfast; before dinner     Care Management & Community Referrals:  Referral 05/01/2014  Referrals made for care management support -  Referrals made to community resources none

## 2014-08-28 NOTE — Assessment & Plan Note (Signed)
Referral to GI for colonoscopy placed

## 2014-08-28 NOTE — Assessment & Plan Note (Signed)
Stable. Last Cr 1.68 on 05/01/14.  - Monitor periodically

## 2014-08-28 NOTE — Progress Notes (Signed)
   Subjective:    Patient ID: Joan Banks, female    DOB: Feb 11, 1945, 69 y.o.   MRN: [id number]  HPI Joan Banks is a 69yo woman with PMHx of HTN, Type 2 DM, COPD, CKD Stage 3, and hyperlipidemia who presents today for a routine visit.  Please refer to A&P documentation.    Review of Systems General: Denies fever, chills, night sweats, changes in weight, changes in appetite HEENT: Denies headaches, ear pain, changes in vision, rhinorrhea, sore throat CV: Denies CP, palpitations, SOB, orthopnea Pulm: Denies SOB, cough, wheezing GI: Denies abdominal pain, nausea, vomiting, diarrhea, constipation, melena, hematochezia GU: Denies dysuria, hematuria, frequency Msk: Denies muscle cramps, joint pains Neuro: Denies weakness, numbness, tingling Skin: Denies rashes, bruising    Objective:   Physical Exam General: sitting up in chair, NAD HEENT: [place]/AT, EOMI, sclera anicteric, mucus membranes moist CV: RRR, no m/g/r Pulm: CTA bilaterally, breaths non-labored Abd: BS+, soft, obese, non-tender Ext: warm, no edema, moves all Neuro: alert and oriented x 3    Assessment & Plan:  Refer to A&P documentation.

## 2014-08-28 NOTE — Assessment & Plan Note (Signed)
Last Vitamin D level 26 on 12/21/13. Patient was recommended to start vitamin D supplementation but then this was held due to mild hypercalcemia of 10.7 on 05/01/14. Patient reports she has not been taking vitamin D supplements.  - Start vitamin D 1000 mg daily - Check vitamin D level in 8 weeks

## 2014-08-29 LAB — MICROALBUMIN / CREATININE URINE RATIO
Creatinine, Urine: 101.9 mg/dL
Microalb Creat Ratio: 322.9 mg/g — ABNORMAL HIGH (ref 0.0–30.0)
Microalb, Ur: 32.9 mg/dL — ABNORMAL HIGH (ref <2.0)

## 2014-08-31 NOTE — Progress Notes (Signed)
Internal Medicine Clinic Attending  Case discussed with Dr. Rivet soon after the resident saw the patient.  We reviewed the resident's history and exam and pertinent patient test results.  I agree with the assessment, diagnosis, and plan of care documented in the resident's note.  

## 2014-09-07 ENCOUNTER — Encounter (HOSPITAL_COMMUNITY)
Admission: RE | Admit: 2014-09-07 | Discharge: 2014-09-07 | Disposition: A | Discharge status: Home / Self Care | Payer: Medicare Other | Source: Ambulatory Visit | Attending: Nephrology | Admitting: Nephrology

## 2014-09-07 DIAGNOSIS — N184 Chronic kidney disease, stage 4 (severe): Secondary | ICD-10-CM | POA: Insufficient documentation

## 2014-09-07 DIAGNOSIS — D631 Anemia in chronic kidney disease: Secondary | ICD-10-CM | POA: Diagnosis not present

## 2014-09-07 LAB — IRON AND TIBC
IRON: 37 ug/dL (ref 28–170)
SATURATION RATIOS: 18 % (ref 10.4–31.8)
TIBC: 203 ug/dL — AB (ref 250–450)
UIBC: 166 ug/dL

## 2014-09-07 LAB — POCT HEMOGLOBIN-HEMACUE: Hemoglobin: 8.4 g/dL — ABNORMAL LOW (ref 12.0–15.0)

## 2014-09-07 LAB — FERRITIN: Ferritin: 561 ng/mL — ABNORMAL HIGH (ref 11–307)

## 2014-09-07 MED ORDER — EPOETIN ALFA 10000 UNIT/ML IJ SOLN
INTRAMUSCULAR | Status: AC
  Administered 2014-09-07: 10000 [IU]
  Filled 2014-09-07: qty 1

## 2014-09-07 MED ORDER — EPOETIN ALFA 40000 UNIT/ML IJ SOLN
50000.0000 [IU] | INTRAMUSCULAR | Status: DC
  Administered 2014-09-07: 40000 [IU] via SUBCUTANEOUS

## 2014-09-07 MED ORDER — CLONIDINE HCL 0.1 MG PO TABS: 0.1000 mg | ORAL_TABLET | ORAL | Status: DC | PRN

## 2014-09-07 MED ORDER — EPOETIN ALFA 40000 UNIT/ML IJ SOLN
INTRAMUSCULAR | Status: AC
  Administered 2014-09-07: 40000 [IU] via SUBCUTANEOUS
  Filled 2014-09-07: qty 1

## 2014-09-21 ENCOUNTER — Telehealth: Payer: Self-pay | Admitting: *Deleted

## 2014-09-21 ENCOUNTER — Encounter (HOSPITAL_COMMUNITY)
Admission: RE | Admit: 2014-09-21 | Discharge: 2014-09-21 | Disposition: A | Discharge status: Home / Self Care | Payer: Medicare Other | Source: Ambulatory Visit | Attending: Nephrology | Admitting: Nephrology

## 2014-09-21 DIAGNOSIS — D631 Anemia in chronic kidney disease: Secondary | ICD-10-CM | POA: Diagnosis not present

## 2014-09-21 LAB — POCT HEMOGLOBIN-HEMACUE: HEMOGLOBIN: 9.5 g/dL — AB (ref 12.0–15.0)

## 2014-09-21 MED ORDER — EPOETIN ALFA 40000 UNIT/ML IJ SOLN
INTRAMUSCULAR | Status: AC
  Administered 2014-09-21: 40000 [IU] via SUBCUTANEOUS
  Filled 2014-09-21: qty 1

## 2014-09-21 MED ORDER — EPOETIN ALFA 10000 UNIT/ML IJ SOLN
INTRAMUSCULAR | Status: AC
  Administered 2014-09-21: 10000 [IU] via SUBCUTANEOUS
  Filled 2014-09-21: qty 1

## 2014-09-21 MED ORDER — CLONIDINE HCL 0.1 MG PO TABS: 0.1000 mg | ORAL_TABLET | ORAL | Status: DC | PRN

## 2014-09-21 MED ORDER — EPOETIN ALFA 40000 UNIT/ML IJ SOLN
50000.0000 [IU] | INTRAMUSCULAR | Status: DC
  Administered 2014-09-21: 40000 [IU] via SUBCUTANEOUS

## 2014-09-21 NOTE — Telephone Encounter (Signed)
CALLED PATIENT, NO ANSWER. CALL IS IN REGARDS TO HER GI APPOINTMENT WITH DR HUNG.  / 10-10-016  @ 10:00AM  / LOCATED AT [address] /  [phone].  PATIENT CAN CALL OFFICE IF THIS IS NOT A GOOD APPOINTMENT FOR HER.

## 2014-09-30 ENCOUNTER — Other Ambulatory Visit: Payer: Self-pay | Admitting: Internal Medicine

## 2014-10-04 ENCOUNTER — Encounter (HOSPITAL_COMMUNITY)
Admission: RE | Admit: 2014-10-04 | Discharge: 2014-10-04 | Disposition: A | Discharge status: Home / Self Care | Payer: Medicare Other | Source: Ambulatory Visit | Attending: Nephrology | Admitting: Nephrology

## 2014-10-04 DIAGNOSIS — D631 Anemia in chronic kidney disease: Secondary | ICD-10-CM | POA: Insufficient documentation

## 2014-10-04 DIAGNOSIS — N184 Chronic kidney disease, stage 4 (severe): Secondary | ICD-10-CM | POA: Insufficient documentation

## 2014-10-04 LAB — IRON AND TIBC
IRON: 31 ug/dL (ref 28–170)
SATURATION RATIOS: 13 % (ref 10.4–31.8)
TIBC: 231 ug/dL — AB (ref 250–450)
UIBC: 200 ug/dL

## 2014-10-04 LAB — FERRITIN: FERRITIN: 502 ng/mL — AB (ref 11–307)

## 2014-10-04 MED ORDER — CLONIDINE HCL 0.1 MG PO TABS: 0.1000 mg | ORAL_TABLET | ORAL | Status: DC | PRN

## 2014-10-04 MED ORDER — EPOETIN ALFA 10000 UNIT/ML IJ SOLN
INTRAMUSCULAR | Status: AC
  Administered 2014-10-04: 10000 [IU]
  Filled 2014-10-04: qty 1

## 2014-10-04 MED ORDER — EPOETIN ALFA 40000 UNIT/ML IJ SOLN
INTRAMUSCULAR | Status: AC
  Administered 2014-10-04: 40000 [IU] via SUBCUTANEOUS
  Filled 2014-10-04: qty 1

## 2014-10-04 MED ORDER — EPOETIN ALFA 40000 UNIT/ML IJ SOLN
50000.0000 [IU] | INTRAMUSCULAR | Status: DC
  Administered 2014-10-04: 40000 [IU] via SUBCUTANEOUS

## 2014-10-05 LAB — POCT HEMOGLOBIN-HEMACUE: Hemoglobin: 9.1 g/dL — ABNORMAL LOW (ref 12.0–15.0)

## 2014-10-17 ENCOUNTER — Other Ambulatory Visit: Payer: Self-pay | Admitting: Internal Medicine

## 2014-10-18 ENCOUNTER — Other Ambulatory Visit (HOSPITAL_COMMUNITY): Payer: Self-pay

## 2014-10-19 ENCOUNTER — Inpatient Hospital Stay (HOSPITAL_COMMUNITY): Admission: RE | Admit: 2014-10-19 | Payer: Medicare Other | Source: Ambulatory Visit

## 2014-10-19 NOTE — Telephone Encounter (Signed)
Rx called in to pharmacy. Hilda Blades Jude Naclerio RN 10/19/14 3PM

## 2014-10-25 ENCOUNTER — Other Ambulatory Visit (HOSPITAL_COMMUNITY): Payer: Self-pay | Admitting: *Deleted

## 2014-10-26 ENCOUNTER — Encounter (HOSPITAL_COMMUNITY)
Admission: RE | Admit: 2014-10-26 | Discharge: 2014-10-26 | Disposition: A | Discharge status: Home / Self Care | Payer: Medicare Other | Source: Ambulatory Visit | Attending: Nephrology | Admitting: Nephrology

## 2014-10-26 DIAGNOSIS — D631 Anemia in chronic kidney disease: Secondary | ICD-10-CM | POA: Diagnosis not present

## 2014-10-26 LAB — POCT HEMOGLOBIN-HEMACUE: Hemoglobin: 9.4 g/dL — ABNORMAL LOW (ref 12.0–15.0)

## 2014-10-26 MED ORDER — EPOETIN ALFA 10000 UNIT/ML IJ SOLN
INTRAMUSCULAR | Status: AC
  Administered 2014-10-26: 10000 [IU] via SUBCUTANEOUS
  Filled 2014-10-26: qty 1

## 2014-10-26 MED ORDER — CLONIDINE HCL 0.1 MG PO TABS: 0.1000 mg | ORAL_TABLET | ORAL | Status: DC | PRN

## 2014-10-26 MED ORDER — SODIUM CHLORIDE 0.9 % IV SOLN
510.0000 mg | Freq: Once | INTRAVENOUS | Status: AC
  Administered 2014-10-26: 510 mg via INTRAVENOUS
  Filled 2014-10-26: qty 17

## 2014-10-26 MED ORDER — EPOETIN ALFA 40000 UNIT/ML IJ SOLN
INTRAMUSCULAR | Status: AC
  Administered 2014-10-26: 40000 [IU] via SUBCUTANEOUS
  Filled 2014-10-26: qty 1

## 2014-10-26 MED ORDER — EPOETIN ALFA 40000 UNIT/ML IJ SOLN: 50000.0000 [IU] | INTRAMUSCULAR | Status: DC

## 2014-10-27 ENCOUNTER — Other Ambulatory Visit: Payer: Self-pay | Admitting: Internal Medicine

## 2014-10-30 ENCOUNTER — Other Ambulatory Visit: Payer: Self-pay | Admitting: Internal Medicine

## 2014-11-09 ENCOUNTER — Encounter (HOSPITAL_COMMUNITY)
Admission: RE | Admit: 2014-11-09 | Discharge: 2014-11-09 | Disposition: A | Discharge status: Home / Self Care | Payer: Medicare Other | Source: Ambulatory Visit | Attending: Nephrology | Admitting: Nephrology

## 2014-11-09 DIAGNOSIS — N183 Chronic kidney disease, stage 3 (moderate): Secondary | ICD-10-CM | POA: Diagnosis present

## 2014-11-09 DIAGNOSIS — D509 Iron deficiency anemia, unspecified: Secondary | ICD-10-CM | POA: Insufficient documentation

## 2014-11-09 DIAGNOSIS — Z79899 Other long term (current) drug therapy: Secondary | ICD-10-CM | POA: Insufficient documentation

## 2014-11-09 DIAGNOSIS — Z5181 Encounter for therapeutic drug level monitoring: Secondary | ICD-10-CM | POA: Diagnosis not present

## 2014-11-09 DIAGNOSIS — D631 Anemia in chronic kidney disease: Secondary | ICD-10-CM | POA: Insufficient documentation

## 2014-11-09 LAB — POCT HEMOGLOBIN-HEMACUE: Hemoglobin: 9.9 g/dL — ABNORMAL LOW (ref 12.0–15.0)

## 2014-11-09 LAB — FERRITIN: FERRITIN: 643 ng/mL — AB (ref 11–307)

## 2014-11-09 LAB — IRON AND TIBC
IRON: 62 ug/dL (ref 28–170)
SATURATION RATIOS: 27 % (ref 10.4–31.8)
TIBC: 231 ug/dL — ABNORMAL LOW (ref 250–450)
UIBC: 169 ug/dL

## 2014-11-09 MED ORDER — EPOETIN ALFA 40000 UNIT/ML IJ SOLN
50000.0000 [IU] | INTRAMUSCULAR | Status: DC
  Administered 2014-11-09: 40000 [IU] via SUBCUTANEOUS

## 2014-11-09 MED ORDER — CLONIDINE HCL 0.1 MG PO TABS: 0.1000 mg | ORAL_TABLET | ORAL | Status: DC | PRN

## 2014-11-09 MED ORDER — SODIUM CHLORIDE 0.9 % IV SOLN
510.0000 mg | Freq: Once | INTRAVENOUS | Status: AC
  Administered 2014-11-09: 510 mg via INTRAVENOUS
  Filled 2014-11-09: qty 17

## 2014-11-09 MED ORDER — EPOETIN ALFA 40000 UNIT/ML IJ SOLN
INTRAMUSCULAR | Status: AC
  Administered 2014-11-09: 40000 [IU] via SUBCUTANEOUS
  Filled 2014-11-09: qty 1

## 2014-11-09 MED ORDER — EPOETIN ALFA 10000 UNIT/ML IJ SOLN
INTRAMUSCULAR | Status: AC
  Administered 2014-11-09: 10000 [IU] via SUBCUTANEOUS
  Filled 2014-11-09: qty 1

## 2014-11-23 ENCOUNTER — Encounter (HOSPITAL_COMMUNITY)
Admission: RE | Admit: 2014-11-23 | Discharge: 2014-11-23 | Disposition: A | Discharge status: Home / Self Care | Payer: Medicare Other | Source: Ambulatory Visit | Attending: Nephrology | Admitting: Nephrology

## 2014-11-23 DIAGNOSIS — N183 Chronic kidney disease, stage 3 (moderate): Secondary | ICD-10-CM | POA: Diagnosis not present

## 2014-11-23 MED ORDER — EPOETIN ALFA 40000 UNIT/ML IJ SOLN
50000.0000 [IU] | INTRAMUSCULAR | Status: DC
Start: 2014-11-23 — End: 2014-11-24
  Administered 2014-11-23: 40000 [IU] via SUBCUTANEOUS

## 2014-11-23 MED ORDER — CLONIDINE HCL 0.1 MG PO TABS: 0.1000 mg | ORAL_TABLET | ORAL | Status: DC | PRN

## 2014-11-23 MED ORDER — EPOETIN ALFA 10000 UNIT/ML IJ SOLN
INTRAMUSCULAR | Status: AC
  Administered 2014-11-23: 10000 [IU]
  Filled 2014-11-23: qty 1

## 2014-11-23 MED ORDER — EPOETIN ALFA 40000 UNIT/ML IJ SOLN
INTRAMUSCULAR | Status: AC
  Administered 2014-11-23: 40000 [IU] via SUBCUTANEOUS
  Filled 2014-11-23: qty 1

## 2014-11-26 LAB — POCT HEMOGLOBIN-HEMACUE: Hemoglobin: 11 g/dL — ABNORMAL LOW (ref 12.0–15.0)

## 2014-12-01 ENCOUNTER — Other Ambulatory Visit: Payer: Self-pay | Admitting: Internal Medicine

## 2014-12-07 ENCOUNTER — Encounter (HOSPITAL_COMMUNITY): Payer: Medicare Other

## 2014-12-14 ENCOUNTER — Encounter (HOSPITAL_COMMUNITY)
Admission: RE | Admit: 2014-12-14 | Discharge: 2014-12-14 | Disposition: A | Discharge status: Home / Self Care | Payer: Medicare Other | Source: Ambulatory Visit | Attending: Nephrology | Admitting: Nephrology

## 2014-12-14 DIAGNOSIS — N184 Chronic kidney disease, stage 4 (severe): Secondary | ICD-10-CM | POA: Insufficient documentation

## 2014-12-14 DIAGNOSIS — D631 Anemia in chronic kidney disease: Secondary | ICD-10-CM | POA: Insufficient documentation

## 2014-12-14 LAB — IRON AND TIBC
Iron: 89 ug/dL (ref 28–170)
SATURATION RATIOS: 37 % — AB (ref 10.4–31.8)
TIBC: 242 ug/dL — ABNORMAL LOW (ref 250–450)
UIBC: 153 ug/dL

## 2014-12-14 LAB — FERRITIN: Ferritin: 845 ng/mL — ABNORMAL HIGH (ref 11–307)

## 2014-12-14 LAB — POCT HEMOGLOBIN-HEMACUE: Hemoglobin: 10 g/dL — ABNORMAL LOW (ref 12.0–15.0)

## 2014-12-14 MED ORDER — CLONIDINE HCL 0.1 MG PO TABS: 0.1000 mg | ORAL_TABLET | ORAL | Status: DC | PRN

## 2014-12-14 MED ORDER — EPOETIN ALFA 40000 UNIT/ML IJ SOLN
INTRAMUSCULAR | Status: AC
  Administered 2014-12-14: 40000 [IU] via SUBCUTANEOUS
  Filled 2014-12-14: qty 1

## 2014-12-14 MED ORDER — EPOETIN ALFA 10000 UNIT/ML IJ SOLN
INTRAMUSCULAR | Status: AC
  Administered 2014-12-14: 10000 [IU] via SUBCUTANEOUS
  Filled 2014-12-14: qty 1

## 2014-12-14 MED ORDER — EPOETIN ALFA 40000 UNIT/ML IJ SOLN: 50000.0000 [IU] | INTRAMUSCULAR | Status: DC

## 2014-12-19 NOTE — Addendum Note (Signed)
Addended by: Hulan Fray on: 12/19/2014 06:35 PM   Modules accepted: Orders

## 2014-12-25 ENCOUNTER — Encounter: Payer: Medicare Other | Admitting: Internal Medicine

## 2014-12-31 ENCOUNTER — Other Ambulatory Visit: Payer: Self-pay | Admitting: Internal Medicine

## 2015-01-03 ENCOUNTER — Encounter (HOSPITAL_COMMUNITY)
Admission: RE | Admit: 2015-01-03 | Discharge: 2015-01-03 | Disposition: A | Discharge status: Home / Self Care | Payer: Medicare Other | Source: Ambulatory Visit | Attending: Nephrology | Admitting: Nephrology

## 2015-01-03 DIAGNOSIS — N184 Chronic kidney disease, stage 4 (severe): Secondary | ICD-10-CM | POA: Insufficient documentation

## 2015-01-03 DIAGNOSIS — D631 Anemia in chronic kidney disease: Secondary | ICD-10-CM | POA: Diagnosis not present

## 2015-01-03 LAB — POCT HEMOGLOBIN-HEMACUE: HEMOGLOBIN: 9.2 g/dL — AB (ref 12.0–15.0)

## 2015-01-03 MED ORDER — EPOETIN ALFA 40000 UNIT/ML IJ SOLN
INTRAMUSCULAR | Status: AC
  Administered 2015-01-03: 40000 [IU]
  Filled 2015-01-03: qty 1

## 2015-01-03 MED ORDER — EPOETIN ALFA 10000 UNIT/ML IJ SOLN
INTRAMUSCULAR | Status: AC
  Administered 2015-01-03: 10000 [IU]
  Filled 2015-01-03: qty 1

## 2015-01-03 MED ORDER — CLONIDINE HCL 0.1 MG PO TABS: 0.1000 mg | ORAL_TABLET | ORAL | Status: DC | PRN

## 2015-01-03 MED ORDER — EPOETIN ALFA 40000 UNIT/ML IJ SOLN
50000.0000 [IU] | INTRAMUSCULAR | Status: DC
Start: 2015-01-03 — End: 2015-01-04

## 2015-01-07 ENCOUNTER — Other Ambulatory Visit: Payer: Self-pay | Admitting: Internal Medicine

## 2015-01-18 ENCOUNTER — Encounter (HOSPITAL_COMMUNITY): Payer: Medicare Other

## 2015-01-29 ENCOUNTER — Other Ambulatory Visit: Payer: Self-pay | Admitting: Internal Medicine

## 2015-02-07 ENCOUNTER — Other Ambulatory Visit: Payer: Self-pay | Admitting: *Deleted

## 2015-02-08 NOTE — Telephone Encounter (Signed)
Patient needs appointment.

## 2015-02-08 NOTE — Telephone Encounter (Signed)
Message sent to front desk pool regarding appt per Dr Arcelia Jew.

## 2015-02-16 ENCOUNTER — Encounter: Payer: Self-pay | Admitting: Internal Medicine

## 2015-02-19 ENCOUNTER — Encounter: Payer: Medicare Other | Admitting: Internal Medicine

## 2015-02-21 ENCOUNTER — Other Ambulatory Visit: Payer: Self-pay | Admitting: Internal Medicine

## 2015-02-21 NOTE — Telephone Encounter (Signed)
Pt requesting tylenol # 3 and Januvia to be filled @ [facility] on [address].

## 2015-02-22 MED ORDER — ACETAMINOPHEN-CODEINE #3 300-30 MG PO TABS: 1.0000 | ORAL_TABLET | Freq: Two times a day (BID) | ORAL | Status: DC

## 2015-02-22 MED ORDER — SITAGLIPTIN PHOSPHATE 50 MG PO TABS: 50.0000 mg | ORAL_TABLET | Freq: Every day | ORAL | Status: DC

## 2015-02-22 NOTE — Telephone Encounter (Signed)
Can we please phone in the Tylenol #3? Thanks!

## 2015-02-22 NOTE — Telephone Encounter (Signed)
Refill called in. 

## 2015-02-26 ENCOUNTER — Encounter: Payer: Self-pay | Admitting: Internal Medicine

## 2015-02-26 ENCOUNTER — Ambulatory Visit (INDEPENDENT_AMBULATORY_CARE_PROVIDER_SITE_OTHER): Payer: Medicare Other | Admitting: Internal Medicine

## 2015-02-26 VITALS — BP 169/69 | HR 76 | Temp 98.3°F | Ht 64.0 in | Wt 252.3 lb

## 2015-02-26 DIAGNOSIS — E1122 Type 2 diabetes mellitus with diabetic chronic kidney disease: Secondary | ICD-10-CM

## 2015-02-26 DIAGNOSIS — E1142 Type 2 diabetes mellitus with diabetic polyneuropathy: Secondary | ICD-10-CM | POA: Diagnosis not present

## 2015-02-26 DIAGNOSIS — I129 Hypertensive chronic kidney disease with stage 1 through stage 4 chronic kidney disease, or unspecified chronic kidney disease: Secondary | ICD-10-CM | POA: Diagnosis not present

## 2015-02-26 DIAGNOSIS — I152 Hypertension secondary to endocrine disorders: Secondary | ICD-10-CM

## 2015-02-26 DIAGNOSIS — Z23 Encounter for immunization: Secondary | ICD-10-CM

## 2015-02-26 DIAGNOSIS — M159 Polyosteoarthritis, unspecified: Secondary | ICD-10-CM

## 2015-02-26 DIAGNOSIS — Z7984 Long term (current) use of oral hypoglycemic drugs: Secondary | ICD-10-CM

## 2015-02-26 DIAGNOSIS — M19011 Primary osteoarthritis, right shoulder: Secondary | ICD-10-CM

## 2015-02-26 DIAGNOSIS — E1159 Type 2 diabetes mellitus with other circulatory complications: Secondary | ICD-10-CM

## 2015-02-26 DIAGNOSIS — Z0000 Encounter for general adult medical examination without abnormal findings: Secondary | ICD-10-CM

## 2015-02-26 DIAGNOSIS — I10 Essential (primary) hypertension: Secondary | ICD-10-CM

## 2015-02-26 DIAGNOSIS — Z794 Long term (current) use of insulin: Secondary | ICD-10-CM

## 2015-02-26 DIAGNOSIS — N189 Chronic kidney disease, unspecified: Secondary | ICD-10-CM | POA: Diagnosis not present

## 2015-02-26 DIAGNOSIS — E11649 Type 2 diabetes mellitus with hypoglycemia without coma: Secondary | ICD-10-CM

## 2015-02-26 DIAGNOSIS — M170 Bilateral primary osteoarthritis of knee: Secondary | ICD-10-CM

## 2015-02-26 DIAGNOSIS — M19012 Primary osteoarthritis, left shoulder: Secondary | ICD-10-CM

## 2015-02-26 DIAGNOSIS — R0602 Shortness of breath: Secondary | ICD-10-CM

## 2015-02-26 LAB — GLUCOSE, CAPILLARY: Glucose-Capillary: 109 mg/dL — ABNORMAL HIGH (ref 65–99)

## 2015-02-26 LAB — POCT GLYCOSYLATED HEMOGLOBIN (HGB A1C): Hemoglobin A1C: 5.8

## 2015-02-26 MED ORDER — MOMETASONE FUROATE 50 MCG/ACT NA SUSP: 2.0000 | Freq: Every day | NASAL | Status: DC

## 2015-02-26 MED ORDER — INSULIN GLARGINE 100 UNIT/ML SOLOSTAR PEN: PEN_INJECTOR | SUBCUTANEOUS | Status: DC

## 2015-02-26 MED ORDER — CLONIDINE HCL 0.3 MG PO TABS: 0.3000 mg | ORAL_TABLET | Freq: Two times a day (BID) | ORAL | Status: DC

## 2015-02-26 NOTE — Progress Notes (Signed)
   Subjective:    Patient ID: Joan Banks, female    DOB: [birth year], 70 y.o.   MRN: [id number]  HPI Ms. Cockayne is a 70yo woman with PMHx of HTN, COPD, type 2 diabetes with peripheral neuropathy, and CKD Stage 3 who presents today for follow up of her HTN.  HTN: BP moderately elevated today at 169/69. She takes Norvasc 10 mg daily, Atenolol 50 mg BID, Clonidine 0.3 mg BID, Enalapril 20 mg BID, and Lasix 40 mg daily.   Type 2 DM w/ Peripheral Neuropathy: Last HbA1c 6.2, today her HbA1c is 5.8. She takes Lantus 45 units QHS, Januvia 50 mg daily, and Glipizide 20 mg daily. She reports frequent low blood sugars down into the 40s. Per review of her glucometer, she has low blood sugars 4 times a week with the lowest CBG 46. She has hypoglycemia awareness and describes feeling sweaty, nervous, and nauseous when she gets low. She reports testing every time she feels low. For her peripheral neuropathy she was prescribed Desipramine 10 mg QHS, but never picked up the prescription because she did not know the name of the medication. She states the tingling/numbness in her legs does not happen frequently and does not bother her much.   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 She takes Tylenol #3 300-30 mg about 3 times per week on average. Her last dose was 2 weeks ago because she ran out. She does not feel limited in her activity due to pain.   Hypercalcemia: Her calcium was noted to be elevated at 10.7 (corrected to 10.9 with albumin) in March 2016. Per review of labs, her calcium has been elevated since 2013. Her Vitamin D-25 was low at 63 in Nov 2015. Her PTH level was elevated at 167. She is taking Vitamin D supplementation. She denies any constipation, nausea, anorexia, anxiety, history of kidney stones, or muscle weakness.  SOB: She reports feeling SOB for the last 2 weeks. She describes having trouble catching  her breath when walking. Her SOB improves with rest. She denies any chest pain. She notes having cold symptoms including congestion and sore throat. She attributes her SOB to these symptoms. She denies any fever, chills, productive cough, wheezing    Review of Systems General: Denies night sweats, changes in weight, changes in appetite HEENT: Denies headaches, ear pain, changes in vision CV: Denies palpitations, orthopnea Pulm: See HPI GI: Denies abdominal pain, vomiting, diarrhea, constipation, melena, hematochezia GU: Denies dysuria, hematuria, frequency Msk: See HPI Neuro: See HPI Skin: Denies rashes, bruising Psych: Denies depression, hallucinations    Objective:   Physical Exam General: elderly obese woman sitting up, NAD HEENT: [place]/AT, sclera anicteric, mild tenderness to palpation of maxillary sinuses, pharynx non-erythematous, mucus membranes moist CV: RRR, no m/g/r Pulm: CTA bilaterally, breaths non-labored Abd: BS+, soft, obese, non-tender Ext: warm, no peripheral edema Neuro: alert and oriented x 3, no focal deficits, strength intact in upper and lower extremities     Assessment & Plan:  Please refer to A&P documentation.

## 2015-02-26 NOTE — Patient Instructions (Signed)
-   Your high calcium level is likely related to your kidney disease. Dr. Posey Pronto is helping to manage this. - Decrease your Lantus to 40 units at bedtime - Continue to check your blood sugars 2-3 times daily - Follow up appointment in 1 month  General Instructions:   Thank you for bringing your medicines today. This helps [place] keep you safe from mistakes.   Progress Toward Treatment Goals:  Treatment Goal 08/28/2014  Hemoglobin A1C at goal  Blood pressure at goal    Self Care Goals & Plans:  Self Care Goal 02/26/2015  Manage my medications take my medicines as prescribed; refill my medications on time; bring my medications to every visit  Monitor my health keep track of my blood glucose; bring my glucose meter and log to each visit; keep track of my blood pressure; check my feet daily  Eat healthy foods eat more vegetables; eat foods that are low in salt; eat baked foods instead of fried foods  Be physically active find an activity I enjoy  Meeting treatment goals -    Home Blood Glucose Monitoring 08/28/2014  Check my blood sugar 2 times a day  When to check my blood sugar before meals     Care Management & Community Referrals:  Referral 05/01/2014  Referrals made for care management support -  Referrals made to community resources none

## 2015-03-01 DIAGNOSIS — R0602 Shortness of breath: Secondary | ICD-10-CM | POA: Insufficient documentation

## 2015-03-01 NOTE — Assessment & Plan Note (Signed)
Her hypercalcemia is most likely related to her chronic kidney disease given her elevated PTH (secondary hyperparathyroidism). She also follows with Dr. Posey Pronto (nephrology) who is helping to manage her hypercalcemia. We will continue vitamin D supplementation and monitor her calcium periodically.

## 2015-03-01 NOTE — Assessment & Plan Note (Addendum)
Her SOB has been occurring only for the last 1-2 weeks during exertion in the setting of cold-like symptoms. It's likely related to an upper respiratory infection but CAD should be kept on the back burner. She does have risk factors for CAD including uncontrolled HTN, DM, hyperlipidemia, and kidney disease. I do not suspect ACS at this time but if her SOB continues once her URI symptoms resolve then further work up is indicated. - Recommended conservative management for now, including good fluid intake and cough drops for sore throat

## 2015-03-01 NOTE — Progress Notes (Signed)
Internal Medicine Clinic Attending  Case discussed with Dr. Rivet soon after the resident saw the patient.  We reviewed the resident's history and exam and pertinent patient test results.  I agree with the assessment, diagnosis, and plan of care documented in the resident's note.  

## 2015-03-01 NOTE — Assessment & Plan Note (Signed)
Lab Results  Component Value Date   HGBA1C 5.8 02/26/2015   HGBA1C 6.2 08/28/2014   HGBA1C 7.5 05/01/2014     Assessment: Diabetes control: good control (HgbA1C at goal) Progress toward A1C goal:  at goal Comments: Her diabetes is overcontrolled as she is experiencing frequent episodes of hypoglycemia. She is symptomatic when she gets low blood sugar. Will decrease her Lantus dose.  Plan: Medications:  Decrease Lantus to 40 units QHS. Continue Januvia 50 mg daily and Glipizide 20 mg daily. I have given her another prescription for Despiramine 10 mg QHS for her peripheral neuropathy.  Home glucose monitoring: Frequency: 3 times a day Timing: before meals Instruction/counseling given: Discussed that she needs an eye exam, necessity for weight loss, nutrition Self management tools provided: copy of home glucose meter download Other plans:  - If still having hypoglycemia, can consider decreasing or stopping Glipizide - Referral for eye exam placed

## 2015-03-01 NOTE — Assessment & Plan Note (Signed)
-   Flu shot today - Patient would like to get repeat colonoscopy- referral placed to GI

## 2015-03-01 NOTE — Assessment & Plan Note (Addendum)
BP Readings from Last 3 Encounters:  02/26/15 169/69  01/03/15 148/68  12/14/14 153/69    Lab Results  Component Value Date   NA 143 05/01/2014   K 4.2 05/01/2014   CREATININE 1.68* 05/01/2014    Assessment: Blood pressure control: moderately elevated Progress toward BP goal:   Unchanged Comments: BP remains elevated. She reports taking all of her medications as prescribed and denies missing doses. It appears her BP has been difficult to control for quite some time. She is on 5 antihypertensives. I do not see any prior work up for secondary hypertension. I doubt pheochromocytoma or primary aldosteronism (no hx of hypokalemia). She does have OSA and noted to be this visit that she has not been using her CPAP machine because she needs a new one. It's possible her untreated OSA is contributing. I will check a TSH at her next visit as well as she does not have one in the chart and hypothyroidism can cause hypertension.  Plan: Medications:  Continue Amlodipine 10 mg daily, Atenolol 50 mg BID, Clonidine 0.3 mg BID, Lasix 40 mg daily, and Enalapril 20 mg BID. Other plans:  - Follow up in 4-6 weeks to recheck BP - Check TSH next visit - Discuss getting new CPAP machine - Can increase Clonidine to three times daily if needed. I changed her pills to 0.3 mg tablets so she does not have to take multiple pills at a time.

## 2015-03-01 NOTE — Assessment & Plan Note (Signed)
She takes Tylenol #3 appropriately for her shoulder and knee pain. We discussed the concept of a pain contract and she is willing to sign one. We will do this at her next visit. I will also obtain a UDS at this time.

## 2015-03-07 ENCOUNTER — Encounter (HOSPITAL_COMMUNITY)
Admission: RE | Admit: 2015-03-07 | Discharge: 2015-03-07 | Disposition: A | Discharge status: Home / Self Care | Payer: Medicare Other | Source: Ambulatory Visit | Attending: Nephrology | Admitting: Nephrology

## 2015-03-07 DIAGNOSIS — N184 Chronic kidney disease, stage 4 (severe): Secondary | ICD-10-CM | POA: Insufficient documentation

## 2015-03-07 DIAGNOSIS — D631 Anemia in chronic kidney disease: Secondary | ICD-10-CM | POA: Insufficient documentation

## 2015-03-07 LAB — IRON AND TIBC
IRON: 36 ug/dL (ref 28–170)
SATURATION RATIOS: 18 % (ref 10.4–31.8)
TIBC: 195 ug/dL — ABNORMAL LOW (ref 250–450)
UIBC: 159 ug/dL

## 2015-03-07 LAB — FERRITIN: Ferritin: 841 ng/mL — ABNORMAL HIGH (ref 11–307)

## 2015-03-07 LAB — POCT HEMOGLOBIN-HEMACUE: Hemoglobin: 7.8 g/dL — ABNORMAL LOW (ref 12.0–15.0)

## 2015-03-07 MED ORDER — EPOETIN ALFA 40000 UNIT/ML IJ SOLN
50000.0000 [IU] | INTRAMUSCULAR | Status: DC
  Administered 2015-03-07: 40000 [IU] via SUBCUTANEOUS

## 2015-03-07 MED ORDER — EPOETIN ALFA 40000 UNIT/ML IJ SOLN
INTRAMUSCULAR | Status: AC
  Filled 2015-03-07: qty 1

## 2015-03-07 MED ORDER — CLONIDINE HCL 0.1 MG PO TABS: 0.1000 mg | ORAL_TABLET | ORAL | Status: DC | PRN

## 2015-03-07 MED ORDER — EPOETIN ALFA 10000 UNIT/ML IJ SOLN
INTRAMUSCULAR | Status: AC
  Administered 2015-03-07: 10000 [IU] via SUBCUTANEOUS
  Filled 2015-03-07: qty 1

## 2015-03-07 NOTE — Progress Notes (Signed)
50,000 units Procrit given per order, pt encouraged to keep appointments, also she is to come weekly for hemocue  Checks until reach 9.0

## 2015-03-07 NOTE — Progress Notes (Addendum)
Pt here today for Procrit injection, hemoglobin 7.8; [facility] notified; to contact Dr. Posey Pronto.  Pt states she has missed several appoiontments due to illness.

## 2015-03-14 ENCOUNTER — Encounter (HOSPITAL_COMMUNITY)
Admission: RE | Admit: 2015-03-14 | Discharge: 2015-03-14 | Disposition: A | Discharge status: Home / Self Care | Payer: Medicare Other | Source: Ambulatory Visit | Attending: Nephrology | Admitting: Nephrology

## 2015-03-14 ENCOUNTER — Encounter: Payer: Self-pay | Admitting: Internal Medicine

## 2015-03-14 DIAGNOSIS — D631 Anemia in chronic kidney disease: Secondary | ICD-10-CM | POA: Diagnosis not present

## 2015-03-14 LAB — POCT HEMOGLOBIN-HEMACUE: Hemoglobin: 7.7 g/dL — ABNORMAL LOW (ref 12.0–15.0)

## 2015-03-14 MED ORDER — EPOETIN ALFA 40000 UNIT/ML IJ SOLN
INTRAMUSCULAR | Status: AC
  Administered 2015-03-14: 40000 [IU]
  Filled 2015-03-14: qty 1

## 2015-03-14 MED ORDER — CLONIDINE HCL 0.1 MG PO TABS: 0.1000 mg | ORAL_TABLET | ORAL | Status: DC | PRN

## 2015-03-14 MED ORDER — EPOETIN ALFA 40000 UNIT/ML IJ SOLN: 50000.0000 [IU] | INTRAMUSCULAR | Status: DC

## 2015-03-14 MED ORDER — EPOETIN ALFA 10000 UNIT/ML IJ SOLN
INTRAMUSCULAR | Status: AC
  Administered 2015-03-14: 10000 [IU]
  Filled 2015-03-14: qty 1

## 2015-03-21 ENCOUNTER — Encounter (HOSPITAL_COMMUNITY)
Admission: RE | Admit: 2015-03-21 | Discharge: 2015-03-21 | Disposition: A | Discharge status: Home / Self Care | Payer: Medicare Other | Source: Ambulatory Visit | Attending: Nephrology | Admitting: Nephrology

## 2015-03-21 DIAGNOSIS — D631 Anemia in chronic kidney disease: Secondary | ICD-10-CM | POA: Diagnosis not present

## 2015-03-21 LAB — POCT HEMOGLOBIN-HEMACUE: HEMOGLOBIN: 9.7 g/dL — AB (ref 12.0–15.0)

## 2015-03-21 MED ORDER — EPOETIN ALFA 10000 UNIT/ML IJ SOLN
INTRAMUSCULAR | Status: AC
  Administered 2015-03-21: 10000 [IU] via SUBCUTANEOUS
  Filled 2015-03-21: qty 1

## 2015-03-21 MED ORDER — EPOETIN ALFA 40000 UNIT/ML IJ SOLN
INTRAMUSCULAR | Status: AC
  Administered 2015-03-21: 40000 [IU] via SUBCUTANEOUS
  Filled 2015-03-21: qty 1

## 2015-03-21 MED ORDER — EPOETIN ALFA 40000 UNIT/ML IJ SOLN: 50000.0000 [IU] | INTRAMUSCULAR | Status: DC

## 2015-03-21 MED ORDER — CLONIDINE HCL 0.1 MG PO TABS: 0.1000 mg | ORAL_TABLET | ORAL | Status: DC | PRN

## 2015-04-02 ENCOUNTER — Encounter: Payer: Medicare Other | Admitting: Internal Medicine

## 2015-04-04 ENCOUNTER — Inpatient Hospital Stay (HOSPITAL_COMMUNITY): Admission: RE | Admit: 2015-04-04 | Payer: Medicare Other | Source: Ambulatory Visit

## 2015-04-09 ENCOUNTER — Ambulatory Visit (INDEPENDENT_AMBULATORY_CARE_PROVIDER_SITE_OTHER): Payer: Medicare Other | Admitting: Internal Medicine

## 2015-04-09 ENCOUNTER — Encounter: Payer: Self-pay | Admitting: Internal Medicine

## 2015-04-09 ENCOUNTER — Encounter (HOSPITAL_COMMUNITY)
Admission: RE | Admit: 2015-04-09 | Discharge: 2015-04-09 | Disposition: A | Discharge status: Home / Self Care | Payer: Medicare Other | Source: Ambulatory Visit | Attending: Nephrology | Admitting: Nephrology

## 2015-04-09 VITALS — BP 172/72 | HR 74 | Temp 97.7°F | Resp 18 | Ht 65.0 in | Wt 250.0 lb

## 2015-04-09 DIAGNOSIS — D631 Anemia in chronic kidney disease: Secondary | ICD-10-CM | POA: Diagnosis present

## 2015-04-09 DIAGNOSIS — E1159 Type 2 diabetes mellitus with other circulatory complications: Secondary | ICD-10-CM | POA: Diagnosis not present

## 2015-04-09 DIAGNOSIS — N184 Chronic kidney disease, stage 4 (severe): Secondary | ICD-10-CM | POA: Diagnosis not present

## 2015-04-09 DIAGNOSIS — I152 Hypertension secondary to endocrine disorders: Secondary | ICD-10-CM | POA: Diagnosis not present

## 2015-04-09 DIAGNOSIS — Z7984 Long term (current) use of oral hypoglycemic drugs: Secondary | ICD-10-CM

## 2015-04-09 DIAGNOSIS — Z794 Long term (current) use of insulin: Secondary | ICD-10-CM | POA: Diagnosis not present

## 2015-04-09 DIAGNOSIS — E1142 Type 2 diabetes mellitus with diabetic polyneuropathy: Secondary | ICD-10-CM | POA: Diagnosis not present

## 2015-04-09 DIAGNOSIS — I10 Essential (primary) hypertension: Principal | ICD-10-CM

## 2015-04-09 LAB — POCT HEMOGLOBIN-HEMACUE: Hemoglobin: 9.5 g/dL — ABNORMAL LOW (ref 12.0–15.0)

## 2015-04-09 LAB — IRON AND TIBC
IRON: 81 ug/dL (ref 28–170)
Saturation Ratios: 35 % — ABNORMAL HIGH (ref 10.4–31.8)
TIBC: 231 ug/dL — ABNORMAL LOW (ref 250–450)
UIBC: 150 ug/dL

## 2015-04-09 LAB — FERRITIN: Ferritin: 916 ng/mL — ABNORMAL HIGH (ref 11–307)

## 2015-04-09 MED ORDER — EPOETIN ALFA 40000 UNIT/ML IJ SOLN
50000.0000 [IU] | INTRAMUSCULAR | Status: DC
  Administered 2015-04-09: 50000 [IU] via SUBCUTANEOUS

## 2015-04-09 MED ORDER — INSULIN GLARGINE 100 UNIT/ML SOLOSTAR PEN: PEN_INJECTOR | SUBCUTANEOUS | Status: DC

## 2015-04-09 MED ORDER — EPOETIN ALFA 10000 UNIT/ML IJ SOLN
INTRAMUSCULAR | Status: AC
  Filled 2015-04-09: qty 1

## 2015-04-09 MED ORDER — CLONIDINE HCL 0.1 MG PO TABS: 0.1000 mg | ORAL_TABLET | ORAL | Status: DC | PRN

## 2015-04-09 MED ORDER — EPOETIN ALFA 40000 UNIT/ML IJ SOLN
INTRAMUSCULAR | Status: AC
  Administered 2015-04-09: 50000 [IU] via SUBCUTANEOUS
  Filled 2015-04-09: qty 1

## 2015-04-09 MED ORDER — SPIRONOLACTONE 25 MG PO TABS: 25.0000 mg | ORAL_TABLET | Freq: Every day | ORAL | Status: DC

## 2015-04-09 NOTE — Patient Instructions (Signed)
Stop taking Glipizide.  Reduce lantus to 30 units at night.  Keep checking your sugar at least 3 times a day.  Let us know if you still have low sugars <70.  Pick up the spironolactone after we give you a call and let you know to take it. Don't take lasix when you take this.

## 2015-04-09 NOTE — Assessment & Plan Note (Signed)
Continues to have hypoglycemia in 40's especially during fasting with her current regimen.  Glipizide has similar effect as insulin so will d/c glipizide.  Will also reduce her lantus from 40 units to 30 untis qhs Continue januvia 50mg  daily.  F/up in 1 month with blood sugar logs. Asked patient to call us if she still have hypoglycemic events.

## 2015-04-09 NOTE — Assessment & Plan Note (Addendum)
Filed Vitals:   04/09/15 0921  BP: 172/72  Pulse: 74  Temp: 97.7 F (36.5 C)  Resp: 18   BP remains high on on Norvasc 10 mg daily, Atenolol 50 mg BID, Clonidine 0.3 mg BID, Enalapril 20 mg BID, and Lasix 40 mg daily.  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 Will check BMET first before starting spironolactone today. Continue norvasc 10 + atenolol 50mg  bid + clonidine 0.3mg  bid + enalapril 20mg  bid. States compliance with her CPAP machine.   Addendum: BMET Results showed her K is 5.2. crt bumped up to 2.3 (b/l 1.7-1.9). No recent changes of her AceI.  -called patient and told her not to take the spironolactone. Also told her to not taking the lasix for now as she may be getting volume depleted with lasix.  Will check her again in 2 weeks with bmet off lasix.

## 2015-04-09 NOTE — Progress Notes (Signed)
   Subjective:    Patient ID: Joan Banks, female    DOB: August 06, 1945, 70 y.o.   MRN: [id number]  HPI  70 yo female with hx of HTN, COPD, DM II, CKD 3, hx of DCIS, HLD, presents for follow up of HTN and DM II.  HTN: bp remains elevated. She takes Norvasc 10 mg daily, Atenolol 50 mg BID, Clonidine 0.3 mg BID, Enalapril 20 mg BID, and Lasix 40 mg daily. States compliance with this.   DMI: had hypoglycemia events last visit. lantus was decreased to 40 units qhs. Also on januvia 50mg  daily + glipizide 10mg  daily. Continues to have sugar in 40's mostly during fasting checks.    Denies any complaint currently.   Review of Systems  Constitutional: Negative for fever, chills and fatigue.  HENT: Negative for congestion and sore throat.   Eyes: Negative for photophobia and visual disturbance.  Respiratory: Negative for cough, chest tightness and shortness of breath.   Cardiovascular: Negative for chest pain, palpitations and leg swelling.  Gastrointestinal: Negative for abdominal pain and abdominal distention.  Endocrine: Negative.   Genitourinary: Negative for dysuria and hematuria.  Musculoskeletal: Negative.   Skin: Negative.   Allergic/Immunologic: Negative.   Neurological: Negative.   Hematological: Negative.   Psychiatric/Behavioral: Negative.        Objective:   Physical Exam  Constitutional: She is oriented to person, place, and time. She appears well-developed and well-nourished.  Overweight female.   HENT:  Head: Normocephalic and atraumatic.  Eyes: EOM are normal. Pupils are equal, round, and reactive to light.  Neck: Normal range of motion.  Cardiovascular: Normal rate and regular rhythm.  Exam reveals no gallop and no friction rub.   No murmur heard. Pulmonary/Chest: Effort normal and breath sounds normal. No respiratory distress. She has no wheezes.  Abdominal: Soft. Bowel sounds are normal. She exhibits no distension. There is no tenderness.  Musculoskeletal:  Normal range of motion. She exhibits no edema or tenderness.  Neurological: She is alert and oriented to person, place, and time.  Skin: Skin is warm.     Filed Vitals:   04/09/15 0921  BP: 172/72  Pulse: 74  Temp: 97.7 F (36.5 C)  Resp: 18        Assessment & Plan:  See problem based a&p

## 2015-04-10 LAB — BASIC METABOLIC PANEL
BUN / CREAT RATIO: 12 (ref 11–26)
BUN: 28 mg/dL — ABNORMAL HIGH (ref 8–27)
CHLORIDE: 107 mmol/L — AB (ref 96–106)
CO2: 21 mmol/L (ref 18–29)
CREATININE: 2.32 mg/dL — AB (ref 0.57–1.00)
Calcium: 9.8 mg/dL (ref 8.7–10.3)
GFR calc Af Amer: 24 mL/min/{1.73_m2} — ABNORMAL LOW (ref >59)
GFR calc non Af Amer: 21 mL/min/{1.73_m2} — ABNORMAL LOW (ref >59)
GLUCOSE: 66 mg/dL (ref 65–99)
Potassium: 5.2 mmol/L (ref 3.5–5.2)
SODIUM: 146 mmol/L — AB (ref 134–144)

## 2015-04-10 NOTE — Progress Notes (Signed)
Internal Medicine Clinic Attending  Case discussed with Dr. Ahmed at the time of the visit.  We reviewed the resident's history and exam and pertinent patient test results.  I agree with the assessment, diagnosis, and plan of care documented in the resident's note. 

## 2015-04-12 MED FILL — Epoetin Alfa Inj 10000 Unit/ML: INTRAMUSCULAR | Qty: 1 | Status: AC

## 2015-04-12 MED FILL — Epoetin Alfa Inj 40000 Unit/ML: INTRAMUSCULAR | Qty: 1 | Status: AC

## 2015-04-19 ENCOUNTER — Other Ambulatory Visit: Payer: Self-pay | Admitting: *Deleted

## 2015-04-22 NOTE — Addendum Note (Signed)
Addended by: Hulan Fray on: 04/22/2015 05:26 PM   Modules accepted: Orders

## 2015-04-23 ENCOUNTER — Inpatient Hospital Stay (HOSPITAL_COMMUNITY): Admission: RE | Admit: 2015-04-23 | Payer: Medicare Other | Source: Ambulatory Visit

## 2015-04-23 ENCOUNTER — Ambulatory Visit: Payer: Medicare Other | Admitting: Internal Medicine

## 2015-04-23 ENCOUNTER — Encounter: Payer: Medicare Other | Admitting: Internal Medicine

## 2015-04-23 MED ORDER — ENALAPRIL MALEATE 20 MG PO TABS: 20.0000 mg | ORAL_TABLET | Freq: Two times a day (BID) | ORAL | Status: DC

## 2015-04-23 MED ORDER — SIMVASTATIN 20 MG PO TABS: 20.0000 mg | ORAL_TABLET | Freq: Every day | ORAL | Status: DC

## 2015-04-29 ENCOUNTER — Other Ambulatory Visit: Payer: Self-pay | Admitting: *Deleted

## 2015-04-29 NOTE — Telephone Encounter (Signed)
Refill request from pt's pharmacy.Despina Hidden Cassady3/27/201711:44 AM

## 2015-05-03 MED ORDER — ATENOLOL 50 MG PO TABS: 50.0000 mg | ORAL_TABLET | Freq: Two times a day (BID) | ORAL | Status: DC

## 2015-05-09 ENCOUNTER — Ambulatory Visit: Payer: Medicare Other | Admitting: Internal Medicine

## 2015-05-22 ENCOUNTER — Telehealth: Payer: Self-pay | Admitting: *Deleted

## 2015-05-22 NOTE — Telephone Encounter (Signed)
Looks like she was supposed to follow up 2 weeks after her 3/7 appointment. I would get her in as soon as she can. She needs to have a bmet. Thanks.

## 2015-05-22 NOTE — Telephone Encounter (Signed)
Dr rivet, when would you like to see this pt again? Does she need labs? Tracif. From the lab ask me to check on this

## 2015-05-22 NOTE — Telephone Encounter (Signed)
[place], please schedule pt for lab work and next available for dr Marine scientist

## 2015-05-24 NOTE — Telephone Encounter (Signed)
Spoke with patient this am about coming in next Tuesday 05-28-15 for lab work since she will already be at the hospital at [facility] for an injection.  Said she would try if transportation allowed.  But if not she is definitely coming to her appt on 06-11-15 @ 10:45 am.  Patient will be mailed an appt card.

## 2015-05-27 ENCOUNTER — Telehealth: Payer: Self-pay | Admitting: Internal Medicine

## 2015-05-27 ENCOUNTER — Other Ambulatory Visit: Payer: Self-pay | Admitting: Internal Medicine

## 2015-05-27 DIAGNOSIS — E1159 Type 2 diabetes mellitus with other circulatory complications: Secondary | ICD-10-CM

## 2015-05-27 DIAGNOSIS — I10 Essential (primary) hypertension: Principal | ICD-10-CM

## 2015-05-27 DIAGNOSIS — I152 Hypertension secondary to endocrine disorders: Secondary | ICD-10-CM

## 2015-05-27 NOTE — Telephone Encounter (Signed)
APPT. REMINDER CALL, NO ANSWER, NO VOICE MAIL °

## 2015-05-28 ENCOUNTER — Other Ambulatory Visit: Payer: Medicare Other

## 2015-05-28 ENCOUNTER — Inpatient Hospital Stay (HOSPITAL_COMMUNITY): Admission: RE | Admit: 2015-05-28 | Payer: Medicare Other | Source: Ambulatory Visit

## 2015-06-11 ENCOUNTER — Encounter: Payer: Medicare Other | Admitting: Internal Medicine

## 2015-06-18 ENCOUNTER — Other Ambulatory Visit: Payer: Self-pay

## 2015-06-18 NOTE — Telephone Encounter (Signed)
Have tried to call pt for appt, will continue

## 2015-06-18 NOTE — Telephone Encounter (Signed)
Pt requesting tylenol # 3 to be filled. °

## 2015-06-21 ENCOUNTER — Encounter (HOSPITAL_COMMUNITY)
Admission: RE | Admit: 2015-06-21 | Discharge: 2015-06-21 | Disposition: A | Discharge status: Home / Self Care | Payer: Medicare Other | Source: Ambulatory Visit | Attending: Nephrology | Admitting: Nephrology

## 2015-06-21 ENCOUNTER — Other Ambulatory Visit: Payer: Self-pay | Admitting: *Deleted

## 2015-06-21 DIAGNOSIS — D631 Anemia in chronic kidney disease: Secondary | ICD-10-CM | POA: Insufficient documentation

## 2015-06-21 DIAGNOSIS — N184 Chronic kidney disease, stage 4 (severe): Secondary | ICD-10-CM | POA: Insufficient documentation

## 2015-06-21 LAB — IRON AND TIBC
IRON: 51 ug/dL (ref 28–170)
Saturation Ratios: 24 % (ref 10.4–31.8)
TIBC: 211 ug/dL — ABNORMAL LOW (ref 250–450)
UIBC: 160 ug/dL

## 2015-06-21 LAB — FERRITIN: FERRITIN: 710 ng/mL — AB (ref 11–307)

## 2015-06-21 MED ORDER — EPOETIN ALFA 40000 UNIT/ML IJ SOLN
INTRAMUSCULAR | Status: AC
  Administered 2015-06-21: 40000 [IU] via SUBCUTANEOUS
  Filled 2015-06-21: qty 1

## 2015-06-21 MED ORDER — EPOETIN ALFA 40000 UNIT/ML IJ SOLN: 50000.0000 [IU] | INTRAMUSCULAR | Status: DC

## 2015-06-21 MED ORDER — CLONIDINE HCL 0.1 MG PO TABS: 0.1000 mg | ORAL_TABLET | ORAL | Status: DC | PRN

## 2015-06-21 MED ORDER — EPOETIN ALFA 10000 UNIT/ML IJ SOLN
INTRAMUSCULAR | Status: AC
  Administered 2015-06-21: 10000 [IU] via SUBCUTANEOUS
  Filled 2015-06-21: qty 1

## 2015-06-21 MED ORDER — ACETAMINOPHEN-CODEINE #3 300-30 MG PO TABS: 1.0000 | ORAL_TABLET | Freq: Two times a day (BID) | ORAL | Status: DC

## 2015-06-21 NOTE — Telephone Encounter (Signed)
Next appt: 6/20

## 2015-06-21 NOTE — Telephone Encounter (Signed)
appt made, request sent

## 2015-06-24 LAB — POCT HEMOGLOBIN-HEMACUE: HEMOGLOBIN: 8 g/dL — AB (ref 12.0–15.0)

## 2015-06-24 NOTE — Addendum Note (Signed)
Addended by: Hulan Fray on: 06/24/2015 07:09 PM   Modules accepted: Orders

## 2015-06-26 NOTE — Telephone Encounter (Signed)
Called to pharm 

## 2015-07-15 ENCOUNTER — Other Ambulatory Visit: Payer: Self-pay

## 2015-07-15 DIAGNOSIS — R0602 Shortness of breath: Secondary | ICD-10-CM

## 2015-07-15 MED ORDER — MOMETASONE FUROATE 50 MCG/ACT NA SUSP: 2.0000 | Freq: Every day | NASAL | Status: DC

## 2015-07-15 MED ORDER — OMEPRAZOLE 20 MG PO CPDR: 40.0000 mg | DELAYED_RELEASE_CAPSULE | Freq: Every day | ORAL | Status: DC

## 2015-07-20 ENCOUNTER — Other Ambulatory Visit: Payer: Self-pay | Admitting: Internal Medicine

## 2015-07-23 ENCOUNTER — Encounter: Payer: Self-pay | Admitting: Internal Medicine

## 2015-07-23 ENCOUNTER — Ambulatory Visit: Payer: Medicare Other | Admitting: Internal Medicine

## 2015-07-30 NOTE — Addendum Note (Signed)
Addended by: Orson Gear on: 07/30/2015 09:25 AM   Modules accepted: Orders

## 2015-08-03 DIAGNOSIS — G4733 Obstructive sleep apnea (adult) (pediatric): Secondary | ICD-10-CM | POA: Diagnosis not present

## 2015-08-03 DIAGNOSIS — J449 Chronic obstructive pulmonary disease, unspecified: Secondary | ICD-10-CM | POA: Diagnosis not present

## 2015-09-03 DIAGNOSIS — G4733 Obstructive sleep apnea (adult) (pediatric): Secondary | ICD-10-CM | POA: Diagnosis not present

## 2015-09-03 DIAGNOSIS — J449 Chronic obstructive pulmonary disease, unspecified: Secondary | ICD-10-CM | POA: Diagnosis not present

## 2015-09-18 ENCOUNTER — Other Ambulatory Visit: Payer: Self-pay

## 2015-09-18 NOTE — Telephone Encounter (Signed)
Requesting tylenol to be filled @ [facility] on [address].

## 2015-09-20 ENCOUNTER — Other Ambulatory Visit: Payer: Self-pay | Admitting: Internal Medicine

## 2015-09-23 MED ORDER — ACETAMINOPHEN-CODEINE #3 300-30 MG PO TABS: 1.0000 | ORAL_TABLET | Freq: Two times a day (BID) | ORAL | 0 refills | Status: DC

## 2015-09-23 NOTE — Telephone Encounter (Signed)
Patient must have appointment. I will not refill again unless I see her.

## 2015-09-23 NOTE — Telephone Encounter (Signed)
Called in to pharmacy and asked to inform patient she needed an appointment with [place].

## 2015-09-27 ENCOUNTER — Encounter (HOSPITAL_COMMUNITY): Payer: Medicare Other

## 2015-10-04 DIAGNOSIS — J449 Chronic obstructive pulmonary disease, unspecified: Secondary | ICD-10-CM | POA: Diagnosis not present

## 2015-10-04 DIAGNOSIS — G4733 Obstructive sleep apnea (adult) (pediatric): Secondary | ICD-10-CM | POA: Diagnosis not present

## 2015-10-12 ENCOUNTER — Other Ambulatory Visit: Payer: Self-pay | Admitting: Internal Medicine

## 2015-10-12 DIAGNOSIS — R0602 Shortness of breath: Secondary | ICD-10-CM

## 2015-10-15 DIAGNOSIS — G4733 Obstructive sleep apnea (adult) (pediatric): Secondary | ICD-10-CM | POA: Diagnosis not present

## 2015-10-17 ENCOUNTER — Other Ambulatory Visit: Payer: Self-pay | Admitting: Internal Medicine

## 2015-10-24 ENCOUNTER — Other Ambulatory Visit: Payer: Self-pay | Admitting: Internal Medicine

## 2015-11-03 DIAGNOSIS — G4733 Obstructive sleep apnea (adult) (pediatric): Secondary | ICD-10-CM | POA: Diagnosis not present

## 2015-11-08 ENCOUNTER — Other Ambulatory Visit: Payer: Self-pay | Admitting: Internal Medicine

## 2015-11-08 NOTE — Telephone Encounter (Signed)
Tylenol #3 rx faxed to [place].

## 2015-11-08 NOTE — Telephone Encounter (Signed)
Pt also asking for glipizide

## 2015-11-12 ENCOUNTER — Other Ambulatory Visit: Payer: Self-pay | Admitting: Internal Medicine

## 2015-12-04 DIAGNOSIS — J449 Chronic obstructive pulmonary disease, unspecified: Secondary | ICD-10-CM | POA: Diagnosis not present

## 2015-12-10 ENCOUNTER — Encounter: Payer: Medicare Other | Admitting: Internal Medicine

## 2016-01-03 DIAGNOSIS — J449 Chronic obstructive pulmonary disease, unspecified: Secondary | ICD-10-CM | POA: Diagnosis not present

## 2016-01-16 DIAGNOSIS — G4733 Obstructive sleep apnea (adult) (pediatric): Secondary | ICD-10-CM | POA: Diagnosis not present

## 2016-01-17 ENCOUNTER — Other Ambulatory Visit: Payer: Self-pay | Admitting: Internal Medicine

## 2016-01-28 ENCOUNTER — Other Ambulatory Visit: Payer: Self-pay | Admitting: Internal Medicine

## 2016-01-28 DIAGNOSIS — R0602 Shortness of breath: Secondary | ICD-10-CM

## 2016-01-29 ENCOUNTER — Other Ambulatory Visit: Payer: Self-pay | Admitting: Internal Medicine

## 2016-02-03 DIAGNOSIS — J449 Chronic obstructive pulmonary disease, unspecified: Secondary | ICD-10-CM | POA: Diagnosis not present

## 2016-02-10 ENCOUNTER — Other Ambulatory Visit: Payer: Self-pay | Admitting: Dietician

## 2016-02-10 DIAGNOSIS — E1142 Type 2 diabetes mellitus with diabetic polyneuropathy: Secondary | ICD-10-CM

## 2016-02-12 MED ORDER — ACCU-CHEK FASTCLIX LANCETS MISC: 5 refills | Status: DC

## 2016-02-12 MED ORDER — ACCU-CHEK AVIVA PLUS W/DEVICE KIT: PACK | 0 refills | Status: DC

## 2016-02-12 MED ORDER — GLUCOSE BLOOD VI STRP: ORAL_STRIP | 5 refills | Status: DC

## 2016-02-12 NOTE — Telephone Encounter (Signed)
Patient left voicemail that she needs a meter as they do not make hers anymore. She checks twice a day and would like one that has a large readout because she has poor vision. She would like the prescriptions sent to [facility] on [address]

## 2016-02-17 ENCOUNTER — Encounter: Payer: Self-pay | Admitting: Internal Medicine

## 2016-03-05 ENCOUNTER — Telehealth: Payer: Self-pay | Admitting: Internal Medicine

## 2016-03-05 DIAGNOSIS — G4730 Sleep apnea, unspecified: Secondary | ICD-10-CM

## 2016-03-05 DIAGNOSIS — J449 Chronic obstructive pulmonary disease, unspecified: Secondary | ICD-10-CM | POA: Diagnosis not present

## 2016-03-05 NOTE — Telephone Encounter (Signed)
No problem. I will put DME order in.

## 2016-03-05 NOTE — Telephone Encounter (Signed)
Patient requesting a new CPAP.  Patient states her machine is over 73 years old and she qualifies for a new one.  A DME CPAP MACHINE ORDER can be placed and I will fax it in to [facility] for the patient for verification.

## 2016-03-16 NOTE — Progress Notes (Signed)
CC: Hypertension  HPI:  [name] is a 71 y.o. woman with PMHx as noted below who presents today for follow up of her hypertension.  HTN: BP elevated at 156/68. She is taking Atenolol 50 mg BID, Clonidine 0.3 mg BID, Enalapril 20 mg BID, and Amlodipine 10 mg daily. She was on Spironolactone but states she stopped taking this over a year ago when she ran out.  Type 2 DM: Last A1c 5.8, today is 7.3. She self-increased her Lantus to 45 units QHS from 30 units. She is also taking Januvia 50 mg daily. She denies any blurry vision, polyuria, and polydipsia.   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 Baseline Cr in 1.7-1.9 range. Last Cr 2.3 in March 2017 but has not followed up since that time. Denies any difficulties urinating.  OSA: Reports she has not been able to use her CPAP machine because it is broken. She complains of daytime headaches and poor sleep at night. She had been using CPAP nightly before the machine malfunctioned.  Chronic Bilateral Knee Pain: Reports her pain is stable in both of her knees. She has been taking Tylenol on a as needed basis. She will take two 500 mg tablets as needed, but this is not daily. She was previously on Tylenol #3 but has been out of this medication for over 4 months now. She has tried topical agents in the past without much relief.   Past Medical History:  Diagnosis Date  . Anemia, iron deficiency   . 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 Eston Esters.  . Chronic renal insufficiency   . COPD (chronic obstructive pulmonary disease) ([place])   . Dental caries   . Diabetes mellitus   . Diabetic peripheral neuropathy ([place])   . Gastritis and duodenitis 1996   H. pylori positive  . Hyperlipidemia   . Hypertension   . Low back pain   . OSA (obstructive sleep apnea)    Sleep study 12/23/2003 showed very  severe obstructive sleep apnea/hypopnea syndrome, RDI 136 per hour, with severe oxygen desaturation to 50% on room air before CPAP.  Marland Kitchen Uterine leiomyoma    Hx of massive leiomyomata uteri; S/P total abdominal hysterectomy, right salpingo-oophorectomy, and debulking of benign fibroid from vesicouterine space 11/18/99   . Visual impairment     Review of Systems:   General: Denies fever, chills, night sweats, changes in weight, changes in appetite HEENT: Denies headaches, ear pain, changes in vision, rhinorrhea, sore throat CV: Denies CP, palpitations, SOB, orthopnea Pulm: Denies SOB, cough, wheezing GI: Denies abdominal pain, nausea, vomiting, diarrhea, constipation, melena, hematochezia GU: Denies dysuria, hematuria, frequency Msk: Denies muscle cramps Neuro: Denies weakness, numbness, tingling Skin: Denies rashes, bruising Psych: Denies depression, anxiety, hallucinations  Physical Exam:  Vitals:   03/17/16 1434 03/17/16 1509  BP: (!) 156/68 (!) 158/71  Pulse: 77 78  Temp: 98.4 F (36.9 C)   TempSrc: Oral   SpO2: 99% 96%  Weight: 240 lb 14.4 oz (109.3 kg)    General: Elderly obese woman sitting up in chair, NAD HEENT: [place]/AT, sclera anicteric, mucus membranes moist CV: RRR, systolic murmur heard Pulm: CTA bilaterally, breaths non-labored Ext: 1+ peripheral edema, distal pulses 2+ Skin: No sores or ulcers on feet  Assessment & Plan:   See Encounters Tab for problem based charting.  Patient discussed with Dr. Johnette Abraham.  Heber [place]

## 2016-03-17 ENCOUNTER — Encounter (INDEPENDENT_AMBULATORY_CARE_PROVIDER_SITE_OTHER): Payer: Self-pay

## 2016-03-17 ENCOUNTER — Ambulatory Visit (INDEPENDENT_AMBULATORY_CARE_PROVIDER_SITE_OTHER): Payer: Medicare Other | Admitting: Internal Medicine

## 2016-03-17 ENCOUNTER — Encounter: Payer: Self-pay | Admitting: Internal Medicine

## 2016-03-17 VITALS — BP 158/71 | HR 78 | Temp 98.4°F | Wt 240.9 lb

## 2016-03-17 DIAGNOSIS — E1122 Type 2 diabetes mellitus with diabetic chronic kidney disease: Secondary | ICD-10-CM

## 2016-03-17 DIAGNOSIS — Z6841 Body Mass Index (BMI) 40.0 and over, adult: Secondary | ICD-10-CM

## 2016-03-17 DIAGNOSIS — G4733 Obstructive sleep apnea (adult) (pediatric): Secondary | ICD-10-CM

## 2016-03-17 DIAGNOSIS — Z9989 Dependence on other enabling machines and devices: Secondary | ICD-10-CM

## 2016-03-17 DIAGNOSIS — Z1239 Encounter for other screening for malignant neoplasm of breast: Secondary | ICD-10-CM

## 2016-03-17 DIAGNOSIS — G8929 Other chronic pain: Secondary | ICD-10-CM | POA: Diagnosis not present

## 2016-03-17 DIAGNOSIS — Z0000 Encounter for general adult medical examination without abnormal findings: Secondary | ICD-10-CM

## 2016-03-17 DIAGNOSIS — N183 Chronic kidney disease, stage 3 unspecified: Secondary | ICD-10-CM

## 2016-03-17 DIAGNOSIS — G4730 Sleep apnea, unspecified: Secondary | ICD-10-CM

## 2016-03-17 DIAGNOSIS — E1159 Type 2 diabetes mellitus with other circulatory complications: Secondary | ICD-10-CM

## 2016-03-17 DIAGNOSIS — I152 Hypertension secondary to endocrine disorders: Secondary | ICD-10-CM | POA: Diagnosis not present

## 2016-03-17 DIAGNOSIS — E669 Obesity, unspecified: Secondary | ICD-10-CM | POA: Diagnosis not present

## 2016-03-17 DIAGNOSIS — R51 Headache: Secondary | ICD-10-CM | POA: Diagnosis not present

## 2016-03-17 DIAGNOSIS — E1142 Type 2 diabetes mellitus with diabetic polyneuropathy: Secondary | ICD-10-CM

## 2016-03-17 DIAGNOSIS — M25561 Pain in right knee: Secondary | ICD-10-CM

## 2016-03-17 DIAGNOSIS — M25562 Pain in left knee: Secondary | ICD-10-CM

## 2016-03-17 DIAGNOSIS — Z87891 Personal history of nicotine dependence: Secondary | ICD-10-CM

## 2016-03-17 DIAGNOSIS — Z794 Long term (current) use of insulin: Secondary | ICD-10-CM

## 2016-03-17 DIAGNOSIS — Z79899 Other long term (current) drug therapy: Secondary | ICD-10-CM

## 2016-03-17 DIAGNOSIS — I10 Essential (primary) hypertension: Secondary | ICD-10-CM

## 2016-03-17 LAB — POCT GLYCOSYLATED HEMOGLOBIN (HGB A1C): Hemoglobin A1C: 7.3

## 2016-03-17 LAB — GLUCOSE, CAPILLARY: Glucose-Capillary: 108 mg/dL — ABNORMAL HIGH (ref 65–99)

## 2016-03-17 MED ORDER — VITAMIN D3 25 MCG (1000 UT) PO CAPS: 1.0000 | ORAL_CAPSULE | Freq: Every day | ORAL | 6 refills | Status: DC

## 2016-03-17 MED ORDER — IPRATROPIUM-ALBUTEROL 20-100 MCG/ACT IN AERS: 1.0000 | INHALATION_SPRAY | Freq: Four times a day (QID) | RESPIRATORY_TRACT | 5 refills | Status: DC

## 2016-03-17 MED ORDER — CLONIDINE HCL 0.3 MG PO TABS: 0.3000 mg | ORAL_TABLET | Freq: Three times a day (TID) | ORAL | 5 refills | Status: AC

## 2016-03-17 MED ORDER — INSULIN GLARGINE 100 UNIT/ML SOLOSTAR PEN: 45.0000 [IU] | PEN_INJECTOR | Freq: Every day | SUBCUTANEOUS | 5 refills | Status: DC

## 2016-03-17 MED ORDER — ENALAPRIL MALEATE 20 MG PO TABS: 20.0000 mg | ORAL_TABLET | Freq: Two times a day (BID) | ORAL | 3 refills | Status: DC

## 2016-03-17 MED ORDER — OMEPRAZOLE 20 MG PO CPDR: 40.0000 mg | DELAYED_RELEASE_CAPSULE | Freq: Every day | ORAL | 2 refills | Status: DC

## 2016-03-17 MED ORDER — ATENOLOL 50 MG PO TABS: 50.0000 mg | ORAL_TABLET | Freq: Two times a day (BID) | ORAL | 1 refills | Status: DC

## 2016-03-17 MED ORDER — SITAGLIPTIN PHOSPHATE 50 MG PO TABS: 50.0000 mg | ORAL_TABLET | Freq: Every day | ORAL | 3 refills | Status: AC

## 2016-03-17 MED ORDER — SIMVASTATIN 20 MG PO TABS: 20.0000 mg | ORAL_TABLET | Freq: Every day | ORAL | 3 refills | Status: AC

## 2016-03-17 NOTE — Assessment & Plan Note (Signed)
BP elevated. Will increase Clonidine from 0.3 mg BID to TID. Continue Atenolol 50 mg BID, Enalapril 20 mg BID, and Amlodipine 10 mg daily. Will stop Spironolactone completely as she has CKD. Check bmet today.

## 2016-03-17 NOTE — Assessment & Plan Note (Signed)
Will check a bmet today to assess her kidney function. I suspect her Cr may worsen given her uncontrolled hypertension and diabetes. Will work on improving these risk factors.

## 2016-03-17 NOTE — Assessment & Plan Note (Signed)
A1c has increased from 5.8 to 7.3. She has not followed up closely. She did not bring her glucometer today so I could not assess her daily blood sugars. She self-increased her Lantus from 30 to 45 units QHS. Advised her to continue the current Lantus dosing and Januvia 50 mg daily. She will bring her glucometer to her next visit in 2 months and we will reassess at that time. Reminded her to make an eye appointment. Foot exam completed today- no evidence of ulcers/sores. Discussed good foot care practices.

## 2016-03-17 NOTE — Assessment & Plan Note (Signed)
Mammogram and DEXA scan ordered 

## 2016-03-17 NOTE — Patient Instructions (Addendum)
General Instructions: - Increase Clonidine to 0.3 mg three times daily - We will schedule your mammogram  - Please schedule an appointment with your eye doctor - Try taking Tylenol 1000 mg twice daily as needed for your knee and back pain - Follow up in 2 months  Thank you for bringing your medicines today. This helps [place] keep you safe from mistakes.   Progress Toward Treatment Goals:  Treatment Goal 02/26/2015  Hemoglobin A1C at goal  Blood pressure -    Self Care Goals & Plans:  Self Care Goal 02/26/2015  Manage my medications take my medicines as prescribed; refill my medications on time; bring my medications to every visit  Monitor my health keep track of my blood glucose; bring my glucose meter and log to each visit; keep track of my blood pressure; check my feet daily  Eat healthy foods eat more vegetables; eat foods that are low in salt; eat baked foods instead of fried foods  Be physically active find an activity I enjoy  Meeting treatment goals -    Home Blood Glucose Monitoring 02/26/2015  Check my blood sugar 3 times a day  When to check my blood sugar before meals     Care Management & Community Referrals:  Referral 05/01/2014  Referrals made for care management support -  Referrals made to community resources none

## 2016-03-17 NOTE — Assessment & Plan Note (Signed)
Her pain remains stable and is responsive to Tylenol therapy. She was previously on Tylenol #3 for several years, but has gone 4 months without this medication. Will see if we can control her pain with Tylenol. Can discuss topical agents and steroid injections at next visit. I would like to keep her off opioids if possible given her age, fall risk, and worsening kidney function.

## 2016-03-17 NOTE — Assessment & Plan Note (Signed)
Patient with poor sleep and daytime headaches. She has not been able to use her CPAP machine as it is broken. CPAP machine reordered. Titration 4-20.

## 2016-03-18 LAB — CBC WITH DIFFERENTIAL/PLATELET
Basophils Absolute: 0 10*3/uL (ref 0.0–0.2)
Basos: 0 %
EOS (ABSOLUTE): 0.2 10*3/uL (ref 0.0–0.4)
Eos: 2 %
Hematocrit: 28.2 % — ABNORMAL LOW (ref 34.0–46.6)
Hemoglobin: 8.1 g/dL — ABNORMAL LOW (ref 11.1–15.9)
Immature Grans (Abs): 0 10*3/uL (ref 0.0–0.1)
Immature Granulocytes: 0 %
LYMPHS: 21 %
Lymphocytes Absolute: 1.7 10*3/uL (ref 0.7–3.1)
MCH: 23.1 pg — ABNORMAL LOW (ref 26.6–33.0)
MCHC: 28.7 g/dL — ABNORMAL LOW (ref 31.5–35.7)
MCV: 81 fL (ref 79–97)
MONOS ABS: 0.6 10*3/uL (ref 0.1–0.9)
Monocytes: 7 %
Neutrophils Absolute: 5.5 10*3/uL (ref 1.4–7.0)
Neutrophils: 70 %
Platelets: 261 10*3/uL (ref 150–379)
RBC: 3.5 x10E6/uL — AB (ref 3.77–5.28)
RDW: 15.8 % — AB (ref 12.3–15.4)
WBC: 7.9 10*3/uL (ref 3.4–10.8)

## 2016-03-18 LAB — BMP8+ANION GAP
Anion Gap: 20 mmol/L — ABNORMAL HIGH (ref 10.0–18.0)
BUN/Creatinine Ratio: 17 (ref 12–28)
BUN: 58 mg/dL — AB (ref 8–27)
CHLORIDE: 113 mmol/L — AB (ref 96–106)
CO2: 17 mmol/L — ABNORMAL LOW (ref 18–29)
CREATININE: 3.33 mg/dL — AB (ref 0.57–1.00)
Calcium: 9.7 mg/dL (ref 8.7–10.3)
GFR calc non Af Amer: 13 mL/min/{1.73_m2} — ABNORMAL LOW (ref >59)
GFR, EST AFRICAN AMERICAN: 15 mL/min/{1.73_m2} — AB (ref >59)
GLUCOSE: 79 mg/dL (ref 65–99)
Potassium: 5.3 mmol/L — ABNORMAL HIGH (ref 3.5–5.2)
Sodium: 150 mmol/L — ABNORMAL HIGH (ref 134–144)

## 2016-03-18 LAB — LIPID PANEL
Chol/HDL Ratio: 3.7 ratio units (ref 0.0–4.4)
Cholesterol, Total: 128 mg/dL (ref 100–199)
HDL: 35 mg/dL — ABNORMAL LOW (ref >39)
LDL Calculated: 66 mg/dL (ref 0–99)
Triglycerides: 134 mg/dL (ref 0–149)
VLDL CHOLESTEROL CAL: 27 mg/dL (ref 5–40)

## 2016-03-19 NOTE — Progress Notes (Signed)
Internal Medicine Clinic Attending  Case discussed with Dr. Rivet soon after the resident saw the patient.  We reviewed the resident's history and exam and pertinent patient test results.  I agree with the assessment, diagnosis, and plan of care documented in the resident's note.  

## 2016-03-20 DIAGNOSIS — G4733 Obstructive sleep apnea (adult) (pediatric): Secondary | ICD-10-CM | POA: Diagnosis not present

## 2016-03-20 DIAGNOSIS — J449 Chronic obstructive pulmonary disease, unspecified: Secondary | ICD-10-CM | POA: Diagnosis not present

## 2016-03-23 ENCOUNTER — Telehealth: Payer: Self-pay

## 2016-03-23 NOTE — Telephone Encounter (Signed)
Needs to speak with a nurse regarding pain meds. Please call back.

## 2016-03-24 NOTE — Telephone Encounter (Signed)
Called pt at [phone], rang until it cut off automatically, will try again

## 2016-03-25 NOTE — Telephone Encounter (Signed)
No answer

## 2016-03-27 NOTE — Telephone Encounter (Signed)
No answer

## 2016-03-31 ENCOUNTER — Other Ambulatory Visit: Payer: Self-pay | Admitting: Internal Medicine

## 2016-03-31 NOTE — Telephone Encounter (Signed)
Called pt, no answer, no vmail 

## 2016-03-31 NOTE — Telephone Encounter (Signed)
Pain med refill °

## 2016-03-31 NOTE — Telephone Encounter (Signed)
I discussed with patient at her last visit that I would like to see if we can control her pain with Tylenol alone. She can take up to 3000 mg daily if needed. She was only taking 500 mg daily when I talked with her at her last visit. Can discuss Tylenol #3 further at her next visit.

## 2016-04-01 NOTE — Telephone Encounter (Signed)
No answer, no vmail 

## 2016-04-02 DIAGNOSIS — J449 Chronic obstructive pulmonary disease, unspecified: Secondary | ICD-10-CM | POA: Diagnosis not present

## 2016-04-03 NOTE — Telephone Encounter (Signed)
No answer

## 2016-04-17 DIAGNOSIS — G4733 Obstructive sleep apnea (adult) (pediatric): Secondary | ICD-10-CM | POA: Diagnosis not present

## 2016-05-03 DIAGNOSIS — J449 Chronic obstructive pulmonary disease, unspecified: Secondary | ICD-10-CM | POA: Diagnosis not present

## 2016-05-08 ENCOUNTER — Ambulatory Visit
Admission: RE | Admit: 2016-05-08 | Discharge: 2016-05-08 | Disposition: A | Discharge status: Home / Self Care | Payer: Medicare Other | Source: Ambulatory Visit | Attending: Internal Medicine | Admitting: Internal Medicine

## 2016-05-08 DIAGNOSIS — Z1239 Encounter for other screening for malignant neoplasm of breast: Secondary | ICD-10-CM

## 2016-05-08 DIAGNOSIS — Z1231 Encounter for screening mammogram for malignant neoplasm of breast: Secondary | ICD-10-CM | POA: Diagnosis not present

## 2016-05-12 ENCOUNTER — Other Ambulatory Visit: Payer: Self-pay | Admitting: Internal Medicine

## 2016-05-12 DIAGNOSIS — R0602 Shortness of breath: Secondary | ICD-10-CM

## 2016-05-18 DIAGNOSIS — G4733 Obstructive sleep apnea (adult) (pediatric): Secondary | ICD-10-CM | POA: Diagnosis not present

## 2016-05-19 ENCOUNTER — Encounter: Payer: Medicare Other | Admitting: Internal Medicine

## 2016-05-27 ENCOUNTER — Other Ambulatory Visit: Payer: Self-pay | Admitting: Internal Medicine

## 2016-05-28 NOTE — Telephone Encounter (Signed)
Received refill request from pt's pharmacy-furosemide is no longer on pt's medication list.  Will forward to pcp for review.  Please advise.Despina Hidden Cassady4/26/20188:48 AM

## 2016-05-28 NOTE — Telephone Encounter (Signed)
Needs to hold Lasix as Cr elevated at last visit. We will discuss at her appointment on 5/8. Thanks!

## 2016-06-02 DIAGNOSIS — J449 Chronic obstructive pulmonary disease, unspecified: Secondary | ICD-10-CM | POA: Diagnosis not present

## 2016-06-08 ENCOUNTER — Other Ambulatory Visit: Payer: Self-pay | Admitting: Internal Medicine

## 2016-06-08 DIAGNOSIS — G4730 Sleep apnea, unspecified: Secondary | ICD-10-CM

## 2016-06-09 ENCOUNTER — Encounter: Payer: Medicare Other | Admitting: Internal Medicine

## 2016-06-17 DIAGNOSIS — G4733 Obstructive sleep apnea (adult) (pediatric): Secondary | ICD-10-CM | POA: Diagnosis not present

## 2016-06-23 DIAGNOSIS — G4733 Obstructive sleep apnea (adult) (pediatric): Secondary | ICD-10-CM | POA: Diagnosis not present

## 2016-07-03 DIAGNOSIS — J449 Chronic obstructive pulmonary disease, unspecified: Secondary | ICD-10-CM | POA: Diagnosis not present

## 2016-07-14 ENCOUNTER — Encounter: Payer: Self-pay | Admitting: *Deleted

## 2016-08-02 DIAGNOSIS — J449 Chronic obstructive pulmonary disease, unspecified: Secondary | ICD-10-CM | POA: Diagnosis not present

## 2016-09-02 DIAGNOSIS — J449 Chronic obstructive pulmonary disease, unspecified: Secondary | ICD-10-CM | POA: Diagnosis not present

## 2016-09-14 ENCOUNTER — Other Ambulatory Visit: Payer: Self-pay | Admitting: *Deleted

## 2016-09-14 DIAGNOSIS — I10 Essential (primary) hypertension: Principal | ICD-10-CM

## 2016-09-14 DIAGNOSIS — E1159 Type 2 diabetes mellitus with other circulatory complications: Secondary | ICD-10-CM

## 2016-09-14 MED ORDER — ATENOLOL 50 MG PO TABS: 50.0000 mg | ORAL_TABLET | Freq: Two times a day (BID) | ORAL | 1 refills | Status: DC

## 2016-09-17 ENCOUNTER — Ambulatory Visit (INDEPENDENT_AMBULATORY_CARE_PROVIDER_SITE_OTHER): Payer: Medicare Other | Admitting: Internal Medicine

## 2016-09-17 VITALS — BP 121/53 | HR 60 | Temp 98.2°F | Ht 65.0 in | Wt 234.8 lb

## 2016-09-17 DIAGNOSIS — I129 Hypertensive chronic kidney disease with stage 1 through stage 4 chronic kidney disease, or unspecified chronic kidney disease: Secondary | ICD-10-CM

## 2016-09-17 DIAGNOSIS — E1159 Type 2 diabetes mellitus with other circulatory complications: Secondary | ICD-10-CM

## 2016-09-17 DIAGNOSIS — E1142 Type 2 diabetes mellitus with diabetic polyneuropathy: Secondary | ICD-10-CM

## 2016-09-17 DIAGNOSIS — N183 Chronic kidney disease, stage 3 (moderate): Secondary | ICD-10-CM

## 2016-09-17 DIAGNOSIS — M25562 Pain in left knee: Secondary | ICD-10-CM | POA: Diagnosis not present

## 2016-09-17 DIAGNOSIS — E1122 Type 2 diabetes mellitus with diabetic chronic kidney disease: Secondary | ICD-10-CM | POA: Diagnosis not present

## 2016-09-17 DIAGNOSIS — D631 Anemia in chronic kidney disease: Secondary | ICD-10-CM

## 2016-09-17 DIAGNOSIS — Z9989 Dependence on other enabling machines and devices: Secondary | ICD-10-CM | POA: Diagnosis not present

## 2016-09-17 DIAGNOSIS — Z87891 Personal history of nicotine dependence: Secondary | ICD-10-CM | POA: Diagnosis not present

## 2016-09-17 DIAGNOSIS — M25561 Pain in right knee: Secondary | ICD-10-CM | POA: Diagnosis not present

## 2016-09-17 DIAGNOSIS — G4730 Sleep apnea, unspecified: Secondary | ICD-10-CM

## 2016-09-17 DIAGNOSIS — Z794 Long term (current) use of insulin: Secondary | ICD-10-CM

## 2016-09-17 DIAGNOSIS — N189 Chronic kidney disease, unspecified: Secondary | ICD-10-CM

## 2016-09-17 DIAGNOSIS — G4733 Obstructive sleep apnea (adult) (pediatric): Secondary | ICD-10-CM | POA: Diagnosis not present

## 2016-09-17 DIAGNOSIS — G8929 Other chronic pain: Secondary | ICD-10-CM

## 2016-09-17 DIAGNOSIS — I10 Essential (primary) hypertension: Secondary | ICD-10-CM

## 2016-09-17 DIAGNOSIS — Z79899 Other long term (current) drug therapy: Secondary | ICD-10-CM

## 2016-09-17 LAB — GLUCOSE, CAPILLARY: GLUCOSE-CAPILLARY: 181 mg/dL — AB (ref 65–99)

## 2016-09-17 LAB — POCT GLYCOSYLATED HEMOGLOBIN (HGB A1C): Hemoglobin A1C: 7

## 2016-09-17 MED ORDER — INSULIN PEN NEEDLE 32G X 6 MM MISC: 1 refills | Status: DC

## 2016-09-17 NOTE — Patient Instructions (Addendum)
Thank you for visiting clinic today. We are checking some blood workup today, we might have to make some changes to your medications according to those results. We will call you once those results become available. Continue to take all of your medications at this time. If I have to make some changes, we will ask you to follow-up in our clinic within a    month. Otherwise you can follow-up in 3 months with your PCP. Please bring your glucometer meter with you during next follow-up visit.

## 2016-09-17 NOTE — Progress Notes (Signed)
   CC: For follow-up of her diabetes, hypertension and chronic pain.  HPI:  [name] is a 71 y.o. elderly lady with past medical history as listed below came to the clinic for follow-up of her chronic problems which include diabetes, hypertension, sleep apnea and chronic pain.   She was complaining of bilateral knee pain. She states that since she got her new CPAP machine, she has no problem with that. According to patient she is sleeping well and denies any other symptoms.  Please see assessment and plan to address her chronic issues.  Past Medical History:  Diagnosis Date  . Anemia, iron deficiency   . 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 Eston Esters.  . Chronic renal insufficiency   . COPD (chronic obstructive pulmonary disease) ([place])   . Dental caries   . Diabetes mellitus   . Diabetic peripheral neuropathy ([place])   . Gastritis and duodenitis 1996   H. pylori positive  . Hyperlipidemia   . Hypertension   . Low back pain   . OSA (obstructive sleep apnea)    Sleep study 12/23/2003 showed very severe obstructive sleep apnea/hypopnea syndrome, RDI 136 per hour, with severe oxygen desaturation to 50% on room air before CPAP.  Marland Kitchen Uterine leiomyoma    Hx of massive leiomyomata uteri; S/P total abdominal hysterectomy, right salpingo-oophorectomy, and debulking of benign fibroid from vesicouterine space 11/18/99   . Visual impairment    Review of Systems:  As per HPI.  Physical Exam:  Vitals:   09/17/16 1052  BP: (!) 121/53  Pulse: 60  Temp: 98.2 F (36.8 C)  TempSrc: Oral  SpO2: 97%  Weight: 234 lb 12.8 oz (106.5 kg)  Height: 5\' 5"  (1.651 m)   Vitals:   09/17/16 1052  BP: (!) 121/53  Pulse: 60  Temp: 98.2 F (36.8 C)  TempSrc: Oral  SpO2: 97%  Weight: 234 lb 12.8 oz (106.5 kg)  Height: 5\' 5"  (1.651 m)   General: Vital signs  reviewed.  Patient is well-developed and well-nourished, in no acute distress and cooperative with exam.  Head: Normocephalic and atraumatic. Eyes: EOMI, conjunctivae normal, no scleral icterus.  Cardiovascular: RRR, S1 normal, S2 normal, no murmurs, gallops, or rubs. Pulmonary/Chest: Clear to auscultation bilaterally, no wheezes, rales, or rhonchi. Abdominal: Soft, non-tender, non-distended, BS +, no masses, organomegaly, or guarding present.  Extremities: 1+ edema up to mid legs,  pulses symmetric and intact bilaterally. No cyanosis or clubbing. Skin: Warm, dry and intact. No rashes or erythema. Psychiatric: Normal mood and affect. speech and behavior is normal. Cognition and memory are normal.  Assessment & Plan:   See Encounters Tab for problem based charting.  Patient discussed with Dr. Daryll Drown.

## 2016-09-18 ENCOUNTER — Telehealth: Payer: Self-pay | Admitting: Internal Medicine

## 2016-09-18 ENCOUNTER — Encounter (HOSPITAL_COMMUNITY): Payer: Self-pay | Admitting: Emergency Medicine

## 2016-09-18 ENCOUNTER — Emergency Department (HOSPITAL_COMMUNITY): Payer: Medicare Other

## 2016-09-18 ENCOUNTER — Observation Stay (HOSPITAL_COMMUNITY)
Admission: EM | Admit: 2016-09-18 | Discharge: 2016-09-19 | Disposition: A | Discharge status: Home / Self Care | Payer: Medicare Other | Attending: Internal Medicine | Admitting: Internal Medicine

## 2016-09-18 ENCOUNTER — Other Ambulatory Visit: Payer: Self-pay

## 2016-09-18 DIAGNOSIS — N185 Chronic kidney disease, stage 5: Secondary | ICD-10-CM

## 2016-09-18 DIAGNOSIS — M25562 Pain in left knee: Secondary | ICD-10-CM | POA: Diagnosis not present

## 2016-09-18 DIAGNOSIS — Z794 Long term (current) use of insulin: Secondary | ICD-10-CM | POA: Diagnosis not present

## 2016-09-18 DIAGNOSIS — Z87891 Personal history of nicotine dependence: Secondary | ICD-10-CM | POA: Insufficient documentation

## 2016-09-18 DIAGNOSIS — I120 Hypertensive chronic kidney disease with stage 5 chronic kidney disease or end stage renal disease: Secondary | ICD-10-CM | POA: Diagnosis not present

## 2016-09-18 DIAGNOSIS — E785 Hyperlipidemia, unspecified: Secondary | ICD-10-CM | POA: Insufficient documentation

## 2016-09-18 DIAGNOSIS — E559 Vitamin D deficiency, unspecified: Secondary | ICD-10-CM | POA: Diagnosis present

## 2016-09-18 DIAGNOSIS — G4733 Obstructive sleep apnea (adult) (pediatric): Secondary | ICD-10-CM | POA: Diagnosis not present

## 2016-09-18 DIAGNOSIS — C50919 Malignant neoplasm of unspecified site of unspecified female breast: Secondary | ICD-10-CM | POA: Insufficient documentation

## 2016-09-18 DIAGNOSIS — E875 Hyperkalemia: Principal | ICD-10-CM | POA: Diagnosis present

## 2016-09-18 DIAGNOSIS — D631 Anemia in chronic kidney disease: Secondary | ICD-10-CM | POA: Diagnosis not present

## 2016-09-18 DIAGNOSIS — I509 Heart failure, unspecified: Secondary | ICD-10-CM | POA: Diagnosis not present

## 2016-09-18 DIAGNOSIS — N2581 Secondary hyperparathyroidism of renal origin: Secondary | ICD-10-CM | POA: Insufficient documentation

## 2016-09-18 DIAGNOSIS — Z853 Personal history of malignant neoplasm of breast: Secondary | ICD-10-CM | POA: Diagnosis not present

## 2016-09-18 DIAGNOSIS — G8929 Other chronic pain: Secondary | ICD-10-CM | POA: Insufficient documentation

## 2016-09-18 DIAGNOSIS — N189 Chronic kidney disease, unspecified: Secondary | ICD-10-CM | POA: Diagnosis present

## 2016-09-18 DIAGNOSIS — M545 Low back pain: Secondary | ICD-10-CM | POA: Diagnosis not present

## 2016-09-18 DIAGNOSIS — I10 Essential (primary) hypertension: Secondary | ICD-10-CM

## 2016-09-18 DIAGNOSIS — M25561 Pain in right knee: Secondary | ICD-10-CM | POA: Diagnosis not present

## 2016-09-18 DIAGNOSIS — N186 End stage renal disease: Secondary | ICD-10-CM | POA: Diagnosis not present

## 2016-09-18 DIAGNOSIS — J449 Chronic obstructive pulmonary disease, unspecified: Secondary | ICD-10-CM | POA: Diagnosis not present

## 2016-09-18 DIAGNOSIS — I152 Hypertension secondary to endocrine disorders: Secondary | ICD-10-CM | POA: Diagnosis present

## 2016-09-18 DIAGNOSIS — E1122 Type 2 diabetes mellitus with diabetic chronic kidney disease: Secondary | ICD-10-CM | POA: Diagnosis not present

## 2016-09-18 DIAGNOSIS — G4730 Sleep apnea, unspecified: Secondary | ICD-10-CM | POA: Diagnosis present

## 2016-09-18 DIAGNOSIS — D509 Iron deficiency anemia, unspecified: Secondary | ICD-10-CM | POA: Diagnosis not present

## 2016-09-18 DIAGNOSIS — I110 Hypertensive heart disease with heart failure: Secondary | ICD-10-CM | POA: Diagnosis not present

## 2016-09-18 DIAGNOSIS — E1142 Type 2 diabetes mellitus with diabetic polyneuropathy: Secondary | ICD-10-CM | POA: Diagnosis not present

## 2016-09-18 DIAGNOSIS — Z7982 Long term (current) use of aspirin: Secondary | ICD-10-CM | POA: Insufficient documentation

## 2016-09-18 DIAGNOSIS — R0600 Dyspnea, unspecified: Secondary | ICD-10-CM | POA: Diagnosis not present

## 2016-09-18 DIAGNOSIS — E1159 Type 2 diabetes mellitus with other circulatory complications: Secondary | ICD-10-CM | POA: Diagnosis present

## 2016-09-18 DIAGNOSIS — Z79899 Other long term (current) drug therapy: Secondary | ICD-10-CM | POA: Insufficient documentation

## 2016-09-18 DIAGNOSIS — N179 Acute kidney failure, unspecified: Secondary | ICD-10-CM

## 2016-09-18 DIAGNOSIS — E1169 Type 2 diabetes mellitus with other specified complication: Secondary | ICD-10-CM | POA: Diagnosis present

## 2016-09-18 LAB — CBC
HEMATOCRIT: 24.7 % — AB (ref 34.0–46.6)
HEMATOCRIT: 26.5 % — AB (ref 36.0–46.0)
HEMOGLOBIN: 7.4 g/dL — AB (ref 11.1–15.9)
HEMOGLOBIN: 7.9 g/dL — AB (ref 12.0–15.0)
MCH: 22.9 pg — ABNORMAL LOW (ref 26.6–33.0)
MCH: 23.2 pg — AB (ref 26.0–34.0)
MCHC: 30 g/dL — AB (ref 31.5–35.7)
MCHC: 29.8 g/dL — ABNORMAL LOW (ref 30.0–36.0)
MCV: 77 fL — ABNORMAL LOW (ref 79–97)
MCV: 77.7 fL — AB (ref 78.0–100.0)
Platelets: 226 10*3/uL (ref 150–379)
Platelets: 201 10*3/uL (ref 150–400)
RBC: 3.23 x10E6/uL — AB (ref 3.77–5.28)
RBC: 3.41 MIL/uL — AB (ref 3.87–5.11)
RDW: 15.6 % — ABNORMAL HIGH (ref 12.3–15.4)
RDW: 15.3 % (ref 11.5–15.5)
WBC: 8 10*3/uL (ref 3.4–10.8)
WBC: 8.7 10*3/uL (ref 4.0–10.5)

## 2016-09-18 LAB — FERRITIN: Ferritin: 939 ng/mL — ABNORMAL HIGH (ref 15–150)

## 2016-09-18 LAB — BASIC METABOLIC PANEL
ANION GAP: 7 (ref 5–15)
BUN: 61 mg/dL — ABNORMAL HIGH (ref 6–20)
CHLORIDE: 113 mmol/L — AB (ref 101–111)
CO2: 19 mmol/L — AB (ref 22–32)
Calcium: 9.9 mg/dL (ref 8.9–10.3)
Creatinine, Ser: 4.19 mg/dL — ABNORMAL HIGH (ref 0.44–1.00)
GFR calc non Af Amer: 10 mL/min — ABNORMAL LOW (ref >60)
GFR, EST AFRICAN AMERICAN: 11 mL/min — AB (ref >60)
Glucose, Bld: 193 mg/dL — ABNORMAL HIGH (ref 65–99)
POTASSIUM: 5.9 mmol/L — AB (ref 3.5–5.1)
Sodium: 139 mmol/L (ref 135–145)

## 2016-09-18 LAB — BMP8+ANION GAP
ANION GAP: 14 mmol/L (ref 10.0–18.0)
BUN/Creatinine Ratio: 14 (ref 12–28)
BUN: 56 mg/dL — AB (ref 8–27)
CALCIUM: 9.4 mg/dL (ref 8.7–10.3)
CHLORIDE: 114 mmol/L — AB (ref 96–106)
CO2: 15 mmol/L — ABNORMAL LOW (ref 20–29)
Creatinine, Ser: 4.08 mg/dL — ABNORMAL HIGH (ref 0.57–1.00)
GFR calc Af Amer: 12 mL/min/{1.73_m2} — ABNORMAL LOW (ref >59)
GFR calc non Af Amer: 10 mL/min/{1.73_m2} — ABNORMAL LOW (ref >59)
GLUCOSE: 201 mg/dL — AB (ref 65–99)
POTASSIUM: 6.7 mmol/L — AB (ref 3.5–5.2)
Sodium: 143 mmol/L (ref 134–144)

## 2016-09-18 LAB — IRON AND TIBC
IRON SATURATION: 29 % (ref 15–55)
IRON: 57 ug/dL (ref 27–139)
TIBC: 196 ug/dL — AB (ref 250–450)
UIBC: 139 ug/dL (ref 118–369)

## 2016-09-18 LAB — GLUCOSE, CAPILLARY
GLUCOSE-CAPILLARY: 150 mg/dL — AB (ref 65–99)
GLUCOSE-CAPILLARY: 194 mg/dL — AB (ref 65–99)

## 2016-09-18 LAB — I-STAT TROPONIN, ED: Troponin i, poc: 0.02 ng/mL (ref 0.00–0.08)

## 2016-09-18 LAB — BRAIN NATRIURETIC PEPTIDE: B Natriuretic Peptide: 118.5 pg/mL — ABNORMAL HIGH (ref 0.0–100.0)

## 2016-09-18 LAB — CBG MONITORING, ED: GLUCOSE-CAPILLARY: 156 mg/dL — AB (ref 65–99)

## 2016-09-18 MED ORDER — SIMVASTATIN 20 MG PO TABS
20.0000 mg | ORAL_TABLET | Freq: Every day | ORAL | Status: DC
  Administered 2016-09-18: 20 mg via ORAL
  Filled 2016-09-18: qty 1

## 2016-09-18 MED ORDER — ASPIRIN EC 81 MG PO TBEC
81.0000 mg | DELAYED_RELEASE_TABLET | Freq: Every day | ORAL | Status: DC
  Administered 2016-09-18 – 2016-09-19 (×2): 81 mg via ORAL
  Filled 2016-09-18 (×2): qty 1

## 2016-09-18 MED ORDER — DARBEPOETIN ALFA 100 MCG/0.5ML IJ SOSY
100.0000 ug | PREFILLED_SYRINGE | INTRAMUSCULAR | Status: DC
  Administered 2016-09-18: 100 ug via SUBCUTANEOUS
  Filled 2016-09-18: qty 0.5

## 2016-09-18 MED ORDER — AMLODIPINE BESYLATE 10 MG PO TABS
10.0000 mg | ORAL_TABLET | Freq: Every morning | ORAL | Status: DC
  Administered 2016-09-18 – 2016-09-19 (×2): 10 mg via ORAL
  Filled 2016-09-18: qty 1
  Filled 2016-09-18: qty 2

## 2016-09-18 MED ORDER — FUROSEMIDE 20 MG PO TABS
160.0000 mg | ORAL_TABLET | Freq: Once | ORAL | Status: AC
  Administered 2016-09-18: 160 mg via ORAL
  Filled 2016-09-18: qty 8

## 2016-09-18 MED ORDER — INSULIN ASPART 100 UNIT/ML ~~LOC~~ SOLN
10.0000 [IU] | Freq: Once | SUBCUTANEOUS | Status: AC
  Administered 2016-09-18: 10 [IU] via SUBCUTANEOUS

## 2016-09-18 MED ORDER — SODIUM CHLORIDE 0.9% FLUSH: 3.0000 mL | Freq: Two times a day (BID) | INTRAVENOUS | Status: DC

## 2016-09-18 MED ORDER — CLONIDINE HCL 0.2 MG PO TABS
0.3000 mg | ORAL_TABLET | Freq: Two times a day (BID) | ORAL | Status: DC
  Administered 2016-09-18 – 2016-09-19 (×3): 0.3 mg via ORAL
  Filled 2016-09-18 (×3): qty 1

## 2016-09-18 MED ORDER — FUROSEMIDE 20 MG PO TABS
160.0000 mg | ORAL_TABLET | Freq: Three times a day (TID) | ORAL | Status: DC
  Administered 2016-09-18: 160 mg via ORAL
  Filled 2016-09-18: qty 8

## 2016-09-18 MED ORDER — DEXTROSE 50 % IV SOLN: 1.0000 | Freq: Once | INTRAVENOUS | Status: DC

## 2016-09-18 MED ORDER — FUROSEMIDE 10 MG/ML IJ SOLN
40.0000 mg | INTRAMUSCULAR | Status: DC
  Filled 2016-09-18: qty 4

## 2016-09-18 MED ORDER — HEPARIN SODIUM (PORCINE) 5000 UNIT/ML IJ SOLN
5000.0000 [IU] | Freq: Three times a day (TID) | INTRAMUSCULAR | Status: DC
  Administered 2016-09-18 – 2016-09-19 (×2): 5000 [IU] via SUBCUTANEOUS
  Filled 2016-09-18 (×2): qty 1

## 2016-09-18 MED ORDER — FERROUS SULFATE 325 (65 FE) MG PO TABS
325.0000 mg | ORAL_TABLET | Freq: Every day | ORAL | Status: DC
  Administered 2016-09-19: 325 mg via ORAL
  Filled 2016-09-18: qty 1

## 2016-09-18 MED ORDER — SODIUM POLYSTYRENE SULFONATE 15 GM/60ML PO SUSP
30.0000 g | Freq: Once | ORAL | Status: AC
  Administered 2016-09-18: 30 g via ORAL
  Filled 2016-09-18: qty 120

## 2016-09-18 MED ORDER — SODIUM POLYSTYRENE SULFONATE 15 GM/60ML PO SUSP
30.0000 g | Freq: Once | ORAL | Status: AC
Start: 2016-09-18 — End: 2016-09-18
  Administered 2016-09-18: 30 g via ORAL
  Filled 2016-09-18: qty 120

## 2016-09-18 MED ORDER — IPRATROPIUM-ALBUTEROL 20-100 MCG/ACT IN AERS: 1.0000 | INHALATION_SPRAY | Freq: Four times a day (QID) | RESPIRATORY_TRACT | Status: DC

## 2016-09-18 MED ORDER — INSULIN ASPART 100 UNIT/ML ~~LOC~~ SOLN: 0.0000 [IU] | Freq: Every day | SUBCUTANEOUS | Status: DC

## 2016-09-18 MED ORDER — INSULIN GLARGINE 100 UNIT/ML ~~LOC~~ SOLN
30.0000 [IU] | Freq: Every day | SUBCUTANEOUS | Status: DC
  Administered 2016-09-18: 30 [IU] via SUBCUTANEOUS
  Filled 2016-09-18 (×2): qty 0.3

## 2016-09-18 MED ORDER — INSULIN ASPART 100 UNIT/ML ~~LOC~~ SOLN
0.0000 [IU] | Freq: Three times a day (TID) | SUBCUTANEOUS | Status: DC
  Administered 2016-09-18 – 2016-09-19 (×2): 1 [IU] via SUBCUTANEOUS

## 2016-09-18 MED ORDER — VITAMIN D3 25 MCG (1000 UT) PO CAPS: 1.0000 | ORAL_CAPSULE | Freq: Every day | ORAL | Status: DC

## 2016-09-18 MED ORDER — ATENOLOL 50 MG PO TABS
50.0000 mg | ORAL_TABLET | Freq: Two times a day (BID) | ORAL | Status: DC
  Administered 2016-09-18: 50 mg via ORAL
  Filled 2016-09-18: qty 1

## 2016-09-18 MED ORDER — PANTOPRAZOLE SODIUM 40 MG PO TBEC
40.0000 mg | DELAYED_RELEASE_TABLET | Freq: Every day | ORAL | Status: DC
  Administered 2016-09-18 – 2016-09-19 (×2): 40 mg via ORAL
  Filled 2016-09-18 (×2): qty 1

## 2016-09-18 MED ORDER — INSULIN ASPART 100 UNIT/ML IV SOLN: 10.0000 [IU] | Freq: Once | INTRAVENOUS | Status: DC

## 2016-09-18 NOTE — Assessment & Plan Note (Signed)
She continued to have bilateral knee pain. Worse with walking and relieved with resting.  -Patient was advised to use Tylenol if needed.

## 2016-09-18 NOTE — ED Notes (Signed)
Attempted PIV placement to R hand with unsuccessful placement.

## 2016-09-18 NOTE — Assessment & Plan Note (Addendum)
She was having stage III kidney disease on previous blood workup.  She was having mild lower extremity edema, stating that she had it for long time.  -Repeat BMP-it shows worsening of renal function with GFR of 10 now. She was also found to have potassium above 6.7. She did not had any chest pain or palpitations during office visit. She was advised by on call resident to go to ED for further management. Patient is in ED currently. -Enalapril should be discontinued. -She needs a referral for nephrology.

## 2016-09-18 NOTE — Assessment & Plan Note (Signed)
She has well-controlled diabetes with current A1c of 7.0. Previous A1c was 7.3. Patient changes her Lantus according to her blood sugar herself. During previous office visit she was instructed to use 45 units at bedtime. Currently she is using 50 units at bedtime. She denies any hypoglycemic event. According to patient whenever she checks her blood sugar at home it remains between 130 to 160. She never brought her glucometer meter with her during this appointment.  -She was advised to continue Lantus 50 units at bedtime. -She should bring her glucometer meter during next follow-up visit.

## 2016-09-18 NOTE — Consult Note (Signed)
Joan Banks is an 71 y.o. 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  She says she sees Dr Posey Pronto here in the hospital??  She had labs done yest and K was 6.7 and she was told to come to ER today.  K today 5.9.  She denies any uremic sxs except for fatigue.  She has significant anemia and is suppose to have procrit injections but has not been going.    Past Medical History:  Diagnosis Date  . Anemia, iron deficiency   . 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 Eston Esters.  . Chronic renal insufficiency   . COPD (chronic obstructive pulmonary disease) ([place])   . Dental caries   . Diabetes mellitus   . Diabetic peripheral neuropathy ([place])   . Gastritis and duodenitis 1996   H. pylori positive  . Hyperlipidemia   . Hypertension   . Low back pain   . OSA (obstructive sleep apnea)    Sleep study 12/23/2003 showed very severe obstructive sleep apnea/hypopnea syndrome, RDI 136 per hour, with severe oxygen desaturation to 50% on room air before CPAP.  Marland Kitchen Uterine leiomyoma    Hx of massive leiomyomata uteri; S/P total abdominal hysterectomy, right salpingo-oophorectomy, and debulking of benign fibroid from vesicouterine space 11/18/99   . Visual impairment     Past Surgical History:  Procedure Laterality Date  . BREAST BIOPSY  2003   Benign Core   . BREAST BIOPSY  1999   Stereo Core   . 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 Eston Esters.  . CONDYLOMA EXCISION/FULGURATION  11/18/1999   Perianal condylomas.  Marland Kitchen LIPOMA EXCISION  07/14/1998 & 04/14/2001   S/P excision of lipoma, right shoulder measured as 6 cm x 4 cm by  Dr. Kathrin Penner 07/14/1998.  Marland Kitchen TOTAL ABDOMINAL HYSTERECTOMY  11/18/1999   Hx of massive leiomyomata uteri; S/P total abdominal hysterectomy, right salpingo-oophorectomy, and debulking of benign fibroid from vesicouterine space 11/18/99.    Family History  Problem Relation Age of Onset  . Breast cancer Sister   . Cancer Mother        Marena Chancy of type.   Social History:  reports that she quit smoking about 16 years ago. Her smoking use included Cigarettes. She has a 15.00 pack-year smoking history. She has never used smokeless tobacco. She reports that she does not drink alcohol or use drugs.  Allergies: No Known Allergies   (Not in a hospital admission)   Lab Results: UA: ND  Recent Labs  09/17/16 1136 09/18/16 0856  WBC 8.0 8.7  HGB 7.4* 7.9*  HCT 24.7* 26.5*  PLT 226 201   BMET  Recent Labs  09/17/16 1136 09/18/16 0856  NA 143 139  K 6.7* 5.9*  CL 114* 113*  CO2 15* 19*  GLUCOSE 201* 193*  BUN 56* 61*  CREATININE 4.08* 4.19*  CALCIUM 9.4 9.9   LFT No results for input(s): PROT, ALBUMIN, AST, ALT, ALKPHOS, BILITOT, BILIDIR, IBILI in the last 72 hours. Dg Chest 2 View  Result Date: 09/18/2016 CLINICAL DATA:  Dyspnea on exertion. Chronic renal failure. Lower extremity edema.  EXAM: CHEST  2 VIEW COMPARISON:  06/30/2012 chest radiograph. FINDINGS: Stable cardiomediastinal silhouette with mild cardiomegaly and aortic atherosclerosis. No pneumothorax. No pleural effusion. Patchy right lung base opacity. Cephalization of the pulmonary vasculature with borderline mild pulmonary edema. IMPRESSION: 1. Cardiomegaly, cephalization of the pulmonary vasculature and borderline mild pulmonary edema, suggesting mild congestive heart failure. 2. Patchy right lung base opacity, which could represent atelectasis, aspiration or pneumonia. Recommend follow-up PA and lateral post treatment chest radiographs in 4-6 weeks. Electronically Signed   By: Ilona Sorrel M.D.   On: 09/18/2016 11:44     ROS: No vision Rt eye + DOE No CP No abd CO.  No melena or hematochezia No dysuria Mild numbness/tingling hands and feet  PHYSICAL EXAM: Blood pressure (!) 173/70, pulse (!) 58, temperature 98.4 F (36.9 C), temperature source Oral, resp. rate 18, height 5\' 5"  (1.651 m), weight 106.1 kg (234 lb), SpO2 100 %. HEENT: Rt eye opaque NECK:Mild JVD LUNGS:Clear CARDIAC:RRR 1/6 systolic M LSB. No rub ABD:+ BS NTND No HSM EXT:Tr edema NEURO:CNI except Rt II, Ox3 No asterixis  Assessment: 1. CKD 5 sec DM/HTN 2. Hyperkalemia due to CKD and ACE 3. Anemia sec CKD 4. HTN 5. DM PLAN: 1. ER unable to get IV access so will give PO lasix 2. She is not uremic and thus does not need urgent HD.  I discussed the inevitable need for HD in near future and the need to get a AVF/AVG place.  Will order vein mapping.  [facility] called. 3. Renal diet and dietitian to see to instruct on low K diet 4.  start aranesp 5. Daily labs 6. Check PTH 7. Give second dose of kayexalate 4hr after first dose  Alexis Mizuno T 09/18/2016, 4:07 PM

## 2016-09-18 NOTE — ED Notes (Signed)
Up to br

## 2016-09-18 NOTE — ED Triage Notes (Signed)
Pt states "my doctor told me to come here". Pt states she had routine lab work at her doctors office and they told her to come here because her potassium was very high. Pt states shes a diabetic, htn, "kidney problems". Pt is poor historian.

## 2016-09-18 NOTE — Telephone Encounter (Signed)
Patient was finally reached with lab results. Instructed her to come into the ED as soon as possible for evaluation. Patient reported she will call a friend for a ride.

## 2016-09-18 NOTE — Telephone Encounter (Addendum)
Paged by [facility] with critical lab result, potassium 6.7 (call back number [phone]). Attempted to call patient x 2 with no answer. Phone rang for several minutes without an answering machine. Will attempt to call again in the morning and instruct patient to present to the ER or return to clinic for evaluation.

## 2016-09-18 NOTE — H&P (Signed)
Date: 09/18/2016               Patient Name:  Joan Banks MRN: [id number]  DOB: Dec 24, 1945 Age / Sex: 71 y.o., female   PCP: Tawny Asal, MD              Medical Service: Internal Medicine Teaching Service              Attending Physician: Dr. Oval Linsey, MD    First Contact: Jory Ee, OMS-IV Pager: [phone]  Second Contact: Dr. Juleen China Pager: [phone]            After Hours (After 5p/  First Contact Pager: [phone]  weekends / holidays): Second Contact Pager: [phone]   Chief Complaint: Hyperkalemia  History of Present Illness: Joan Banks is a 71 yo African American Female with a medical history significant for CKD Stage 5, COPD, Diabetes, and Hypertension. She presented to the [facility] ED after being instructed by the outpatient Internal Medicine [facility] that her potassium was 6.7. Prior to presentation she reports that she was feeling fine. She does have occasional fluctuating chest discomfort. She still urinates and feels as if she urinates the same amount she has when she was younger.  She denies any nausea, vomiting, increased cough or shortness of breath, recent illness, or decreased appetite. Her PO intake has been adequate.   ED Course: Patient presented with vital signs that were consistent with hypertension. EKG revealed sinus rhythm and no waveform abnormalities. CXR was consistent with cardiomegaly and possible pulmonary mild edema. IMTS was contacted and admitted the patient to the general internal medicine telemetry floor.  Meds: Current Facility-Administered Medications  Medication Dose Route Frequency Provider Last Rate Last Dose  . amLODipine (NORVASC) tablet 10 mg  10 mg Oral q morning - 10a Jule Ser, DO      . aspirin EC tablet 81 mg  81 mg Oral Daily Jule Ser, DO      . atenolol (TENORMIN) tablet 50 mg  50 mg Oral BID Jule Ser, DO      . cloNIDine (CATAPRES) tablet 0.3 mg  0.3 mg Oral BID Jule Ser, DO        . [START ON 09/19/2016] ferrous sulfate tablet 325 mg  325 mg Oral Q breakfast Jule Ser, DO      . furosemide (LASIX) injection 40 mg  40 mg Intravenous STAT Little, Wenda Overland, MD      . heparin injection 5,000 Units  5,000 Units Subcutaneous Q8H Jule Ser, DO      . insulin aspart (novoLOG) injection 0-5 Units  0-5 Units Subcutaneous QHS Jule Ser, DO      . insulin aspart (novoLOG) injection 0-9 Units  0-9 Units Subcutaneous TID WC Jule Ser, DO      . insulin glargine (LANTUS) injection 30 Units  30 Units Subcutaneous QHS Jule Ser, DO      . Ipratropium-Albuterol (COMBIVENT) respimat 1 puff  1 puff Inhalation Q6H Jule Ser, DO      . pantoprazole (PROTONIX) EC tablet 40 mg  40 mg Oral Daily Jule Ser, DO      . simvastatin (ZOCOR) tablet 20 mg  20 mg Oral Daily Jule Ser, DO      . sodium chloride flush (NS) 0.9 % injection 3 mL  3 mL Intravenous Q12H Jule Ser, DO      . Vitamin D3 CAPS 1,000 Units  1 capsule Oral Daily Jule Ser, DO       Current  Outpatient Prescriptions  Medication Sig Dispense Refill  . amLODipine (NORVASC) 10 MG tablet take 1 tablet by mouth every morning (Patient taking differently: Take 10mg  by mouth every morning) 90 tablet 1  . aspirin 81 MG EC tablet Take 1 tablet (81 mg total) by mouth daily. 100 tablet 3  . atenolol (TENORMIN) 50 MG tablet Take 1 tablet (50 mg total) by mouth 2 (two) times daily. (Patient taking differently: Take 100 mg by mouth daily. ) 180 tablet 1  . cloNIDine (CATAPRES) 0.3 MG tablet Take 1 tablet (0.3 mg total) by mouth 3 (three) times daily. (Patient taking differently: Take 0.9 mg by mouth 2 (two) times daily. ) 90 tablet 5  . enalapril (VASOTEC) 20 MG tablet Take 40 mg by mouth daily.  0  . ferrous sulfate 325 (65 FE) MG tablet Take 1 tablet (325 mg total) by mouth 2 (two) times daily. (Patient taking differently: Take 325 mg by mouth daily with breakfast. ) 60 tablet 2  .  Insulin Glargine (LANTUS SOLOSTAR) 100 UNIT/ML Solostar Pen Inject 45 Units into the skin daily at 10 pm. INJECT 30 UNITS INTO SKIN AT BEDTIME (Patient taking differently: Inject 50 Units into the skin daily at 10 pm. ) 15 mL 5  . mometasone (NASONEX) 50 MCG/ACT nasal spray instill 2 sprays into each nostril once daily 17 g 0  . omeprazole (PRILOSEC) 20 MG capsule take 2 capsules by mouth daily 60 capsule 2  . simvastatin (ZOCOR) 20 MG tablet Take 1 tablet (20 mg total) by mouth daily. 90 tablet 3  . sitaGLIPtin (JANUVIA) 50 MG tablet Take 1 tablet (50 mg total) by mouth daily. 90 tablet 3  . Cholecalciferol (VITAMIN D3) 1000 units CAPS Take 1 capsule (1,000 Units total) by mouth daily. (Patient not taking: Reported on 09/18/2016) 30 capsule 6  . Ipratropium-Albuterol (COMBIVENT) 20-100 MCG/ACT AERS respimat Inhale 1 puff into the lungs every 6 (six) hours. (Patient not taking: Reported on 09/18/2016) 1 Inhaler 5    Allergies: Allergies as of 09/18/2016  . (No Known Allergies)   Past Medical History:  Diagnosis Date  . Anemia, iron deficiency   . 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 Eston Esters.  . Chronic renal insufficiency   . COPD (chronic obstructive pulmonary disease) ([place])   . Dental caries   . Diabetes mellitus   . Diabetic peripheral neuropathy ([place])   . Gastritis and duodenitis 1996   H. pylori positive  . Hyperlipidemia   . Hypertension   . Low back pain   . OSA (obstructive sleep apnea)    Sleep study 12/23/2003 showed very severe obstructive sleep apnea/hypopnea syndrome, RDI 136 per hour, with severe oxygen desaturation to 50% on room air before CPAP.  Marland Kitchen Uterine leiomyoma    Hx of massive leiomyomata uteri; S/P total abdominal hysterectomy, right salpingo-oophorectomy, and debulking of benign fibroid from vesicouterine space 11/18/99   . Visual  impairment    Past Surgical History:  Procedure Laterality Date  . BREAST BIOPSY  2003   Benign Core   . BREAST BIOPSY  1999   Stereo Core   . 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 Eston Esters.  . CONDYLOMA EXCISION/FULGURATION  11/18/1999   Perianal condylomas.  Marland Kitchen  LIPOMA EXCISION  07/14/1998 & 04/14/2001   S/P excision of lipoma, right shoulder measured as 6 cm x 4 cm by Dr. Kathrin Penner 07/14/1998.  Marland Kitchen TOTAL ABDOMINAL HYSTERECTOMY  11/18/1999   Hx of massive leiomyomata uteri; S/P total abdominal hysterectomy, right salpingo-oophorectomy, and debulking of benign fibroid from vesicouterine space 11/18/99.   Family History  Problem Relation Age of Onset  . Breast cancer Sister   . Cancer Mother        Marena Chancy of type.   Social History   Social History  . Marital status: Married    Spouse name: N/A  . Number of children: N/A  . Years of education: N/A   Occupational History  . Not on file.   Social History Main Topics  . Smoking status: Former Smoker    Packs/day: 0.50    Years: 30.00    Types: Cigarettes    Quit date: 02/03/2000  . Smokeless tobacco: Never Used  . Alcohol use No  . Drug use: No  . Sexual activity: Not on file   Other Topics Concern  . Not on file   Social History Narrative  . No narrative on file    Review of Systems: Pertinent items noted in HPI and remainder of comprehensive ROS otherwise negative.  Physical Exam: Blood pressure (!) 144/119, pulse 79, temperature 98.4 F (36.9 C), temperature source Oral, resp. rate 20, height 5\' 5"  (1.651 m), weight 106.1 kg (234 lb), SpO2 100 %. BP (!) 163/63 (BP Location: Right Arm)   Pulse 66   Temp 98.4 F (36.9 C) (Oral)   Resp 18   Ht 5\' 5"  (1.651 m)   Wt 106.1 kg (234 lb)   SpO2 97%   BMI 38.94 kg/m  General appearance: alert, cooperative and no distress Head:  Normocephalic, without obvious abnormality, atraumatic Lungs: decreased air movement bilaterally Heart: regular rate and rhythm, S1, S2 normal, no murmur, click, rub or gallop Abdomen: soft, non-tender; bowel sounds normal; no masses,  no organomegaly Extremities: 1+ pitting edema present bilaterally in the lower exermities Pulses: 2+ and symmetric Skin: Skin color, texture, turgor normal. No rashes or lesions Neurologic: Grossly normal  Lab results: Na 139, K 5.9 (from 6.7), Cl 113, CO2 19, BUN 61, Cr 4.2, Gluc 193, GFR 10, Anion gap 7 WBC 8.7, Hgb 7.9, HCT 26, Plt 201 BNP 118 I stat troponin 0.02  Imaging results:  Dg Chest 2 View  Result Date: 09/18/2016 CLINICAL DATA:  Dyspnea on exertion. Chronic renal failure. Lower extremity edema. EXAM: CHEST  2 VIEW COMPARISON:  06/30/2012 chest radiograph. FINDINGS: Stable cardiomediastinal silhouette with mild cardiomegaly and aortic atherosclerosis. No pneumothorax. No pleural effusion. Patchy right lung base opacity. Cephalization of the pulmonary vasculature with borderline mild pulmonary edema. IMPRESSION: 1. Cardiomegaly, cephalization of the pulmonary vasculature and borderline mild pulmonary edema, suggesting mild congestive heart failure. 2. Patchy right lung base opacity, which could represent atelectasis, aspiration or pneumonia. Recommend follow-up PA and lateral post treatment chest radiographs in 4-6 weeks. Electronically Signed   By: Ilona Sorrel M.D.   On: 09/18/2016 11:44    Other results: EKG: normal EKG, normal sinus rhythm.  Assessment & Plan by Problem: Hyperkalemia    Patients potassium upon admission was 6.7 from outpatient labs on 8/16. Repeat labs in ED revealed potassium of 5.9. EKG was unremarkable for peaked T waves. Patient given 30mg  of Kayexlate x 2, 10u of IV insulin and 1amp D50, Lasix 160mg  PO.  Patient on telemetry. Monitoring renal  function daily, repreat EKG in am.   Acute on Chronic Renal Failure Patient  has a GFR of 10 which is down from her GFR of 15 in February 2018. Her creatinine is 4.19 and BUN is 61.  She is not overtly uremic other than some fatigue.  Hyperkalemia being manageed medically.  Nephrology has been consulted as well as Vascular surgery in anticipation for needing to start HD in the near future.  Vein mapping ordered to assess for permanent access planning. Holding all nephrotoxic medications ( enalapril 20mg ). Monitoring renal function daily. Check vitamin D and PTH   Excess Volume due to Possible Congestive Heart Failure Vs Renal Disease Patient with mild pitting edema and chest x-ray finding consistent with excess volume. Her BNP was 118 today. Ordered an echocardiogram to assess cardiac function.  Lasix 160mg  TID ordered.  Diabetes   Holding home oral diabetic medications. Patient on 50units Lantus at home.  Have ordered 30u of lantus and sliding scale insulin.  Follow blood sugars  Hypertension   Home meds are amlodipine 10mg  daily, atenolol 100mg  daily, clonidine 0.9mg  BID, enalapril 40mg  daily. Patient to continue amlodipine and clonidine for now.  Lasix as above.  Microcytic Anemia  Recent iron panel consistent with anemia of chronic disease due to high ferritin and low TIBC. Most likely secondary to chronic renal disease. Will consider transfusion if Hgb falls below 7. Aranesp started.  This is a Careers information officer Note.  The care of the patient was discussed with Dr.Arnetta Odeh and the assessment and plan was formulated with their assistance.  Please see their note for official documentation of the patient encounter.   Signed: Dwaine Deter, Medical Student 09/18/2016, 2:36 PM   Attestation for Student Documentation:  I personally was present and performed or re-performed the history, physical exam and medical decision-making activities of this service and have verified that the service and findings are accurately documented in the student's note.  Jule Ser,  DO 09/18/2016, 5:32 PM

## 2016-09-18 NOTE — ED Notes (Signed)
Report given to nicole on 5w

## 2016-09-18 NOTE — Consult Note (Signed)
[facility]  Patient name: Joan Banks MRN: [id number] DOB: Mar 07, 1945 Sex: female  REASON FOR CONSULT: permanent dialysis access, consult is from Dr. Mercy Moore  HPI:   Joan Banks is a 71 y.o. 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 She is not yet on hemodialysis. The patient is right-handed. She's never required temporary dialysis. She's never had access procedures before.  The patient presented to the hospital today secondary to abnormal labs including a potassium of 6.7. Shortness of breath. She does note some swelling in her legs. She also complains of some fatigue. Has seen Dr. Posey Pronto in the past but has not seen him in a while.  Denies history of cardiac issues. Does report occasional chest pain. Reports history of congestive heart failure. Diabetes is managed on insulin and oral hypoglycemics. Her hypertension is managed on multiple agents. Denies history of CVA.  Is a former smoker. Lives at home with her husband.  Past Medical History:  Diagnosis Date  . Anemia, iron deficiency   . 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 Eston Esters.  . Chronic renal insufficiency   . COPD (chronic obstructive pulmonary disease) ([place])   . Dental caries   . Diabetes mellitus   . Diabetic peripheral neuropathy ([place])   . Gastritis and duodenitis 1996   H. pylori positive  . Hyperlipidemia   . Hypertension   . Low back pain   . OSA (obstructive sleep apnea)    Sleep study 12/23/2003 showed very severe obstructive sleep apnea/hypopnea syndrome, RDI 136 per hour, with severe oxygen desaturation to 50% on room air before CPAP.  Marland Kitchen Uterine leiomyoma    Hx of massive leiomyomata uteri; S/P total abdominal hysterectomy, right salpingo-oophorectomy,  and debulking of benign fibroid from vesicouterine space 11/18/99   . Visual impairment     Family History  Problem Relation Age of Onset  . Breast cancer Sister   . Cancer Mother        Marena Chancy of type.     SOCIAL HISTORY:   Social History   Social History  . Marital status: Married    Spouse name: N/A  . Number of children: N/A  . Years of education: N/A   Occupational History  . Not on file.   Social History Main Topics  . Smoking status: Former Smoker    Packs/day: 0.50    Years: 30.00    Types: Cigarettes    Quit date: 02/03/2000  . Smokeless tobacco: Never Used  . Alcohol use No  . Drug use: No  . Sexual activity: Not on file   Other Topics Concern  . Not on file   Social History Narrative  . No narrative on file    No Known Allergies  MEDICATIONS:    Current Facility-Administered Medications  Medication Dose Route Frequency Provider Last Rate Last Dose  . amLODipine (NORVASC) tablet 10 mg  10 mg Oral q morning - 10a Jule Ser, DO   10 mg at 09/18/16 1448  . aspirin EC tablet 81 mg  81 mg Oral Daily Jule Ser, DO   81 mg at 09/18/16 1448  . cloNIDine (CATAPRES) tablet 0.3 mg  0.3 mg Oral BID Jule Ser, DO   0.3 mg at 09/18/16 1531  . Darbepoetin Alfa (ARANESP)  injection 100 mcg  100 mcg Subcutaneous Q Fri-1800 Fleet Contras, MD      . dextrose 50 % solution 50 mL  1 ampule Intravenous Once Tawny Asal, MD      . Derrill Memo ON 09/19/2016] ferrous sulfate tablet 325 mg  325 mg Oral Q breakfast Jule Ser, DO      . furosemide (LASIX) tablet 160 mg  160 mg Oral Once Little, Wenda Overland, MD      . furosemide (LASIX) tablet 160 mg  160 mg Oral TID Fleet Contras, MD      . heparin injection 5,000 Units  5,000 Units Subcutaneous Q8H Jule Ser, DO      . insulin aspart (novoLOG) injection 0-5 Units  0-5 Units Subcutaneous QHS Jule Ser, DO      . insulin aspart (novoLOG) injection 0-9 Units  0-9 Units Subcutaneous TID  WC Jule Ser, DO      . insulin aspart (novoLOG) injection 10 Units  10 Units Subcutaneous Once Oval Linsey, MD      . insulin glargine (LANTUS) injection 30 Units  30 Units Subcutaneous QHS Jule Ser, DO      . pantoprazole (PROTONIX) EC tablet 40 mg  40 mg Oral Daily Jule Ser, DO   40 mg at 09/18/16 1448  . simvastatin (ZOCOR) tablet 20 mg  20 mg Oral Daily Jule Ser, DO      . sodium chloride flush (NS) 0.9 % injection 3 mL  3 mL Intravenous Q12H Jule Ser, DO      . sodium polystyrene (KAYEXALATE) 15 GM/60ML suspension 30 g  30 g Oral Once Fleet Contras, MD      . Vitamin D3 CAPS 1,000 Units  1 capsule Oral Daily Jule Ser, DO       Current Outpatient Prescriptions  Medication Sig Dispense Refill  . amLODipine (NORVASC) 10 MG tablet take 1 tablet by mouth every morning (Patient taking differently: Take 10mg  by mouth every morning) 90 tablet 1  . aspirin 81 MG EC tablet Take 1 tablet (81 mg total) by mouth daily. 100 tablet 3  . atenolol (TENORMIN) 50 MG tablet Take 1 tablet (50 mg total) by mouth 2 (two) times daily. (Patient taking differently: Take 100 mg by mouth daily. ) 180 tablet 1  . cloNIDine (CATAPRES) 0.3 MG tablet Take 1 tablet (0.3 mg total) by mouth 3 (three) times daily. (Patient taking differently: Take 0.9 mg by mouth 2 (two) times daily. ) 90 tablet 5  . enalapril (VASOTEC) 20 MG tablet Take 40 mg by mouth daily.  0  . ferrous sulfate 325 (65 FE) MG tablet Take 1 tablet (325 mg total) by mouth 2 (two) times daily. (Patient taking differently: Take 325 mg by mouth daily with breakfast. ) 60 tablet 2  . Insulin Glargine (LANTUS SOLOSTAR) 100 UNIT/ML Solostar Pen Inject 45 Units into the skin daily at 10 pm. INJECT 30 UNITS INTO SKIN AT BEDTIME (Patient taking differently: Inject 50 Units into the skin daily at 10 pm. ) 15 mL 5  . mometasone (NASONEX) 50 MCG/ACT nasal spray instill 2 sprays into each nostril once daily 17 g 0  .  omeprazole (PRILOSEC) 20 MG capsule take 2 capsules by mouth daily 60 capsule 2  . simvastatin (ZOCOR) 20 MG tablet Take 1 tablet (20 mg total) by mouth daily. 90 tablet 3  . sitaGLIPtin (JANUVIA) 50 MG tablet Take 1 tablet (50 mg total) by mouth daily. 90 tablet 3  . Cholecalciferol (VITAMIN D3)  1000 units CAPS Take 1 capsule (1,000 Units total) by mouth daily. (Patient not taking: Reported on 09/18/2016) 30 capsule 6  . Ipratropium-Albuterol (COMBIVENT) 20-100 MCG/ACT AERS respimat Inhale 1 puff into the lungs every 6 (six) hours. (Patient not taking: Reported on 09/18/2016) 1 Inhaler 5    REVIEW OF SYSTEMS:    REVIEW OF SYSTEMS (negative unless checked):   Cardiac:  [x]  Chest pain or chest pressure? "here and there" []  Shortness of breath upon activity? []  Shortness of breath when lying flat? []  Irregular heart rhythm?  Vascular:  []  Pain in calf, thigh, or hip brought on by walking? []  Pain in feet at night that wakes you up from your sleep? []  Blood clot in your veins? [x]  Leg swelling?  Pulmonary:  []  Oxygen at home? []  Productive cough? []  Wheezing?  Neurologic:  []  Sudden weakness in arms or legs? []  Sudden numbness in arms or legs? []  Sudden onset of difficult speaking or slurred speech? []  Temporary loss of vision in one eye? []  Problems with dizziness?  Gastrointestinal:  []  Blood in stool? []  Vomited blood?  Genitourinary:  []  Burning when urinating? []  Blood in urine?  Psychiatric:  []  Major depression  Hematologic:  []  Bleeding problems? []  Problems with blood clotting?  Dermatologic:  []  Rashes or ulcers?  Constitutional:  []  Fever or chills?  Ear/Nose/Throat:  []  Change in hearing? []  Nose bleeds? []  Sore throat?  Musculoskeletal:  []  Back pain? []  Joint pain? []  Muscle pain?  PHYSICAL EXAM:    Vitals:   09/18/16 1400 09/18/16 1418 09/18/16 1447 09/18/16 1500  BP: (!) 144/119 (!) 144/119 (!) 163/63 (!) 173/70  Pulse: 63 79 66 (!) 58   Resp:  20 18   Temp:      TempSrc:      SpO2: 100% 100% 97% 100%  Weight:      Height:        GENERAL: The patient is a well-nourished obese female, in no acute distress. The vital signs are documented above. HEENT: normocephalic, atraumatic, no abnormalities noted.  CARDIAC: There is a regular rate and rhythm. No carotid bruits. VASCULAR: 2+ radial and 2+ brachial pulses bilaterally. PULMONARY: There is good air exchange bilaterally without wheezing or rales. ABDOMEN: Soft and non-tender with normal pitched bowel sounds.  MUSCULOSKELETAL: There are no major deformities or cyanosis. NEUROLOGIC: No focal weakness or paresthesias are detected. SKIN: There are no ulcers or rashes noted. PSYCHIATRIC: The patient has a normal affect.  ASSESSMENT/PLAN:     CKD stage V  Patient currently does not require urgent hemodialysis. Vein mapping has been ordered. She is right-handed. We'll make access recommendations based on vein mapping results. Plan for permanent dialysis access sometime next week.   Virgina Jock, PA-C [facility] [place] [phone]   Will plan for access next week once veni mapping has been completed.  Annamarie Major

## 2016-09-18 NOTE — ED Provider Notes (Signed)
[facility] DEPT Provider Note   CSN: [id number] Arrival date & time: 09/18/16  2876     History   Chief Complaint Chief Complaint  Patient presents with  . Abnormal Lab    HPI Joan Banks is a 71 y.o. female.  71yo F w/ PMH including HTN, HLD, CKD, COPD, anemia who p/w abnormal labwork. She saw her PCP yesterday for routine appointment and had routine labs drawn. She was called today to go to ED for abnormal labs--hyperkalemia. She reports some shortness of breath with exertion but no shortness of breath at rest and no chest pain. She reports intermittent LE edema, recent intentional 30 lb weight loss. No urinary symptoms, vomiting, diarrhea, or recent illness. She states she is "mostly" complaint with medications.   The history is provided by the patient.  Abnormal Lab    Past Medical History:  Diagnosis Date  . Anemia, iron deficiency   . 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 Eston Esters.  . Chronic renal insufficiency   . COPD (chronic obstructive pulmonary disease) ([place])   . Dental caries   . Diabetes mellitus   . Diabetic peripheral neuropathy ([place])   . Gastritis and duodenitis 1996   H. pylori positive  . Hyperlipidemia   . Hypertension   . Low back pain   . OSA (obstructive sleep apnea)    Sleep study 12/23/2003 showed very severe obstructive sleep apnea/hypopnea syndrome, RDI 136 per hour, with severe oxygen desaturation to 50% on room air before CPAP.  Marland Kitchen Uterine leiomyoma    Hx of massive leiomyomata uteri; S/P total abdominal hysterectomy, right salpingo-oophorectomy, and debulking of benign fibroid from vesicouterine space 11/18/99   . Visual impairment     Patient Active Problem List   Diagnosis Date Noted  . Vitamin D deficiency 05/01/2014  . Preventative health care 09/16/2011  . Bilateral chronic knee pain  08/15/2008  . Stage 3 chronic kidney disease due to type 2 diabetes mellitus ([place]) 12/15/2007  . History of ductal carcinoma in situ (DCIS) of breast 09/23/2007  . Type 2 diabetes mellitus with peripheral neuropathy ([place]) 03/10/2006  . Hyperlipidemia associated with type 2 diabetes mellitus ([place]) 03/10/2006  . Anemia associated with chronic renal failure 03/10/2006  . Hypertension associated with diabetes ([place]) 03/10/2006  . COPD (chronic obstructive pulmonary disease) ([place]) 03/10/2006  . Sleep apnea 03/10/2006    Past Surgical History:  Procedure Laterality Date  . BREAST BIOPSY  2003   Benign Core   . BREAST BIOPSY  1999   Stereo Core   . 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 Eston Esters.  . CONDYLOMA EXCISION/FULGURATION  11/18/1999   Perianal condylomas.  Marland Kitchen LIPOMA EXCISION  07/14/1998 & 04/14/2001   S/P excision of lipoma, right shoulder measured as 6 cm x 4 cm by Dr. Kathrin Penner 07/14/1998.  Marland Kitchen TOTAL ABDOMINAL HYSTERECTOMY  11/18/1999   Hx of massive leiomyomata uteri; S/P total abdominal hysterectomy, right salpingo-oophorectomy, and debulking of benign fibroid from vesicouterine space 11/18/99.    OB History    No data available       Home Medications    Prior to Admission medications   Medication Sig Start Date End Date Taking? Authorizing Provider  amLODipine (NORVASC) 10 MG tablet  take 1 tablet by mouth every morning Patient taking differently: Take 10mg  by mouth every morning 05/28/16  Yes Rivet, Sindy Guadeloupe, MD  aspirin 81 MG EC tablet Take 1 tablet (81 mg total) by mouth daily. 01/21/11  Yes Bertha Stakes, MD  atenolol (TENORMIN) 50 MG tablet Take 1 tablet (50 mg total) by mouth 2 (two) times daily. Patient taking differently: Take 100 mg by mouth daily.  09/14/16  Yes Tawny Asal, MD  cloNIDine (CATAPRES) 0.3 MG tablet Take 1 tablet (0.3 mg  total) by mouth 3 (three) times daily. Patient taking differently: Take 0.9 mg by mouth 2 (two) times daily.  03/17/16  Yes Rivet, Carly J, MD  enalapril (VASOTEC) 20 MG tablet Take 40 mg by mouth daily. 07/13/16  Yes [provider]  ferrous sulfate 325 (65 FE) MG tablet Take 1 tablet (325 mg total) by mouth 2 (two) times daily. Patient taking differently: Take 325 mg by mouth daily with breakfast.  12/02/12  Yes Bertha Stakes, MD  Insulin Glargine (LANTUS SOLOSTAR) 100 UNIT/ML Solostar Pen Inject 45 Units into the skin daily at 10 pm. INJECT 30 UNITS INTO SKIN AT BEDTIME Patient taking differently: Inject 50 Units into the skin daily at 10 pm.  03/17/16  Yes Rivet, Sindy Guadeloupe, MD  mometasone (NASONEX) 50 MCG/ACT nasal spray instill 2 sprays into each nostril once daily 05/12/16  Yes Rivet, Carly J, MD  omeprazole (PRILOSEC) 20 MG capsule take 2 capsules by mouth daily 06/08/16  Yes Rivet, Carly J, MD  simvastatin (ZOCOR) 20 MG tablet Take 1 tablet (20 mg total) by mouth daily. 03/17/16  Yes Rivet, Sindy Guadeloupe, MD  sitaGLIPtin (JANUVIA) 50 MG tablet Take 1 tablet (50 mg total) by mouth daily. 03/17/16  Yes Rivet, Sindy Guadeloupe, MD  Cholecalciferol (VITAMIN D3) 1000 units CAPS Take 1 capsule (1,000 Units total) by mouth daily. Patient not taking: Reported on 09/18/2016 03/17/16   Rivet, Sindy Guadeloupe, MD  Ipratropium-Albuterol (COMBIVENT) 20-100 MCG/ACT AERS respimat Inhale 1 puff into the lungs every 6 (six) hours. Patient not taking: Reported on 09/18/2016 03/17/16   RivetSindy Guadeloupe, MD    Family History Family History  Problem Relation Age of Onset  . Breast cancer Sister   . Cancer Mother        Marena Chancy of type.    Social History Social History  Substance Use Topics  . Smoking status: Former Smoker    Packs/day: 0.50    Years: 30.00    Types: Cigarettes    Quit date: 02/03/2000  . Smokeless tobacco: Never Used  . Alcohol use No     Allergies   Patient has no known allergies.   Review of  Systems Review of Systems All other systems reviewed and are negative except that which was mentioned in HPI   Physical Exam Updated Vital Signs BP (!) 153/69   Pulse 66   Temp 98.4 F (36.9 C) (Oral)   Resp 18   Ht 5\' 5"  (1.651 m)   Wt 106.1 kg (234 lb)   SpO2 99%   BMI 38.94 kg/m   Physical Exam  Constitutional: She is oriented to person, place, and time. She appears well-developed and well-nourished. No distress.  Sitting in chair, comfortable  HENT:  Head: Normocephalic and atraumatic.  Moist mucous membranes  Eyes: Pupils are equal, round, and reactive to light. Conjunctivae are normal.  Neck: Neck supple.  Cardiovascular: Normal rate and regular rhythm.   Murmur heard.  Systolic murmur is present  with a grade of 2/6  Pulmonary/Chest: Effort normal and breath sounds normal.  Abdominal: Soft. Bowel sounds are normal. She exhibits no distension. There is no tenderness.  Musculoskeletal: She exhibits edema (2+ to knees).  Neurological: She is alert and oriented to person, place, and time.  Fluent speech  Skin: Skin is warm and dry.  Psychiatric: She has a normal mood and affect. Judgment normal.  Nursing note and vitals reviewed.    ED Treatments / Results  Labs (all labs ordered are listed, but only abnormal results are displayed) Labs Reviewed  BASIC METABOLIC PANEL - Abnormal; Notable for the following:       Result Value   Potassium 5.9 (*)    Chloride 113 (*)    CO2 19 (*)    Glucose, Bld 193 (*)    BUN 61 (*)    Creatinine, Ser 4.19 (*)    GFR calc non Af Amer 10 (*)    GFR calc Af Amer 11 (*)    All other components within normal limits  CBC - Abnormal; Notable for the following:    RBC 3.41 (*)    Hemoglobin 7.9 (*)    HCT 26.5 (*)    MCV 77.7 (*)    MCH 23.2 (*)    MCHC 29.8 (*)    All other components within normal limits  BRAIN NATRIURETIC PEPTIDE - Abnormal; Notable for the following:    B Natriuretic Peptide 118.5 (*)    All other  components within normal limits  I-STAT TROPONIN, ED    EKG  EKG Interpretation  Date/Time:  Friday September 18 2016 08:47:00 EDT Ventricular Rate:  70 PR Interval:  188 QRS Duration: 102 QT Interval:  394 QTC Calculation: 425 R Axis:   -11 Text Interpretation:  Normal sinus rhythm Cannot rule out Anterior infarct , age undetermined Abnormal ECG No significant change since last tracing Confirmed by Theotis Burrow [phone]) on 09/18/2016 9:12:11 AM       Radiology Dg Chest 2 View  Result Date: 09/18/2016 CLINICAL DATA:  Dyspnea on exertion. Chronic renal failure. Lower extremity edema. EXAM: CHEST  2 VIEW COMPARISON:  06/30/2012 chest radiograph. FINDINGS: Stable cardiomediastinal silhouette with mild cardiomegaly and aortic atherosclerosis. No pneumothorax. No pleural effusion. Patchy right lung base opacity. Cephalization of the pulmonary vasculature with borderline mild pulmonary edema. IMPRESSION: 1. Cardiomegaly, cephalization of the pulmonary vasculature and borderline mild pulmonary edema, suggesting mild congestive heart failure. 2. Patchy right lung base opacity, which could represent atelectasis, aspiration or pneumonia. Recommend follow-up PA and lateral post treatment chest radiographs in 4-6 weeks. Electronically Signed   By: Ilona Sorrel M.D.   On: 09/18/2016 11:44    Procedures Procedures (including critical care time)  Medications Ordered in ED Medications  furosemide (LASIX) injection 40 mg (not administered)     Initial Impression / Assessment and Plan / ED Course  I have reviewed the triage vital signs and the nursing notes.  Pertinent labs & imaging results that were available during my care of the patient were reviewed by me and considered in my medical decision making (see chart for details).    PT sent to ED after routine labs showed hyperkalemia. On my exam, She was sitting in a chair comfortable, no complaints. Vital signs notable for hypertension, normal O2  saturation on room air. She had some peripheral edema, no respiratory distress. EKG shows no signs of severe hyperkalemic changes. Her lab work does confirm hyperkalemia with a potassium of 5.9,  worsening creatinine at 4.2. Stable anemia at 7.9 today. She states she has had normal urination therefore I initially ordered IV Lasix. Discussed admission with internal medicine teaching service who will admit for further care.  Later discussed w/ Dr. Mercy Moore who had been consulted by primary team. The patient has had difficult IV access despite multiple attempts by nurses and IV team. Because she is not currently on lasix and is tolerating PO, he recommended switching order to 160mg  PO lasix which I have ordered.  Final Clinical Impressions(s) / ED Diagnoses   Final diagnoses:  Acute renal failure superimposed on chronic kidney disease, unspecified CKD stage, unspecified acute renal failure type (HCC)  Hyperkalemia  Congestive heart failure, unspecified HF chronicity, unspecified heart failure type [facility])    New Prescriptions New Prescriptions   No medications on file     Vieno Tarrant, Wenda Overland, MD 09/18/16 1616

## 2016-09-18 NOTE — ED Notes (Signed)
Pt has been in the br for an hour intermittently  She is having stools  No one has been able to start an iv after numerus attempts

## 2016-09-18 NOTE — Assessment & Plan Note (Signed)
BP Readings from Last 3 Encounters:  09/18/16 (!) 156/75  09/17/16 (!) 121/53  03/17/16 (!) 158/71   Her blood pressure during clinic visit was normal at 121/53. According to patient she is compliant with her medications.  -At this time I will continue her existing regimen including clonidine, amlodipine and enalapril. We will check BMP today and if her renal functions are deteriorating, we will discontinue enalapril. -On BMP she is CK D stage V now. She was having elevated potassium-result was called to on call resident who advised her to come to ED periods. -Enalapril was discontinued.

## 2016-09-18 NOTE — ED Notes (Signed)
No iv numerus attempts including ultra sound  unable

## 2016-09-18 NOTE — ED Notes (Signed)
Pt brought back in wheelchair and helped to the bathroom in wheelchair.  Pt is in no acute distress.  Pt reports being sent here for "high potassium" pt is not aware of value that her PCP found for her K.  Delay explained to pt.

## 2016-09-18 NOTE — ED Notes (Signed)
Unable to give report  rn will call back 

## 2016-09-18 NOTE — ED Notes (Signed)
Pt sitting at the bedside  She is hungry  I have no rders for diet yet  She has pulled off all her wires  And she has had them off for awhile according to the nurse leaving

## 2016-09-18 NOTE — Assessment & Plan Note (Signed)
On repeat CBC today her hemoglobin decreased to 7.4. She is having high ferritin and normal iron levels-consistent with anemia of chronic illness most likely because of her chronic renal disease.  She will benefit with start of aranesp.  She also needs a referral for nephrology.

## 2016-09-18 NOTE — Assessment & Plan Note (Signed)
She is compliant with her CPAP. According to patient she has no problem with her new machine, she just wanted a new strap. I advised her to contact her home health agencies supplying CPAP.  -She was advised to continue with her CPAP.

## 2016-09-19 ENCOUNTER — Other Ambulatory Visit (HOSPITAL_COMMUNITY): Payer: Medicare Other

## 2016-09-19 ENCOUNTER — Encounter (HOSPITAL_COMMUNITY): Payer: Medicare Other

## 2016-09-19 ENCOUNTER — Inpatient Hospital Stay (HOSPITAL_BASED_OUTPATIENT_CLINIC_OR_DEPARTMENT_OTHER): Payer: Medicare Other

## 2016-09-19 DIAGNOSIS — E559 Vitamin D deficiency, unspecified: Secondary | ICD-10-CM

## 2016-09-19 DIAGNOSIS — I120 Hypertensive chronic kidney disease with stage 5 chronic kidney disease or end stage renal disease: Secondary | ICD-10-CM | POA: Diagnosis not present

## 2016-09-19 DIAGNOSIS — E785 Hyperlipidemia, unspecified: Secondary | ICD-10-CM

## 2016-09-19 DIAGNOSIS — E1169 Type 2 diabetes mellitus with other specified complication: Secondary | ICD-10-CM | POA: Diagnosis not present

## 2016-09-19 DIAGNOSIS — N186 End stage renal disease: Secondary | ICD-10-CM | POA: Diagnosis not present

## 2016-09-19 DIAGNOSIS — E1159 Type 2 diabetes mellitus with other circulatory complications: Secondary | ICD-10-CM | POA: Diagnosis not present

## 2016-09-19 DIAGNOSIS — E875 Hyperkalemia: Secondary | ICD-10-CM | POA: Diagnosis not present

## 2016-09-19 DIAGNOSIS — D631 Anemia in chronic kidney disease: Secondary | ICD-10-CM | POA: Diagnosis not present

## 2016-09-19 DIAGNOSIS — D509 Iron deficiency anemia, unspecified: Secondary | ICD-10-CM | POA: Diagnosis not present

## 2016-09-19 DIAGNOSIS — N185 Chronic kidney disease, stage 5: Secondary | ICD-10-CM | POA: Diagnosis not present

## 2016-09-19 DIAGNOSIS — E1122 Type 2 diabetes mellitus with diabetic chronic kidney disease: Secondary | ICD-10-CM | POA: Diagnosis not present

## 2016-09-19 LAB — ECHOCARDIOGRAM COMPLETE
E decel time: 229 msec
EERAT: 16.89
FS: 27 % — AB (ref 28–44)
Height: 63 in
IVS/LV PW RATIO, ED: 1.08
LA ID, A-P, ES: 42 mm
LA diam end sys: 42 mm
LADIAMINDEX: 2.03 cm/m2
LAVOL: 63 mL
LAVOLA4C: 70.3 mL
LAVOLIN: 30.4 mL/m2
LV TDI E'LATERAL: 4.05
LV e' LATERAL: 4.05 cm/s
LVEEAVG: 16.89
LVEEMED: 16.89
LVOT VTI: 20.9 cm
LVOT area: 3.8 cm2
LVOT diameter: 22 mm
LVOT peak grad rest: 4 mmHg
LVOT peak vel: 105 cm/s
LVOTSV: 79 mL
Lateral S' vel: 16.2 cm/s
MV Dec: 229
MV pk E vel: 68.4 m/s
MVPKAVEL: 97.9 m/s
PW: 12 mm — AB (ref 0.6–1.1)
RV TAPSE: 28.2 mm
TDI e' medial: 3.21
Weight: 3744 oz

## 2016-09-19 LAB — GLUCOSE, CAPILLARY
Glucose-Capillary: 150 mg/dL — ABNORMAL HIGH (ref 65–99)
Glucose-Capillary: 404 mg/dL — ABNORMAL HIGH (ref 65–99)

## 2016-09-19 LAB — URINALYSIS, ROUTINE W REFLEX MICROSCOPIC
BILIRUBIN URINE: NEGATIVE
Glucose, UA: NEGATIVE mg/dL
HGB URINE DIPSTICK: NEGATIVE
Ketones, ur: NEGATIVE mg/dL
Leukocytes, UA: NEGATIVE
Nitrite: NEGATIVE
PROTEIN: 30 mg/dL — AB
Specific Gravity, Urine: 1.006 (ref 1.005–1.030)
pH: 5 (ref 5.0–8.0)

## 2016-09-19 LAB — RENAL FUNCTION PANEL
ALBUMIN: 3.4 g/dL — AB (ref 3.5–5.0)
Anion gap: 10 (ref 5–15)
BUN: 56 mg/dL — ABNORMAL HIGH (ref 6–20)
CALCIUM: 9.5 mg/dL (ref 8.9–10.3)
CO2: 19 mmol/L — AB (ref 22–32)
CREATININE: 4.12 mg/dL — AB (ref 0.44–1.00)
Chloride: 112 mmol/L — ABNORMAL HIGH (ref 101–111)
GFR calc non Af Amer: 10 mL/min — ABNORMAL LOW (ref >60)
GFR, EST AFRICAN AMERICAN: 11 mL/min — AB (ref >60)
GLUCOSE: 207 mg/dL — AB (ref 65–99)
PHOSPHORUS: 4.2 mg/dL (ref 2.5–4.6)
Potassium: 5 mmol/L (ref 3.5–5.1)
SODIUM: 141 mmol/L (ref 135–145)

## 2016-09-19 LAB — PROTEIN / CREATININE RATIO, URINE
CREATININE, URINE: 46.58 mg/dL
Protein Creatinine Ratio: 0.73 mg/mg{Cre} — ABNORMAL HIGH (ref 0.00–0.15)
Total Protein, Urine: 34 mg/dL

## 2016-09-19 LAB — VITAMIN D 25 HYDROXY (VIT D DEFICIENCY, FRACTURES): VIT D 25 HYDROXY: 19.2 ng/mL — AB (ref 30.0–100.0)

## 2016-09-19 LAB — PARATHYROID HORMONE, INTACT (NO CA): PTH: 538 pg/mL — ABNORMAL HIGH (ref 15–65)

## 2016-09-19 MED ORDER — KIDNEY FAILURE BOOK
Freq: Once | Status: AC
  Administered 2016-09-19: 06:00:00
  Filled 2016-09-19: qty 1

## 2016-09-19 MED ORDER — INSULIN ASPART 100 UNIT/ML ~~LOC~~ SOLN
11.0000 [IU] | Freq: Once | SUBCUTANEOUS | Status: AC
  Administered 2016-09-19: 11 [IU] via SUBCUTANEOUS

## 2016-09-19 MED ORDER — FUROSEMIDE 80 MG PO TABS: 160.0000 mg | ORAL_TABLET | Freq: Two times a day (BID) | ORAL | 0 refills | Status: DC

## 2016-09-19 MED ORDER — DARBEPOETIN ALFA 100 MCG/0.5ML IJ SOSY: 100.0000 ug | PREFILLED_SYRINGE | INTRAMUSCULAR | 2 refills | Status: AC

## 2016-09-19 MED ORDER — DARBEPOETIN ALFA 100 MCG/0.5ML IJ SOSY: 100.0000 ug | PREFILLED_SYRINGE | INTRAMUSCULAR | Status: DC

## 2016-09-19 MED ORDER — FUROSEMIDE 80 MG PO TABS: 160.0000 mg | ORAL_TABLET | Freq: Two times a day (BID) | ORAL | Status: DC

## 2016-09-19 NOTE — Progress Notes (Signed)
  Echocardiogram 2D Echocardiogram has been performed.  Johny Chess 09/19/2016, 3:09 PM

## 2016-09-19 NOTE — Progress Notes (Signed)
S: No new CO except some diarrhea from the kayexalate.  Says she is making a lot of urine O:BP 138/76 (BP Location: Left Arm)   Pulse 77   Temp 97.7 F (36.5 C) (Oral)   Resp 18   Ht 5\' 3"  (1.6 m)   Wt 106.1 kg (234 lb)   SpO2 98%   BMI 41.45 kg/m  No intake or output data in the 24 hours ending 09/19/16 0839 Weight change:  ION:GEXBM and alert CVS:RRR 1/6 systolic M Resp:Clear Abd:+ BS NTND Ext: No edema NEURO:CNI except Rt II, Ox3, No asterixis   . amLODipine  10 mg Oral q morning - 10a  . aspirin EC  81 mg Oral Daily  . cloNIDine  0.3 mg Oral BID  . darbepoetin (ARANESP) injection - NON-DIALYSIS  100 mcg Subcutaneous Q Fri-1800  . dextrose  1 ampule Intravenous Once  . ferrous sulfate  325 mg Oral Q breakfast  . furosemide  160 mg Oral TID WC  . heparin  5,000 Units Subcutaneous Q8H  . insulin aspart  0-5 Units Subcutaneous QHS  . insulin aspart  0-9 Units Subcutaneous TID WC  . insulin glargine  30 Units Subcutaneous QHS  . pantoprazole  40 mg Oral Daily  . simvastatin  20 mg Oral QHS  . sodium chloride flush  3 mL Intravenous Q12H   Dg Chest 2 View  Result Date: 09/18/2016 CLINICAL DATA:  Dyspnea on exertion. Chronic renal failure. Lower extremity edema. EXAM: CHEST  2 VIEW COMPARISON:  06/30/2012 chest radiograph. FINDINGS: Stable cardiomediastinal silhouette with mild cardiomegaly and aortic atherosclerosis. No pneumothorax. No pleural effusion. Patchy right lung base opacity. Cephalization of the pulmonary vasculature with borderline mild pulmonary edema. IMPRESSION: 1. Cardiomegaly, cephalization of the pulmonary vasculature and borderline mild pulmonary edema, suggesting mild congestive heart failure. 2. Patchy right lung base opacity, which could represent atelectasis, aspiration or pneumonia. Recommend follow-up PA and lateral post treatment chest radiographs in 4-6 weeks. Electronically Signed   By: Ilona Sorrel M.D.   On: 09/18/2016 11:44   BMET    Component  Value Date/Time   NA 141 09/19/2016 0453   NA 143 09/17/2016 1136   K 5.0 09/19/2016 0453   CL 112 (H) 09/19/2016 0453   CO2 19 (L) 09/19/2016 0453   GLUCOSE 207 (H) 09/19/2016 0453   BUN 56 (H) 09/19/2016 0453   BUN 56 (H) 09/17/2016 1136   CREATININE 4.12 (H) 09/19/2016 0453   CREATININE 1.68 (H) 05/01/2014 1300   CALCIUM 9.5 09/19/2016 0453   CALCIUM 9.8 12/21/2013 1243   GFRNONAA 10 (L) 09/19/2016 0453   GFRNONAA 31 (L) 05/01/2014 1300   GFRAA 11 (L) 09/19/2016 0453   GFRAA 35 (L) 05/01/2014 1300   CBC    Component Value Date/Time   WBC 8.7 09/18/2016 0856   RBC 3.41 (L) 09/18/2016 0856   HGB 7.9 (L) 09/18/2016 0856   HGB 7.4 (L) 09/17/2016 1136   HGB 8.9 (L) 06/30/2005 0944   HCT 26.5 (L) 09/18/2016 0856   HCT 24.7 (L) 09/17/2016 1136   HCT 28.2 (L) 06/30/2005 0944   PLT 201 09/18/2016 0856   PLT 226 09/17/2016 1136   MCV 77.7 (L) 09/18/2016 0856   MCV 77 (L) 09/17/2016 1136   MCV 74.2 (L) 06/30/2005 0944   MCH 23.2 (L) 09/18/2016 0856   MCHC 29.8 (L) 09/18/2016 0856   RDW 15.3 09/18/2016 0856   RDW 15.6 (H) 09/17/2016 1136   RDW 15.7 (H) 06/30/2005 8413  LYMPHSABS 1.7 03/17/2016 1524   LYMPHSABS 1.4 06/30/2005 0944   MONOABS 0.7 12/12/2013 1228   MONOABS 0.5 06/30/2005 0944   EOSABS 0.2 03/17/2016 1524   BASOSABS 0.0 03/17/2016 1524   BASOSABS 0.0 06/30/2005 0944     Assessment:  1. CKD 5 sec DM/HTN 2. Hyperkalemia, improved 3. Anemia sec CKD, iron lvels OK.  On aranesp 4. HTN 5. DM 6. Sec HPTH  PTH 538   Plan: 1. K better.  Not sure about UO as nothing recorded 2. Decrease lasix to BID 3. Recheck labs in AM 4. Start calcitriol as outpt as she needs to show that she can FU before starting it   Joan Banks

## 2016-09-19 NOTE — Progress Notes (Signed)
Awaiting vein mapping to determine access plans.  Will plan for surgery next week   Joan Banks

## 2016-09-19 NOTE — Progress Notes (Signed)
Difficult study secondary to depth of vessels    Right  Upper Extremity Vein Map       Basilic  Segment Diameter Depth Comment  1. Axilla mm mm Not visualized  2. Mid upper arm 2.11mm mm   3. Above AC mm mm Not visualized  4. In AC 1.65mm mm   5. Below AC 2.87mm mm   6. Mid forearm 2.50mm mm   7. Wrist mm mm Not visualized   mm mm    mm mm    mm mm   Left Upper Extremity Vein Map    Cephalic  Segment Diameter Depth Comment  1. Axilla mm mm Not visualized  2. Mid upper arm mm mm Not visualized  3. Above AC 3.42mm 18mm   4. In AC 58mm 35mm   5. Below AC 2.53mm 9mm   6. Mid forearm 1.18mm 80mm   7. Wrist mm mm Not visualized   mm mm    mm mm    mm mm    Basilic  Segment Diameter Depth Comment  1. Axilla 2.49mm 48mm   2. Mid upper arm 2.44mm 56mm   3. Above AC 2.50mm 8.18mm   4. In AC 2.6mm 15mm   5. Below AC mm mm Not visualized  6. Mid forearm mm mm Not visualized  7. Wrist mm mm Not visualized         Brachial       1.Upper arm 48mm 49mm   2. Mid Upper arm 5.62mm 73mm   3. AC 3.47mm 74mm

## 2016-09-19 NOTE — Care Management Obs Status (Signed)
[facility] NOTIFICATION   Patient Details  Name: Joan Banks MRN: [id number] Date of Birth: July 25, 1945   Medicare Observation Status Notification Given:  Yes    CrutchfieldAntony Haste, RN 09/19/2016, 3:28 PM

## 2016-09-19 NOTE — Discharge Summary (Signed)
Name: Joan Banks MRN: [id number] DOB: February 05, 1945 71 y.o. PCP: Joan Asal, MD  Date of Admission: 09/18/2016  9:03 AM Date of Discharge: 09/19/2016 Attending Physician: Joan Linsey, MD  Discharge Diagnosis: 1. Hyperkalemia 2. Stage 5 CKD 3. Hypertension  Discharge Medications: Allergies as of 09/19/2016   No Known Allergies     Medication List    STOP taking these medications   enalapril 20 MG tablet Commonly known as:  VASOTEC     TAKE these medications   amLODipine 10 MG tablet Commonly known as:  NORVASC take 1 tablet by mouth every morning What changed:  See the new instructions.   aspirin 81 MG EC tablet Take 1 tablet (81 mg total) by mouth daily.   atenolol 50 MG tablet Commonly known as:  TENORMIN Take 1 tablet (50 mg total) by mouth 2 (two) times daily. What changed:  how much to take  when to take this   cloNIDine 0.3 MG tablet Commonly known as:  CATAPRES Take 1 tablet (0.3 mg total) by mouth 3 (three) times daily. What changed:  how much to take  when to take this   Darbepoetin Alfa 100 MCG/0.5ML Sosy injection Commonly known as:  ARANESP Inject 0.5 mLs (100 mcg total) into the skin every Friday at 6 PM.   ferrous sulfate 325 (65 FE) MG tablet Take 1 tablet (325 mg total) by mouth 2 (two) times daily. What changed:  when to take this   furosemide 80 MG tablet Commonly known as:  LASIX Take 2 tablets (160 mg total) by mouth 2 (two) times daily.   Insulin Glargine 100 UNIT/ML Solostar Pen Commonly known as:  LANTUS SOLOSTAR Inject 45 Units into the skin daily at 10 pm. INJECT 30 UNITS INTO SKIN AT BEDTIME What changed:  how much to take  additional instructions   Ipratropium-Albuterol 20-100 MCG/ACT Aers respimat Commonly known as:  COMBIVENT Inhale 1 puff into the lungs every 6 (six) hours.   mometasone 50 MCG/ACT nasal spray Commonly known as:  NASONEX instill 2 sprays into each nostril once daily   omeprazole  20 MG capsule Commonly known as:  PRILOSEC take 2 capsules by mouth daily   simvastatin 20 MG tablet Commonly known as:  ZOCOR Take 1 tablet (20 mg total) by mouth daily.   sitaGLIPtin 50 MG tablet Commonly known as:  JANUVIA Take 1 tablet (50 mg total) by mouth daily.   Vitamin D3 1000 units Caps Take 1 capsule (1,000 Units total) by mouth daily.       Disposition and follow-up:   Joan Banks was discharged from [facility] in Stable condition.    1.  At the hospital follow up visit please address:  - her follow up with nephrology and vascular surgery  2.  Labs / imaging needed at time of follow-up: BMET  3.  Pending labs/ test needing follow-up: none  Follow-up Appointments: Follow-up Information    Joan Mitchell, MD Follow up.   Specialties:  Vascular Surgery, Cardiology Why:  Our office will contact you on Monday 09/21/16 to schedule surgery for dialysis access.  Contact information: [address] [place] [address] [phone]        Joan Asal, MD. Call.   Specialty:  Internal Medicine Why:  Please call on Monday 09/21/2016 to schedule a hospital follow up appointment. Contact information: [place] [address] [phone]        Joan Shiley, MD. Call.  Specialty:  Nephrology Why:  Please call on Monday 09/21/2016 to schedule a hospital follow up appointment Contact information: [place] [address] [place] by problem list:  1. Hyperkalemia: She presented to the ED after routine outpatient labs revealed potassium of 6.7.  Upon presentation to the ED, her potassium on recheck was 5.9.  EKG revealed no T-wave changes.  She was given Kayexalate, Lasix, insulin and D-50.  Her potassium normalized on morning labs and she was given dietary information on low potassium diet.   2.  Stage 5 CKD: She is approaching the need for hemodialysis and, as a result,  nephrology was consulted to assist with this management.  She has previously seen Joan Banks at [facility] but she has not followed up recently.  Vascular surgery was also consulted to assist with access planning for HD.  Vein mapping was completed in the hospital and she will be scheduled as an outpatient with vascular surgery to discuss AV fistula placement.  There were currently no indications for urgent dialysis and she is experiencing no uremic symptoms.  She was therefore discharged home to complete further preparation in the near future for the initiation of HD.  Her Lisinopril was discontinued due to worsening renal function and she was started on Lasix 160mg  PO BID.    3. Hypertension: Continue home amlodipine 10mg  daily, atenolol 50mg  BID at discharge.  Stop enalapril given worsening renal function and addition of Lasix BID.  Continue her clonidine at prescribed dose of 0.3mg  TID  4. Diabetes: She was discharged on her home regimen of 45 units QHS.    Discharge Vitals:   BP 138/76 (BP Location: Left Arm)   Pulse 77   Temp 97.7 F (36.5 C) (Oral)   Resp 18   Ht 5\' 3"  (1.6 m)   Wt 234 lb (106.1 kg)   SpO2 98%   BMI 41.45 kg/m   Pertinent Labs, Studies, and Procedures:  BMP Latest Ref Rng & Units 09/19/2016 09/18/2016 09/17/2016  Glucose 65 - 99 mg/dL 207(H) 193(H) 201(H)  BUN 6 - 20 mg/dL 56(H) 61(H) 56(H)  Creatinine 0.44 - 1.00 mg/dL 4.12(H) 4.19(H) 4.08(H)  BUN/Creat Ratio 12 - 28 - - 14  Sodium 135 - 145 mmol/L 141 139 143  Potassium 3.5 - 5.1 mmol/L 5.0 5.9(H) 6.7(>)  Chloride 101 - 111 mmol/L 112(H) 113(H) 114(H)  CO2 22 - 32 mmol/L 19(L) 19(L) 15(L)  Calcium 8.9 - 10.3 mg/dL 9.5 9.9 9.4   Lab Results  Component Value Date   WBC 8.7 09/18/2016   HGB 7.9 (L) 09/18/2016   HCT 26.5 (L) 09/18/2016   MCV 77.7 (L) 09/18/2016   PLT 201 09/18/2016   PTH 538 Vitamin D 25-hydroxy 19.2 Urine creatinine 46.58 Urine total protein 34 Protein creatinine ratio  0.73  Discharge Instructions: Discharge Instructions    Call MD for:  difficulty breathing, headache or visual disturbances    Complete by:  As directed    Call MD for:  extreme fatigue    Complete by:  As directed    Call MD for:  hives    Complete by:  As directed    Call MD for:  persistant dizziness or light-headedness    Complete by:  As directed    Call MD for:  persistant nausea and vomiting    Complete by:  As directed    Call MD for:  redness, tenderness, or  signs of infection (pain, swelling, redness, odor or green/yellow discharge around incision site)    Complete by:  As directed    Call MD for:  severe uncontrolled pain    Complete by:  As directed    Call MD for:  temperature >100.4    Complete by:  As directed    Diet - low sodium heart healthy    Complete by:  As directed    Discharge instructions    Complete by:  As directed    Joan Banks,  Your kidney function has gotten worse and your potassium was high.  As a result you were admitted to the hospital.  On Monday morning, please call the nephrology office and internal medicine clinic for an appointment.  The vascular surgery office will also call you about setting up surgery for dialysis access planning.   Please see your After Visit Summary regarding medication changes to your blood pressure medicines.   Increase activity slowly    Complete by:  As directed       Signed: Jule Ser, DO 09/19/2016, 12:00 PM   Pager: [phone]

## 2016-09-19 NOTE — Care Management CC44 (Signed)
Condition Code 44 Documentation Completed  Patient Details  Name: VONETTE GROSSO MRN: [id number] Date of Birth: 11/14/1945   Condition Code 44 given:  Yes Patient signature on Condition Code 44 notice:  Yes Documentation of 2 MD's agreement:  Yes Code 44 added to claim:  Yes    Delrae Sawyers, RN 09/19/2016, 3:28 PM

## 2016-09-19 NOTE — Progress Notes (Signed)
Patient discharge teaching given, including activity, diet, follow-up appoints, and medications. Patient verbalized understanding of all discharge instructions. Vitals are stable. Skin is intact except as charted in most recent assessments. Pt to be escorted out by NT, to be driven home by family. 

## 2016-09-19 NOTE — Progress Notes (Addendum)
Pt is without IV access. MD made aware and asked what they would like to do about the access. . MD resident informed says he would follow it up with the day MD team. Will continue to monitor.

## 2016-09-19 NOTE — H&P (Signed)
Internal Medicine Attending Admission Note Date: 09/19/2016  Patient name: Joan Banks Medical record number: [id number] Date of birth: 1945-08-03 Age: 71 y.o. Gender: female  I saw and evaluated the patient. I reviewed the resident's note and I agree with the resident's findings and plan as documented in the resident's note.  Chief Complaint(s): Hyperkalemia.  History - key components related to admission:  Joan Banks is a 71 year old woman with a history of stage V chronic kidney disease, diabetes, hypertension, and chronic obstructive pulmonary disease who is admitted with asymptomatic hyperkalemia found on a basic metabolic panel drawn in clinic. The level was 6.7. She was called with the result and asked to present to the emergency department for further evaluation. Repeat potassium was 5.9.  She was admitted to the internal medicine teaching service for further care. She is without acute complaints.  Physical Exam - key components related to admission:  Vitals:   09/18/16 1849 09/18/16 2118 09/19/16 0443 09/19/16 1407  BP: (!) 155/65 (!) 147/61 138/76 (!) 127/54  Pulse: 68 95 77 73  Resp: 20 18 18 20   Temp: 98 F (36.7 C) 98.4 F (36.9 C) 97.7 F (36.5 C) 98.6 F (37 C)  TempSrc: Oral Oral Oral Oral  SpO2: 99% 100% 98% 100%  Weight:      Height:       Gen.: Well-developed, well-nourished, woman sitting comfortably in a chair eating breakfast in no acute distress.  Lab results:  Basic Metabolic Panel:  Recent Labs  09/18/16 0856 09/19/16 0453  NA 139 141  K 5.9* 5.0  CL 113* 112*  CO2 19* 19*  GLUCOSE 193* 207*  BUN 61* 56*  CREATININE 4.19* 4.12*  CALCIUM 9.9 9.5  PHOS  --  4.2   Liver Function Tests:  Recent Labs  09/19/16 0453  ALBUMIN 3.4*   CBC:  Recent Labs  09/17/16 1136 09/18/16 0856  WBC 8.0 8.7  HGB 7.4* 7.9*  HCT 24.7* 26.5*  MCV 77* 77.7*  PLT 226 201   CBG:  Recent Labs  09/17/16 1146 09/18/16 1728 09/18/16 1803  09/18/16 2115 09/19/16 0828 09/19/16 1212  GLUCAP 181* 156* 150* 194* 150* 404*   Hemoglobin A1C:  Recent Labs  09/17/16 1154  HGBA1C 7.0   Anemia Panel:  Recent Labs  09/17/16 1136  FERRITIN 939*  TIBC 196*  IRON 57   Urinalysis:  Hazy, yellow, specific gravity 1.006, pH 5.0, protein 30, nitrite negative, leukocytes negative, red blood cells 0-5 per high-power field, white blood cells 6-30 per high-power field.  Misc. Labs:  PTH 538 Vitamin D 25-hydroxy 19.2 Urine creatinine 46.58 Urine total protein 34 Protein creatinine ratio 0.73  Imaging results:   Chest x-ray: Personally reviewed. No effusions, infiltrates, or masses.  Other results:  EKG (09/18/16): Personally reviewed. Normal sinus rhythm at 70 bpm, left axis deviation, normal intervals, no significant Q waves, no LVH by voltage, delayed R wave progression, no ST or T-wave changes. There are no comparisons immediately available.  EKG (09/19/16): Personally reviewed. Normal sinus rhythm at 67 bpm, normal axis, normal intervals, no significant Q waves, no LVH by voltage, good R wave progression, no ST or T-wave changes. There are no significant change from the previous ECG on 09/18/2016.  Assessment & Plan by Problem:  Joan Banks is a 71 year old woman with a history of stage V chronic kidney disease, diabetes, hypertension, and chronic obstructive pulmonary disease who is admitted with asymptomatic hyperkalemia found on a basic metabolic panel drawn in clinic.  In the emergency department her potassium was found to be 5.9. She was given Kayexalate and other measures were taken to lower her potassium and the following morning it was 5.0. The patient remained asymptomatic throughout her hospitalization.  1) Hyperkalemia: Resolved with kayexalate and other measures. She was given a low potassium diet information sheet and asked to follow the low potassium diet.  2) Stage V chronic renal failure: Vein mapping was  completed in the hospital. She will be scheduled to see vascular surgery in the outpatient setting to set up an appointment for fistula formation. She will also be reestablished in the [facility] with Dr. Posey Pronto. As she has no acute indications for hemodialysis and is completely asymptomatic she was discharged home to complete preparation for the eventual initiation of hemodialysis as an outpatient.  3) Disposition: She will be discharged home today with follow-up as noted above.

## 2016-09-19 NOTE — Plan of Care (Signed)
Problem: Food- and Nutrition-Related Knowledge Deficit (NB-1.1) Goal: Nutrition education Formal process to instruct or train a patient/client in a skill or to impart knowledge to help patients/clients voluntarily manage or modify food choices and eating behavior to maintain or improve health. Outcome: Completed/Met Date Met: 09/19/16 Nutrition Education Note  RD consulted for Renal Education. Provided "Eating Healthy with Kidney Disease" handout. Reviewed food groups and provided written recommended serving sizes specifically determined for patient's current nutritional status.   Explained why diet restrictions are needed and provided lists of foods to limit/avoid that are high potassium, sodium, and phosphorus, with emphasis on potassium. Provided specific recommendations on safer alternatives of these foods. Strongly encouraged compliance of this diet. Teach back method used.  Expect good compliance.  Body mass index is 41.45 kg/m. Pt meets criteria for morbid obesity based on current BMI.  Current diet order is renal with 1200 ml fluids. Pt reports having a good appetite with no other difficulties. Labs and medications reviewed. No further nutrition interventions warranted at this time. RD contact information provided. If additional nutrition issues arise, please re-consult RD.  Corrin Parker, MS, RD, LDN Pager # [phone] After hours/ weekend pager # [phone]

## 2016-09-19 NOTE — Progress Notes (Signed)
   Subjective:  Patient seen and examined.  No acute events overnight.  Ate breakfast without issues.  No nausea or vomiting.  Agreeable to going home and having further work up as an outpatient.  Objective:  Vital signs in last 24 hours: Vitals:   09/18/16 1805 09/18/16 1849 09/18/16 2118 09/19/16 0443  BP:  (!) 155/65 (!) 147/61 138/76  Pulse:  68 95 77  Resp:  20 18 18   Temp:  98 F (36.7 C) 98.4 F (36.9 C) 97.7 F (36.5 C)  TempSrc:  Oral Oral Oral  SpO2:  99% 100% 98%  Weight:      Height: 5\' 3"  (1.6 m)      General: sitting up in chair, NAD HEENT: NCAT, EOMI, no scleral icterus Cardiac: RRR Pulm: clear to auscultation bilaterally, moving normal volumes of air Ext: warm and well perfused, no pedal edema Neuro: alert and oriented X3, cranial nerves II-XII grossly intact   Assessment/Plan:  CKD V Hyperkalemia Anemia of CKD HyperPTH Patients potassium has come down to normal limits with interventions overnight.  She is reportedly having good urine output but none recorded.  Renal function stable this morning.  Vein mapping to be completed today and she can follow up with Vascular Surgery outpatient.  Plan to start calcitriol as outpatient per nephrology.  Decrease Lasix to BID.  Follow up with [facility] and nephrology as an outpatient.  HTN Continue home amlodipine 10mg  daily, atenolol 50mg  BID at discharge.  Stop enalapril given worsening renal function and addition of Lasix BID.  Continue her clonidine at prescribed dose.  DM Continue home medications at discharge.  Dispo: Anticipated discharge in approximately today.   Jule Ser, DO 09/19/2016, 11:33 AM Pager: [phone]

## 2016-09-19 NOTE — Progress Notes (Signed)
Internal Medicine Attending  Date: 09/19/2016  Patient name: Joan Banks Medical record number: [id number] Date of birth: 10-21-1945 Age: 71 y.o. Gender: female  I saw and evaluated the patient. I reviewed the resident's note by Dr. Juleen China and I agree with the resident's findings and plans as documented in his progress note.  Please see my H&P dated 09/19/2016 for specifics of my evaluation, assessment, plan from earlier in the day.

## 2016-09-21 ENCOUNTER — Telehealth: Payer: Self-pay | Admitting: Internal Medicine

## 2016-09-21 NOTE — Telephone Encounter (Signed)
Patient would like a call back about what meds to take after her hospitalization .

## 2016-09-21 NOTE — Telephone Encounter (Signed)
Called pt, she stated she couldn't get one of her meds that she was prescribed at disch from [place], called [facility] it is the aranesp, it is $1800.00+ , that is just the store's cost. Please advise

## 2016-09-21 NOTE — Progress Notes (Signed)
Received call from patient states Aranesp needs a prior auth. Contacted [facility] and spoke to pharmacist. Provided fax number for Dr Johny Chess. He will fax to office to have physician complete. NCM notified patient to follow up with PCP office on tomorrow. Jonnie Finner RN CCM Case Mgmt phone [phone]

## 2016-09-22 NOTE — Telephone Encounter (Signed)
She should contact Nephrology (Dr. Posey Pronto) regarding this medication.  They started it during her recent admission and it is a SQ injection every week.  I am unsure if it is something she would go to their office to receive or not but they should be better able to determine that.

## 2016-09-22 NOTE — Telephone Encounter (Signed)
Pt would like a call back about meds. Please call pt back.

## 2016-09-22 NOTE — Telephone Encounter (Signed)
Called pt, explained need to call [facility], gave her the phone # and spelled the name of the medicine, gave her the info from the pharmacy that cost would be at least $1800.00 and ask her to explain that she could not give herself the shot without some assistance. She will call back if she needs additional advocacy

## 2016-09-23 NOTE — Telephone Encounter (Signed)
Spoke to pt, she states that dr patel's office stated he had nothing to do with her getting these injections, she didn't know what to do, triage will call dr patel's office tomorrow and find out what to do

## 2016-09-23 NOTE — Telephone Encounter (Signed)
Pt is calling back about meds. Please call back.

## 2016-09-23 NOTE — Progress Notes (Signed)
Internal Medicine Clinic Attending  Case discussed with Dr. Amin at the time of the visit.  We reviewed the resident's history and exam and pertinent patient test results.  I agree with the assessment, diagnosis, and plan of care documented in the resident's note.    

## 2016-09-23 NOTE — Telephone Encounter (Signed)
Thank you for talking to her.  I am unsure of how nephrology usually manages Aranesp injections but it was started by Dr. Mercy Moore during her last admission.  Either way, she definitely needs to be seen by Dr. Posey Pronto in the office shortly as she is near ESRD and has not been seen in several years.  She was DC'ed over the weekend and her instructions told her to call [facility] and schedule a hospital follow up appointment.  Thanks.

## 2016-09-24 ENCOUNTER — Other Ambulatory Visit: Payer: Self-pay | Admitting: *Deleted

## 2016-09-25 ENCOUNTER — Encounter: Payer: Self-pay | Admitting: Internal Medicine

## 2016-09-25 ENCOUNTER — Ambulatory Visit (INDEPENDENT_AMBULATORY_CARE_PROVIDER_SITE_OTHER): Payer: Medicare Other | Admitting: Internal Medicine

## 2016-09-25 VITALS — BP 155/69 | HR 79 | Temp 98.2°F | Ht 64.0 in | Wt 231.0 lb

## 2016-09-25 DIAGNOSIS — I120 Hypertensive chronic kidney disease with stage 5 chronic kidney disease or end stage renal disease: Secondary | ICD-10-CM

## 2016-09-25 DIAGNOSIS — E1122 Type 2 diabetes mellitus with diabetic chronic kidney disease: Secondary | ICD-10-CM

## 2016-09-25 DIAGNOSIS — Z87891 Personal history of nicotine dependence: Secondary | ICD-10-CM | POA: Diagnosis not present

## 2016-09-25 DIAGNOSIS — Z79899 Other long term (current) drug therapy: Secondary | ICD-10-CM

## 2016-09-25 DIAGNOSIS — N185 Chronic kidney disease, stage 5: Secondary | ICD-10-CM | POA: Diagnosis not present

## 2016-09-25 DIAGNOSIS — E1159 Type 2 diabetes mellitus with other circulatory complications: Secondary | ICD-10-CM

## 2016-09-25 DIAGNOSIS — I10 Essential (primary) hypertension: Secondary | ICD-10-CM

## 2016-09-25 MED ORDER — FERROUS SULFATE 325 (65 FE) MG PO TABS: 325.0000 mg | ORAL_TABLET | Freq: Two times a day (BID) | ORAL | 2 refills | Status: DC

## 2016-09-25 MED ORDER — SODIUM POLYSTYRENE SULFONATE PO POWD: Freq: Once | ORAL | 0 refills | Status: AC

## 2016-09-25 NOTE — Assessment & Plan Note (Signed)
Doing well since discharge. Continue to avoid high potassium containing foods. Provided with a list of foods to avoid. She denies any symptoms consistent with uremia. Appointment with Dr. Posey Pronto on 9/10.   Plan: - Vascular surgery 8/30 for fistula formation - Follow-up with Nephrology on 9/10  - BMP today, Kayexalate prescribed in case she is hyperkalemic

## 2016-09-25 NOTE — Assessment & Plan Note (Signed)
Uncontrolled. She continues to take her clonidine, amlodipine, and atenolol as prescribed. She was previously on enalapril but was taken off of it because of hyperkalemia. Denies HA, visual changes, chest pain, shortness of breath.   Plan: - Continue Amlodipine 10 mg, Clonidine 0.3 mg TID, and Atenolol 50 mg BID  - May need to add Hydralazine in the future. Will wait to modify medications until she has seen Nephrology on 09/10

## 2016-09-25 NOTE — Progress Notes (Signed)
CC: Follow-up after recent hospitalization   HPI:  Joan Banks is a 71 y.o. wityh a PMHx significant for CKD stage V who was admitted to the hospital on 8/17 for hyperkalemia and subsequently discharged on 8/18 in stable condition with instructions to follow-up with nephrology and vascular surgery to prepare for HD.   CKD Stage V. Vascular surgery scheduled 8/30 for fistula formation. Haven't seen nephrology since discharge. Plan to see nephrology after the surgery. Trouble getting the Aranesp due to cost. Dr. Mercy Moore prescribed it while in the hospital on 8/17. Otherwise has been feeling well. Watching her diet trying to avoid grapefruit, bananas, tomatoes, and other potassium containing foods. Avoiding sodas. Does drink Gatorade. Does take oral iron. Denies fatigue, diffuse itching, cold intolerance, hair loss, skin changes, insomnia. Acknowledges some constipation  HTN. Taking clonidine 0.3 mg TID, Amlodipine 10 mg daily, and Atenolol 50 mg BID. Does not check her BP at home. Restricts her salt intake. Denies HA, chest pain, palpitations, SOB at rest, orthopnea. Acknowledges swelling in here LE.   Past Medical History:  Diagnosis Date  . Anemia, iron deficiency   . 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 Eston Esters.  . Chronic renal insufficiency   . COPD (chronic obstructive pulmonary disease) ([place])   . Dental caries   . Diabetes mellitus   . Diabetic peripheral neuropathy ([place])   . Gastritis and duodenitis 1996   H. pylori positive  . Hyperlipidemia   . Hypertension   . Low back pain   . OSA (obstructive sleep apnea)    Sleep study 12/23/2003 showed very severe obstructive sleep apnea/hypopnea syndrome, RDI 136 per hour, with severe oxygen desaturation to 50% on room air before CPAP.  Marland Kitchen Uterine leiomyoma    Hx of massive leiomyomata uteri;  S/P total abdominal hysterectomy, right salpingo-oophorectomy, and debulking of benign fibroid from vesicouterine space 11/18/99   . Visual impairment    Review of Systems:  Review of Systems  Constitutional: Negative for malaise/fatigue and weight loss.  Eyes: Negative for blurred vision and pain.  Respiratory: Negative for cough, hemoptysis and sputum production.   Cardiovascular: Positive for leg swelling. Negative for chest pain, palpitations and orthopnea.  Gastrointestinal: Positive for constipation. Negative for abdominal pain, diarrhea, nausea and vomiting.  Genitourinary: Negative for dysuria, frequency and urgency.  Musculoskeletal: Positive for joint pain (Knees). Negative for myalgias.  Skin: Negative for itching and rash.  Neurological: Negative for dizziness, tingling, weakness and headaches.   Physical Exam: Vitals:   09/25/16 1323  BP: (!) 155/69  Pulse: 79  Temp: 98.2 F (36.8 C)  TempSrc: Oral  SpO2: 90%  Weight: 231 lb (104.8 kg)  Height: 5\' 4"  (1.626 m)   Physical Exam  Constitutional: She is oriented to person, place, and time. She appears well-developed and well-nourished.  HENT:  Head: Normocephalic and atraumatic.  Cardiovascular: Normal rate and intact distal pulses.   Systolic murmur with prominent S2  Pulmonary/Chest: Effort normal and breath sounds normal. No respiratory distress. She has no wheezes.  Abdominal: Soft. Bowel sounds are normal. She exhibits no distension. There is no tenderness.  Musculoskeletal: She exhibits edema (trace pitting LE bilaterally).  Neurological: She is alert and oriented to person, place, and time.  Skin: Skin is warm and dry. No rash noted.   Assessment & Plan:   See Encounters Tab for  problem based charting.  Patient seen with Dr. Angelia Mould

## 2016-09-25 NOTE — Patient Instructions (Addendum)
It was a pleasure to meet you today.   - Please continue to monitor your diet and avoid high potassium foods.   - Please follow up with your kidney doctor  - Please continue to take all your medications   - Please come back to see Dr. Johny Chess in 2 month  Dialysis Diet Dialysis is a treatment that cleans your blood. It is used when the kidneys are damaged. When you need dialysis, you should watch your diet. This is because some nutrients can build up in your blood between treatments and make you sick. These nutrients are:  Potassium.  Phosphorus.  Sodium.  Your doctor or dietitian will:  Tell you how much of these you can have.  Tell you if you need to look out for other nutrients too.  Help you plan meals.  Tell you how much to drink each day.  What do I need to know about this diet?  Limit potassium. Potassium is in milk, fruits, and vegetables.  Limit phosphorus. Phosphorus is in milk, cheese, beans, nuts, and carbonated beverages.  Limit salt (sodium). Foods that have a lot sodium include processed and cured meats, ready-made frozen meals, canned vegetables, and salty snack foods.  Do not use salt substitutes.  Try not to eat whole-grain foods and foods that have a lot of fiber.  Follow your doctor's instructions about how much to drink. You may be told to: ? Write down what you drink. ? Write down foods you eat that are made mostly from water, such as gelatin and soups. ? Drink from small cups.  Ask your doctor if you should take a medicine that binds phosphorus.  Take vitamin and mineral supplements only as told by your doctor.  Eat meat, poultry, fish, and eggs. Limit nuts and beans.  Before you cook potatoes, cut them into small pieces. Then boil them in unsalted water.  Drain all fluid from cooked vegetables and canned fruits before you eat them. What foods can I eat? Grains White bread. White rice. Cooked cereal. Unsalted popcorn. Tortillas.  Pasta. Vegetables Fresh or frozen broccoli, carrots, and green beans. Cabbage. Cauliflower. Celery. Cucumbers. Eggplant. Radishes. Zucchini. Fruits Apples. Fresh or frozen berries. Fresh or canned pears, peaches, and pineapple. Grapes. Plums. Meats and Other Protein Sources Fresh or frozen beef, pork, chicken, and fish. Eggs. Low-sodium canned tuna or salmon. Dairy Cream cheese. Heavy cream. Ricotta cheese. Beverages Apple cider. Cranberry juice. Grape juice. Lemonade. Black coffee. Condiments Herbs. Spices. Jam and jelly. Honey. Sweets and Desserts Sherbet. Cakes. Cookies. Fats and Oils Olive oil, canola oil, and safflower oil. Other Non-dairy creamer. Non-dairy whipped topping. Homemade broth without salt. The items listed above may not be a complete list of recommended foods or beverages. Contact your dietitian for more options. What foods are not recommended? Grains Whole-grain bread. Whole-grain pasta. High-fiber cereal. Vegetables Potatoes. Beets. Tomatoes. Winter squash and pumpkin. Asparagus. Spinach. Parsnips. Fruits Star fruit. Bananas. Oranges. Kiwi. Nectarines. Prunes. Melon. Dried fruit. Avocado. Meats and Other Protein Sources Canned, smoked, and cured meats. Soil scientist. Sardines. Nuts and seeds. Peanut butter. Beans and legumes. Dairy Milk. Buttermilk. Yogurt. Cheese and cottage cheese. Processed cheese spreads. Beverages Orange juice. Prune juice. Carbonated soft drinks. Condiments Salt. Salt substitutes. Soy sauce. Sweets and Desserts Ice cream. Chocolate. Candied nuts. Fats and Oils Butter. Margarine. Other Ready-made frozen meals. Canned soups. The items listed above may not be a complete list of foods and beverages to avoid. Contact your dietitian for more information. This  information is not intended to replace advice given to you by your health care provider. Make sure you discuss any questions you have with your health care  provider. Document Released: 07/21/2011 Document Revised: 06/27/2015 Document Reviewed: 08/22/2013 Elsevier Interactive Patient Education  [employer].

## 2016-09-26 LAB — BMP8+ANION GAP
ANION GAP: 21 mmol/L — AB (ref 10.0–18.0)
BUN/Creatinine Ratio: 14 (ref 12–28)
BUN: 63 mg/dL — AB (ref 8–27)
CO2: 17 mmol/L — AB (ref 20–29)
CREATININE: 4.47 mg/dL — AB (ref 0.57–1.00)
Calcium: 9.3 mg/dL (ref 8.7–10.3)
Chloride: 109 mmol/L — ABNORMAL HIGH (ref 96–106)
GFR calc Af Amer: 11 mL/min/{1.73_m2} — ABNORMAL LOW (ref >59)
GFR, EST NON AFRICAN AMERICAN: 9 mL/min/{1.73_m2} — AB (ref >59)
Glucose: 160 mg/dL — ABNORMAL HIGH (ref 65–99)
Potassium: 4.7 mmol/L (ref 3.5–5.2)
Sodium: 147 mmol/L — ABNORMAL HIGH (ref 134–144)

## 2016-09-27 NOTE — Progress Notes (Signed)
Internal Medicine Clinic Attending  I saw and evaluated the patient.  I personally confirmed the key portions of the history and exam documented by Dr. Helberg and I reviewed pertinent patient test results.  The assessment, diagnosis, and plan were formulated together and I agree with the documentation in the resident's note. 

## 2016-09-28 DIAGNOSIS — G4733 Obstructive sleep apnea (adult) (pediatric): Secondary | ICD-10-CM | POA: Diagnosis not present

## 2016-09-28 NOTE — Telephone Encounter (Signed)
Hi Helen, sorry for the delay in responding, Aranesp would likely need an order for patient to get through short stay

## 2016-09-28 NOTE — Telephone Encounter (Signed)
I will call patient

## 2016-09-28 NOTE — Telephone Encounter (Signed)
Spoke to patient and was able to get the Aranesp covered by insurance, she reported her copay was around $1

## 2016-09-29 ENCOUNTER — Encounter (HOSPITAL_COMMUNITY): Payer: Self-pay | Admitting: *Deleted

## 2016-09-29 NOTE — Progress Notes (Signed)
Pt denies any acute cardiopulmonary issues. Pt denies being under the care of a cardiologist. Pt denies having a stress test and cardiac cath. Pt made aware to stop taking vitamins, fish oil, and herbal medications. Do not take any NSAIDs ie: Ibuprofen, Advil, Naproxen (Aleve), Motrin, BC and Goody Powder. Pt made aware to take 25 units of Lantus at HS the night before surgery and No Januvia DOS. Pt made aware to check BG every 2 hours prior to arrival to hospital on DOS. Pt made aware to treat a BG < 70 with 4 ounces of apple or cranberry juice, wait 15 minutes after intervention to recheck BG, if BG remains < 70, call Short Stay unit to speak with a nurse. Pt verbalized understanding of all pre-op instructions. Anesthesia asked to review chest x ray from recent admission.

## 2016-09-30 NOTE — Progress Notes (Signed)
Anesthesia Chart Review:  Pt is a same day work up.   Pt is a 71 year old female scheduled for L arm AV fistula creation on 10/01/2016 with Harold Barban, MD  PMH includes:  CHF, HTN, DM, hyperlipidemia, COPD, OSA, CKD (stage 5), GERD. Former smoker. BMI 40.  - Hospitalized 8/17-18/18 for hyperkalemia, acute on chronic renal failure, excess volume due to renal disease  Medications include: Amlodipine, ASA 81 mg, atenolol, clonidine, iron, Lasix, Lantus, Combivent, Prilosec, simvastatin, sitagliptin  Labs will be obtained DOS.   CXR 09/18/16:  1. Cardiomegaly, cephalization of the pulmonary vasculature and borderline mild pulmonary edema, suggesting mild congestive heart failure. 2. Patchy right lung base opacity, which could represent atelectasis, aspiration or pneumonia. Recommend follow-up PA and lateral post treatment chest radiographs in 4-6 weeks. - Done in the setting of hospital admission for hyperkalemia.  Notes by internal medicine teaching service indicate pulmonary edema suspected; tx with lasix.   EKG 09/19/16: NSR  Echo 09/19/16:  - Left ventricle: The cavity size was normal. Wall thickness was increased in a pattern of mild LVH. Systolic function was normal. The estimated ejection fraction was in the range of 60% to 65%. Wall motion was normal; there were no regional wall motion abnormalities. Doppler parameters are consistent with abnormal left ventricular relaxation (grade 1 diastolic dysfunction). - Mitral valve: There was trivial regurgitation. - Left atrium: The atrium was mildly dilated. - Right atrium: Central venous pressure (est): 3 mm Hg. - Atrial septum: No defect or patent foramen ovale was identified. - Tricuspid valve: There was trivial regurgitation. - Pulmonary arteries: Systolic pressure could not be accurately estimated. - Pericardium, extracardiac: A small pericardial effusion was identified circumferential to the heart.  If no changes, I anticipate pt can  proceed with surgery as scheduled.   Willeen Cass, FNP-BC [facility]/Anesthesiology Phone: [phone] 09/30/2016 11:33 AM

## 2016-10-01 ENCOUNTER — Encounter (HOSPITAL_COMMUNITY)
Admission: RE | Disposition: A | Discharge status: Home / Self Care | Payer: Self-pay | Source: Ambulatory Visit | Attending: Surgery

## 2016-10-01 ENCOUNTER — Ambulatory Visit (HOSPITAL_COMMUNITY)
Admission: RE | Admit: 2016-10-01 | Discharge: 2016-10-01 | Disposition: A | Discharge status: Home / Self Care | Payer: Medicare Other | Source: Ambulatory Visit | Attending: Surgery | Admitting: Surgery

## 2016-10-01 ENCOUNTER — Encounter (HOSPITAL_COMMUNITY): Payer: Self-pay | Admitting: *Deleted

## 2016-10-01 ENCOUNTER — Ambulatory Visit (HOSPITAL_COMMUNITY): Payer: Medicare Other | Admitting: Emergency Medicine

## 2016-10-01 DIAGNOSIS — E1122 Type 2 diabetes mellitus with diabetic chronic kidney disease: Secondary | ICD-10-CM | POA: Diagnosis not present

## 2016-10-01 DIAGNOSIS — G4733 Obstructive sleep apnea (adult) (pediatric): Secondary | ICD-10-CM | POA: Insufficient documentation

## 2016-10-01 DIAGNOSIS — Z794 Long term (current) use of insulin: Secondary | ICD-10-CM | POA: Insufficient documentation

## 2016-10-01 DIAGNOSIS — N185 Chronic kidney disease, stage 5: Secondary | ICD-10-CM | POA: Diagnosis not present

## 2016-10-01 DIAGNOSIS — Z87891 Personal history of nicotine dependence: Secondary | ICD-10-CM | POA: Diagnosis not present

## 2016-10-01 DIAGNOSIS — E1142 Type 2 diabetes mellitus with diabetic polyneuropathy: Secondary | ICD-10-CM | POA: Insufficient documentation

## 2016-10-01 DIAGNOSIS — G4730 Sleep apnea, unspecified: Secondary | ICD-10-CM

## 2016-10-01 DIAGNOSIS — E875 Hyperkalemia: Secondary | ICD-10-CM | POA: Diagnosis not present

## 2016-10-01 DIAGNOSIS — I10 Essential (primary) hypertension: Secondary | ICD-10-CM

## 2016-10-01 DIAGNOSIS — E1159 Type 2 diabetes mellitus with other circulatory complications: Secondary | ICD-10-CM

## 2016-10-01 DIAGNOSIS — E785 Hyperlipidemia, unspecified: Secondary | ICD-10-CM | POA: Insufficient documentation

## 2016-10-01 DIAGNOSIS — Z79899 Other long term (current) drug therapy: Secondary | ICD-10-CM | POA: Diagnosis not present

## 2016-10-01 DIAGNOSIS — Z7982 Long term (current) use of aspirin: Secondary | ICD-10-CM | POA: Insufficient documentation

## 2016-10-01 DIAGNOSIS — I120 Hypertensive chronic kidney disease with stage 5 chronic kidney disease or end stage renal disease: Secondary | ICD-10-CM | POA: Diagnosis not present

## 2016-10-01 DIAGNOSIS — J449 Chronic obstructive pulmonary disease, unspecified: Secondary | ICD-10-CM | POA: Insufficient documentation

## 2016-10-01 HISTORY — PX: AV FISTULA PLACEMENT: SHX1204

## 2016-10-01 HISTORY — DX: Cardiac murmur, unspecified: R01.1

## 2016-10-01 HISTORY — DX: Gastro-esophageal reflux disease without esophagitis: K21.9

## 2016-10-01 HISTORY — DX: Heart failure, unspecified: I50.9

## 2016-10-01 HISTORY — DX: Unspecified osteoarthritis, unspecified site: M19.90

## 2016-10-01 LAB — BASIC METABOLIC PANEL
ANION GAP: 10 (ref 5–15)
BUN: 83 mg/dL — AB (ref 6–20)
CO2: 21 mmol/L — AB (ref 22–32)
Calcium: 9.3 mg/dL (ref 8.9–10.3)
Chloride: 107 mmol/L (ref 101–111)
Creatinine, Ser: 4.74 mg/dL — ABNORMAL HIGH (ref 0.44–1.00)
GFR calc Af Amer: 10 mL/min — ABNORMAL LOW (ref >60)
GFR calc non Af Amer: 8 mL/min — ABNORMAL LOW (ref >60)
GLUCOSE: 280 mg/dL — AB (ref 65–99)
Potassium: 5.3 mmol/L — ABNORMAL HIGH (ref 3.5–5.1)
Sodium: 138 mmol/L (ref 135–145)

## 2016-10-01 LAB — POCT I-STAT 4, (NA,K, GLUC, HGB,HCT)
Glucose, Bld: 272 mg/dL — ABNORMAL HIGH (ref 65–99)
HCT: 24 % — ABNORMAL LOW (ref 36.0–46.0)
Hemoglobin: 8.2 g/dL — ABNORMAL LOW (ref 12.0–15.0)
Potassium: 5.1 mmol/L (ref 3.5–5.1)
SODIUM: 139 mmol/L (ref 135–145)

## 2016-10-01 LAB — GLUCOSE, CAPILLARY: GLUCOSE-CAPILLARY: 257 mg/dL — AB (ref 65–99)

## 2016-10-01 SURGERY — ARTERIOVENOUS (AV) FISTULA CREATION
Anesthesia: General | Site: Arm Lower | Laterality: Left

## 2016-10-01 MED ORDER — OXYCODONE-ACETAMINOPHEN 5-325 MG PO TABS: 1.0000 | ORAL_TABLET | Freq: Four times a day (QID) | ORAL | 0 refills | Status: AC | PRN

## 2016-10-01 MED ORDER — EPHEDRINE SULFATE 50 MG/ML IJ SOLN
INTRAMUSCULAR | Status: DC | PRN
  Administered 2016-10-01: 10 mg via INTRAVENOUS
  Administered 2016-10-01: 5 mg via INTRAVENOUS

## 2016-10-01 MED ORDER — OXYCODONE HCL 5 MG/5ML PO SOLN: 5.0000 mg | Freq: Once | ORAL | Status: DC | PRN

## 2016-10-01 MED ORDER — CHLORHEXIDINE GLUCONATE 4 % EX LIQD: 60.0000 mL | Freq: Once | CUTANEOUS | Status: DC

## 2016-10-01 MED ORDER — MIDAZOLAM HCL 2 MG/2ML IJ SOLN
INTRAMUSCULAR | Status: AC
  Filled 2016-10-01: qty 2

## 2016-10-01 MED ORDER — ONDANSETRON HCL 4 MG/2ML IJ SOLN
INTRAMUSCULAR | Status: DC | PRN
  Administered 2016-10-01: 4 mg via INTRAVENOUS

## 2016-10-01 MED ORDER — LIDOCAINE-EPINEPHRINE (PF) 1 %-1:200000 IJ SOLN
INTRAMUSCULAR | Status: AC
  Filled 2016-10-01: qty 30

## 2016-10-01 MED ORDER — LIDOCAINE 2% (20 MG/ML) 5 ML SYRINGE
INTRAMUSCULAR | Status: AC
  Filled 2016-10-01: qty 5

## 2016-10-01 MED ORDER — PHENYLEPHRINE HCL 10 MG/ML IJ SOLN
INTRAMUSCULAR | Status: DC | PRN
  Administered 2016-10-01 (×3): 80 ug via INTRAVENOUS

## 2016-10-01 MED ORDER — ONDANSETRON HCL 4 MG/2ML IJ SOLN: 4.0000 mg | Freq: Four times a day (QID) | INTRAMUSCULAR | Status: DC | PRN

## 2016-10-01 MED ORDER — FENTANYL CITRATE (PF) 100 MCG/2ML IJ SOLN
INTRAMUSCULAR | Status: DC | PRN
  Administered 2016-10-01 (×3): 25 ug via INTRAVENOUS

## 2016-10-01 MED ORDER — PROTAMINE SULFATE 10 MG/ML IV SOLN
INTRAVENOUS | Status: AC
  Filled 2016-10-01: qty 5

## 2016-10-01 MED ORDER — SODIUM CHLORIDE 0.9 % IV SOLN
INTRAVENOUS | Status: DC | PRN
  Administered 2016-10-01: 500 mL

## 2016-10-01 MED ORDER — FUROSEMIDE 80 MG PO TABS: 160.0000 mg | ORAL_TABLET | Freq: Every day | ORAL | Status: AC

## 2016-10-01 MED ORDER — FENTANYL CITRATE (PF) 100 MCG/2ML IJ SOLN: 25.0000 ug | INTRAMUSCULAR | Status: DC | PRN

## 2016-10-01 MED ORDER — SODIUM CHLORIDE 0.9 % IV SOLN
INTRAVENOUS | Status: DC
  Administered 2016-10-01 (×3): via INTRAVENOUS

## 2016-10-01 MED ORDER — PROTAMINE SULFATE 10 MG/ML IV SOLN
INTRAVENOUS | Status: DC | PRN
  Administered 2016-10-01: 25 mg via INTRAVENOUS

## 2016-10-01 MED ORDER — PHENYLEPHRINE HCL 10 MG/ML IJ SOLN
INTRAMUSCULAR | Status: AC
  Filled 2016-10-01: qty 1

## 2016-10-01 MED ORDER — PHENYLEPHRINE HCL 10 MG/ML IJ SOLN
INTRAVENOUS | Status: DC | PRN
  Administered 2016-10-01: 40 ug/min via INTRAVENOUS

## 2016-10-01 MED ORDER — FENTANYL CITRATE (PF) 250 MCG/5ML IJ SOLN
INTRAMUSCULAR | Status: AC
  Filled 2016-10-01: qty 5

## 2016-10-01 MED ORDER — ATENOLOL 50 MG PO TABS: 100.0000 mg | ORAL_TABLET | Freq: Every day | ORAL | Status: AC

## 2016-10-01 MED ORDER — FERROUS SULFATE 325 (65 FE) MG PO TABS: 325.0000 mg | ORAL_TABLET | Freq: Every day | ORAL | Status: AC

## 2016-10-01 MED ORDER — EPHEDRINE 5 MG/ML INJ
INTRAVENOUS | Status: AC
  Filled 2016-10-01: qty 10

## 2016-10-01 MED ORDER — PROPOFOL 10 MG/ML IV BOLUS
INTRAVENOUS | Status: DC | PRN
  Administered 2016-10-01: 150 mg via INTRAVENOUS

## 2016-10-01 MED ORDER — OXYCODONE HCL 5 MG PO TABS: 5.0000 mg | ORAL_TABLET | Freq: Once | ORAL | Status: DC | PRN

## 2016-10-01 MED ORDER — DEXTROSE 5 % IV SOLN
1.5000 g | INTRAVENOUS | Status: AC
  Administered 2016-10-01: 1.5 g via INTRAVENOUS

## 2016-10-01 MED ORDER — IPRATROPIUM-ALBUTEROL 20-100 MCG/ACT IN AERS: 1.0000 | INHALATION_SPRAY | Freq: Four times a day (QID) | RESPIRATORY_TRACT | Status: AC | PRN

## 2016-10-01 MED ORDER — INSULIN GLARGINE 100 UNIT/ML SOLOSTAR PEN: 50.0000 [IU] | PEN_INJECTOR | Freq: Every day | SUBCUTANEOUS | Status: AC

## 2016-10-01 MED ORDER — LIDOCAINE 2% (20 MG/ML) 5 ML SYRINGE
INTRAMUSCULAR | Status: DC | PRN
  Administered 2016-10-01: 60 mg via INTRAVENOUS

## 2016-10-01 MED ORDER — 0.9 % SODIUM CHLORIDE (POUR BTL) OPTIME
TOPICAL | Status: DC | PRN
  Administered 2016-10-01: 1000 mL

## 2016-10-01 MED ORDER — CEFUROXIME SODIUM 1.5 G IV SOLR
INTRAVENOUS | Status: AC
  Filled 2016-10-01: qty 1.5

## 2016-10-01 MED ORDER — PHENYLEPHRINE 40 MCG/ML (10ML) SYRINGE FOR IV PUSH (FOR BLOOD PRESSURE SUPPORT)
PREFILLED_SYRINGE | INTRAVENOUS | Status: AC
  Filled 2016-10-01: qty 10

## 2016-10-01 MED ORDER — HEMOSTATIC AGENTS (NO CHARGE) OPTIME
TOPICAL | Status: DC | PRN
  Administered 2016-10-01: 1 via TOPICAL

## 2016-10-01 MED ORDER — HEPARIN SODIUM (PORCINE) 1000 UNIT/ML IJ SOLN
INTRAMUSCULAR | Status: DC | PRN
  Administered 2016-10-01: 3000 [IU] via INTRAVENOUS

## 2016-10-01 SURGICAL SUPPLY — 37 items
ADH SKN CLS APL DERMABOND .7 (GAUZE/BANDAGES/DRESSINGS) ×2
ARMBAND PINK RESTRICT EXTREMIT (MISCELLANEOUS) ×8 IMPLANT
CANISTER SUCT 3000ML PPV (MISCELLANEOUS) ×4 IMPLANT
CLIP TI WIDE RED SMALL 6 (CLIP) ×3 IMPLANT
CLIP VESOCCLUDE MED 6/CT (CLIP) ×4 IMPLANT
CLIP VESOCCLUDE SM WIDE 6/CT (CLIP) ×4 IMPLANT
COVER PROBE W GEL 5X96 (DRAPES) ×4 IMPLANT
DERMABOND ADVANCED (GAUZE/BANDAGES/DRESSINGS) ×2
DERMABOND ADVANCED .7 DNX12 (GAUZE/BANDAGES/DRESSINGS) ×2 IMPLANT
ELECT REM PT RETURN 9FT ADLT (ELECTROSURGICAL) ×4
ELECTRODE REM PT RTRN 9FT ADLT (ELECTROSURGICAL) ×2 IMPLANT
GLOVE BIO SURGEON STRL SZ 6.5 (GLOVE) ×6 IMPLANT
GLOVE BIO SURGEON STRL SZ8 (GLOVE) ×3 IMPLANT
GLOVE BIO SURGEONS STRL SZ 6.5 (GLOVE) ×3
GLOVE BIOGEL PI IND STRL 6.5 (GLOVE) ×2 IMPLANT
GLOVE BIOGEL PI IND STRL 7.5 (GLOVE) ×2 IMPLANT
GLOVE BIOGEL PI INDICATOR 6.5 (GLOVE) ×4
GLOVE BIOGEL PI INDICATOR 7.5 (GLOVE) ×2
GLOVE SURG SS PI 7.5 STRL IVOR (GLOVE) ×4 IMPLANT
GOWN STRL REUS W/ TWL LRG LVL3 (GOWN DISPOSABLE) ×4 IMPLANT
GOWN STRL REUS W/ TWL XL LVL3 (GOWN DISPOSABLE) ×2 IMPLANT
GOWN STRL REUS W/TWL LRG LVL3 (GOWN DISPOSABLE) ×8
GOWN STRL REUS W/TWL XL LVL3 (GOWN DISPOSABLE) ×4
HEMOSTAT SNOW SURGICEL 2X4 (HEMOSTASIS) IMPLANT
KIT BASIN OR (CUSTOM PROCEDURE TRAY) ×4 IMPLANT
KIT ROOM TURNOVER OR (KITS) ×4 IMPLANT
NS IRRIG 1000ML POUR BTL (IV SOLUTION) ×4 IMPLANT
PACK CV ACCESS (CUSTOM PROCEDURE TRAY) ×4 IMPLANT
PAD ARMBOARD 7.5X6 YLW CONV (MISCELLANEOUS) ×8 IMPLANT
SUT PROLENE 6 0 BV (SUTURE) ×5 IMPLANT
SUT PROLENE 6 0 CC (SUTURE) ×7 IMPLANT
SUT VIC AB 3-0 SH 27 (SUTURE) ×8
SUT VIC AB 3-0 SH 27X BRD (SUTURE) ×4 IMPLANT
SUT VICRYL 4-0 PS2 18IN ABS (SUTURE) ×4 IMPLANT
SYR TOOMEY 50ML (SYRINGE) IMPLANT
UNDERPAD 30X30 (UNDERPADS AND DIAPERS) ×4 IMPLANT
WATER STERILE IRR 1000ML POUR (IV SOLUTION) ×4 IMPLANT

## 2016-10-01 NOTE — H&P (Signed)
[facility]  Patient name: Joan Banks   MRN: [id number]        DOB: 09/05/1945        Sex: female  REASON FOR CONSULT: permanent dialysis access, consult is from Dr. Mercy Moore  HPI:   Tyjai P Gauna is a 71 y.o. 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 She is not yet on hemodialysis. The patient is right-handed. She's never required temporary dialysis. She's never had access procedures before.  The patient presented to the hospital today secondary to abnormal labs including a potassium of 6.7. Shortness of breath. She does note some swelling in her legs. She also complains of some fatigue. Has seen Dr. Posey Pronto in the past but has not seen him in a while.  Denies history of cardiac issues. Does report occasional chest pain. Reports history of congestive heart failure. Diabetes is managed on insulin and oral hypoglycemics. Her hypertension is managed on multiple agents. Denies history of CVA.  Is a former smoker. Lives at home with her husband.      Past Medical History:  Diagnosis Date  . Anemia, iron deficiency   . 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 Eston Esters.  . Chronic renal insufficiency   . COPD (chronic obstructive pulmonary disease) ([place])   . Dental caries   . Diabetes mellitus   . Diabetic peripheral neuropathy ([place])   . Gastritis and duodenitis 1996   H. pylori positive  . Hyperlipidemia   . Hypertension   . Low back pain   . OSA (obstructive sleep apnea)    Sleep study 12/23/2003 showed very severe obstructive sleep apnea/hypopnea syndrome, RDI 136 per hour, with severe oxygen desaturation to 50% on room air before CPAP.  Marland Kitchen Uterine leiomyoma    Hx of massive leiomyomata uteri; S/P total  abdominal hysterectomy, right salpingo-oophorectomy, and debulking of benign fibroid from vesicouterine space 11/18/99   . Visual impairment          Family History  Problem Relation Age of Onset  . Breast cancer Sister   . Cancer Mother        Marena Chancy of type.     SOCIAL HISTORY:   Social History        Social History  . Marital status: Married    Spouse name: N/A  . Number of children: N/A  . Years of education: N/A      Occupational History  . Not on file.        Social History Main Topics  . Smoking status: Former Smoker    Packs/day: 0.50    Years: 30.00    Types: Cigarettes    Quit date: 02/03/2000  . Smokeless tobacco: Never Used  . Alcohol use No  . Drug use: No  . Sexual activity: Not on file       Other Topics Concern  . Not on file   Social History Narrative  . No narrative on file    No Known Allergies  MEDICATIONS:             Current Facility-Administered Medications  Medication Dose Route Frequency Provider Last Rate Last Dose  . amLODipine (NORVASC) tablet 10 mg  10 mg Oral q morning - 10a Jule Ser, DO   10 mg at  09/18/16 1448  . aspirin EC tablet 81 mg  81 mg Oral Daily Jule Ser, DO   81 mg at 09/18/16 1448  . cloNIDine (CATAPRES) tablet 0.3 mg  0.3 mg Oral BID Jule Ser, DO   0.3 mg at 09/18/16 1531  . Darbepoetin Alfa (ARANESP) injection 100 mcg  100 mcg Subcutaneous Q Fri-1800 Fleet Contras, MD      . dextrose 50 % solution 50 mL  1 ampule Intravenous Once Tawny Asal, MD      . Derrill Memo ON 09/19/2016] ferrous sulfate tablet 325 mg  325 mg Oral Q breakfast Jule Ser, DO      . furosemide (LASIX) tablet 160 mg  160 mg Oral Once Little, Wenda Overland, MD      . furosemide (LASIX) tablet 160 mg  160 mg Oral TID Fleet Contras, MD      . heparin injection 5,000 Units  5,000 Units Subcutaneous Q8H Jule Ser, DO      . insulin aspart (novoLOG) injection 0-5 Units  0-5 Units  Subcutaneous QHS Jule Ser, DO      . insulin aspart (novoLOG) injection 0-9 Units  0-9 Units Subcutaneous TID WC Jule Ser, DO      . insulin aspart (novoLOG) injection 10 Units  10 Units Subcutaneous Once Oval Linsey, MD      . insulin glargine (LANTUS) injection 30 Units  30 Units Subcutaneous QHS Jule Ser, DO      . pantoprazole (PROTONIX) EC tablet 40 mg  40 mg Oral Daily Jule Ser, DO   40 mg at 09/18/16 1448  . simvastatin (ZOCOR) tablet 20 mg  20 mg Oral Daily Jule Ser, DO      . sodium chloride flush (NS) 0.9 % injection 3 mL  3 mL Intravenous Q12H Jule Ser, DO      . sodium polystyrene (KAYEXALATE) 15 GM/60ML suspension 30 g  30 g Oral Once Fleet Contras, MD      . Vitamin D3 CAPS 1,000 Units  1 capsule Oral Daily Jule Ser, DO             Current Outpatient Prescriptions  Medication Sig Dispense Refill  . amLODipine (NORVASC) 10 MG tablet take 1 tablet by mouth every morning (Patient taking differently: Take 10mg  by mouth every morning) 90 tablet 1  . aspirin 81 MG EC tablet Take 1 tablet (81 mg total) by mouth daily. 100 tablet 3  . atenolol (TENORMIN) 50 MG tablet Take 1 tablet (50 mg total) by mouth 2 (two) times daily. (Patient taking differently: Take 100 mg by mouth daily. ) 180 tablet 1  . cloNIDine (CATAPRES) 0.3 MG tablet Take 1 tablet (0.3 mg total) by mouth 3 (three) times daily. (Patient taking differently: Take 0.9 mg by mouth 2 (two) times daily. ) 90 tablet 5  . enalapril (VASOTEC) 20 MG tablet Take 40 mg by mouth daily.  0  . ferrous sulfate 325 (65 FE) MG tablet Take 1 tablet (325 mg total) by mouth 2 (two) times daily. (Patient taking differently: Take 325 mg by mouth daily with breakfast. ) 60 tablet 2  . Insulin Glargine (LANTUS SOLOSTAR) 100 UNIT/ML Solostar Pen Inject 45 Units into the skin daily at 10 pm. INJECT 30 UNITS INTO SKIN AT BEDTIME (Patient taking differently: Inject 50 Units into the skin daily at  10 pm. ) 15 mL 5  . mometasone (NASONEX) 50 MCG/ACT nasal spray instill 2 sprays into each nostril once daily 17 g 0  .  omeprazole (PRILOSEC) 20 MG capsule take 2 capsules by mouth daily 60 capsule 2  . simvastatin (ZOCOR) 20 MG tablet Take 1 tablet (20 mg total) by mouth daily. 90 tablet 3  . sitaGLIPtin (JANUVIA) 50 MG tablet Take 1 tablet (50 mg total) by mouth daily. 90 tablet 3  . Cholecalciferol (VITAMIN D3) 1000 units CAPS Take 1 capsule (1,000 Units total) by mouth daily. (Patient not taking: Reported on 09/18/2016) 30 capsule 6  . Ipratropium-Albuterol (COMBIVENT) 20-100 MCG/ACT AERS respimat Inhale 1 puff into the lungs every 6 (six) hours. (Patient not taking: Reported on 09/18/2016) 1 Inhaler 5    REVIEW OF SYSTEMS:    REVIEW OF SYSTEMS (negative unless checked):   Cardiac:  [x]  Chest pain or chest pressure? "here and there" []  Shortness of breath upon activity? []  Shortness of breath when lying flat? []  Irregular heart rhythm?  Vascular:  []  Pain in calf, thigh, or hip brought on by walking? []  Pain in feet at night that wakes you up from your sleep? []  Blood clot in your veins? [x]  Leg swelling?  Pulmonary:  []  Oxygen at home? []  Productive cough? []  Wheezing?  Neurologic:  []  Sudden weakness in arms or legs? []  Sudden numbness in arms or legs? []  Sudden onset of difficult speaking or slurred speech? []  Temporary loss of vision in one eye? []  Problems with dizziness?  Gastrointestinal:  []  Blood in stool? []  Vomited blood?  Genitourinary:  []  Burning when urinating? []  Blood in urine?  Psychiatric:  []  Major depression  Hematologic:  []  Bleeding problems? []  Problems with blood clotting?  Dermatologic:  []  Rashes or ulcers?  Constitutional:  []  Fever or chills?  Ear/Nose/Throat:  []  Change in hearing? []  Nose bleeds? []  Sore throat?  Musculoskeletal:  []  Back pain? []  Joint pain? []  Muscle pain?  PHYSICAL EXAM:           Vitals:   09/18/16 1400 09/18/16 1418 09/18/16 1447 09/18/16 1500  BP: (!) 144/119 (!) 144/119 (!) 163/63 (!) 173/70  Pulse: 63 79 66 (!) 58  Resp:  20 18   Temp:      TempSrc:      SpO2: 100% 100% 97% 100%  Weight:      Height:        GENERAL: The patient is a well-nourished obese female, in no acute distress. The vital signs are documented above. HEENT: normocephalic, atraumatic, no abnormalities noted.  CARDIAC: There is a regular rate and rhythm. No carotid bruits. VASCULAR: 2+ radial and 2+ brachial pulses bilaterally. PULMONARY: There is good air exchange bilaterally without wheezing or rales. ABDOMEN: Soft and non-tender with normal pitched bowel sounds.  MUSCULOSKELETAL: There are no major deformities or cyanosis. NEUROLOGIC: No focal weakness or paresthesias are detected. SKIN: There are no ulcers or rashes noted. PSYCHIATRIC: The patient has a normal affect.  ASSESSMENT/PLAN:     CKD stage V  Patient currently does not require urgent hemodialysis. Vein mapping has been ordered. She is right-handed. We'll make access recommendations based on vein mapping results. Plan for permanent dialysis access sometime next week.   Virgina Jock, PA-C [facility] [place] [phone]   Will plan for access next week once veni mapping has been completed.  Annamarie Major   I have reviewed the patient's vein mapping.  She has marginal veins, however she may be a candidate for left brachiocephalic or basilic vein fistula.  I discussed this today with her.  She understands that I may  have to put a graft and if I cannot find an adequate vein.  I discussed the risks and benefits of the operation including the risk of steal and the need for additional procedures.  All questions are answered.  Annamarie Major

## 2016-10-01 NOTE — Op Note (Signed)
    Patient name: Joan Banks MRN: [id number] DOB: 1945-07-05 Sex: female  10/01/2016 Pre-operative Diagnosis: CKD Post-operative diagnosis:  Same Surgeon:  Annamarie Major Assistants:  S. Rhyne Procedure:   Left 1st stage basilic vein fistula Anesthesia:  General Blood Loss:  See anesthesia record Specimens: none  Findings:   The basilic vein was very healthy approximate 5 mm.  Brachial artery was nondiseased and 3 mm  Indications:  The patient is here for dialysis access  Procedure:  The patient was identified in the holding area and taken to [place] 12  The patient was then placed supine on the table. general anesthesia was administered.  The patient was prepped and draped in the usual sterile fashion.  A time out was called and antibiotics were administered.  Ultrasound was used to evaluate the basilic and cephalic vein.  The cephalic vein was difficult to visualize.  The basilic vein appeared to be very healthy and greater than 6 mm throughout.  A oblique incision was made at the antecubital crease.  I dissected out the basilic vein.  This was very healthy approximate 5 mm.  It was fully mobilized and side branches were ligated between silk ties.  Then I dissected out the brachial artery.  This was a disease-free 3.5 mm artery.  Once adequate exposure was obtained, the patient was given 3000 units of heparin.  The vein was ligated distally with a silk tie after orienting it with a ink pen.  The artery was then occluded with vascular clamps.  A #11 blade was used to make an arteriotomy which was extended longitudinally with Potts scissors.  The vein was cut to the appropriate length and spatulated to fit the size of the arteriotomy.  A running anastomosis was created with 6-0 Prolene.  Prior to completion the appropriate flushing maneuvers were performed and the anastomosis was completed.  The patient had a palpable radial pulse as well as a good thrill within the fistula.  I inspected  the course of the vein to make sure there were no kinks.  Once I was satisfied, the decision was closed with 2 layers of 3-0 Vicryl.  The patient taken recovery in stable condition.   Disposition:  To PACU stable   V. Annamarie Major, M.D. [facility] [place] Office: [phone] Pager:  [phone]

## 2016-10-01 NOTE — Progress Notes (Addendum)
Patient potassium level results 7.4, notified Dr. Marcie Bal, new orders received. Will continue to monitor.

## 2016-10-01 NOTE — Discharge Instructions (Signed)
° °  [facility]  Discharge Instructions  AV Fistula or Graft Surgery for Dialysis Access  Please refer to the following instructions for your post-procedure care. Your surgeon or physician assistant will discuss any changes with you.  Activity  You may drive the day following your surgery, if you are comfortable and no longer taking prescription pain medication. Resume full activity as the soreness in your incision resolves.  Bathing/Showering  You may shower after you go home. Keep your incision dry for 48 hours. Do not soak in a bathtub, hot tub, or swim until the incision heals completely. You may not shower if you have a hemodialysis catheter.  Incision Care  Clean your incision with mild soap and water after 48 hours. Pat the area dry with a clean towel. You do not need a bandage unless otherwise instructed. Do not apply any ointments or creams to your incision. You may have skin glue on your incision. Do not peel it off. It will come off on its own in about one week. Your arm may swell a bit after surgery. To reduce swelling use pillows to elevate your arm so it is above your heart. Your doctor will tell you if you need to lightly wrap your arm with an ACE bandage.  Diet  Resume your normal diet. There are not special food restrictions following this procedure. In order to heal from your surgery, it is CRITICAL to get adequate nutrition. Your body requires vitamins, minerals, and protein. Vegetables are the best source of vitamins and minerals. Vegetables also provide the perfect balance of protein. Processed food has little nutritional value, so try to avoid this.  Medications  Resume taking all of your medications. If your incision is causing pain, you may take over-the counter pain relievers such as acetaminophen (Tylenol). If you were prescribed a stronger pain medication, please be aware these medications can cause nausea and constipation. Prevent  nausea by taking the medication with a snack or meal. Avoid constipation by drinking plenty of fluids and eating foods with high amount of fiber, such as fruits, vegetables, and grains. Do not take Tylenol if you are taking prescription pain medications.  Follow up Your surgeon may want to see you in the office following your access surgery. If so, this will be arranged at the time of your surgery.  Please call us immediately for any of the following conditions:  Increased pain, redness, drainage (pus) from your incision site Fever of 101 degrees or higher Severe or worsening pain at your incision site Hand pain or numbness.  Reduce your risk of vascular disease:  Stop smoking. If you would like help, call [facility] at 1-800-QUIT-NOW (501)854-0748) or [place] at [place] your cholesterol Maintain a desired weight Control your diabetes Keep your blood pressure down  Dialysis  It will take several weeks to several months for your new dialysis access to be ready for use. Your surgeon will determine when it is OK to use it. Your nephrologist will continue to direct your dialysis. You can continue to use your Permcath until your new access is ready for use.   10/01/2016 Joan Banks [id number] Aug 12, 1945  Surgeon(s): Serafina Mitchell, MD  Procedure(s): CREATION LEFT ARM BRACHOBASILIC ARTERIOVENOUS (AV) FISTULA  x Do not stick fistula for 12 weeks    If you have any questions, please call the office at [phone].

## 2016-10-01 NOTE — Anesthesia Preprocedure Evaluation (Signed)
Anesthesia Evaluation  Patient identified by MRN, date of birth, ID band Patient awake    Reviewed: Allergy & Precautions, NPO status , Patient's Chart, lab work & pertinent test results  Airway Mallampati: II   Neck ROM: full    Dental   Pulmonary sleep apnea , COPD, former smoker,    breath sounds clear to auscultation       Cardiovascular hypertension, +CHF   Rhythm:regular Rate:Normal     Neuro/Psych  Neuromuscular disease    GI/Hepatic GERD  ,  Endo/Other  diabetes, Type 2Morbid obesity  Renal/GU ESRFRenal disease     Musculoskeletal  (+) Arthritis ,   Abdominal   Peds  Hematology  (+) anemia ,   Anesthesia Other Findings   Reproductive/Obstetrics                             Anesthesia Physical Anesthesia Plan  ASA: III  Anesthesia Plan: General   Post-op Pain Management:    Induction: Intravenous  PONV Risk Score and Plan: 3 and Ondansetron, Dexamethasone and Midazolam  Airway Management Planned: LMA  Additional Equipment:   Intra-op Plan:   Post-operative Plan:   Informed Consent: I have reviewed the patients History and Physical, chart, labs and discussed the procedure including the risks, benefits and alternatives for the proposed anesthesia with the patient or authorized representative who has indicated his/her understanding and acceptance.     Plan Discussed with: CRNA, Anesthesiologist and Surgeon  Anesthesia Plan Comments:         Anesthesia Quick Evaluation

## 2016-10-01 NOTE — Anesthesia Postprocedure Evaluation (Signed)
Anesthesia Post Note  Patient: Joan Banks  Procedure(s) Performed: Procedure(s) (LRB): CREATION LEFT ARM BRACHOBASILIC ARTERIOVENOUS (AV) FISTULA (Left)     Patient location during evaluation: PACU Anesthesia Type: General Level of consciousness: awake and alert Pain management: pain level controlled Vital Signs Assessment: post-procedure vital signs reviewed and stable Respiratory status: spontaneous breathing, nonlabored ventilation, respiratory function stable and patient connected to nasal cannula oxygen Cardiovascular status: blood pressure returned to baseline and stable Postop Assessment: no signs of nausea or vomiting Anesthetic complications: no    Last Vitals:  Vitals:   10/01/16 1130 10/01/16 1149  BP: (!) 101/49   Pulse: 67   Resp: 19   Temp: 36.7 C   SpO2: 98% 98%    Last Pain:  Vitals:   10/01/16 1130  PainSc: 0-No pain                 [name]

## 2016-10-01 NOTE — Transfer of Care (Signed)
Immediate Anesthesia Transfer of Care Note  Patient: Joan Banks  Procedure(s) Performed: Procedure(s): CREATION LEFT ARM BRACHOBASILIC ARTERIOVENOUS (AV) FISTULA (Left)  Patient Location: PACU  Anesthesia Type:General  Level of Consciousness: patient cooperative and responds to stimulation  Airway & Oxygen Therapy: Patient Spontanous Breathing and Patient connected to nasal cannula oxygen  Post-op Assessment: Report given to RN and Post -op Vital signs reviewed and stable  Post vital signs: Reviewed, stable  Last Vitals:  Vitals:   10/01/16 0743  BP: (!) 97/47  Pulse: (!) 57  Resp: 19  Temp: 36.6 C  SpO2: 100%    Last Pain: There were no vitals filed for this visit.    Patients Stated Pain Goal: 4 (03/15/13 5208)  Complications: No apparent anesthesia complications

## 2016-10-02 ENCOUNTER — Encounter (HOSPITAL_COMMUNITY): Payer: Self-pay | Admitting: Surgery

## 2016-10-02 ENCOUNTER — Telehealth: Payer: Self-pay | Admitting: Surgery

## 2016-10-02 NOTE — Telephone Encounter (Signed)
-----   Message from Mena Goes, RN sent at 10/01/2016 11:12 AM EDT ----- Regarding: 4-6 weeks w/ duplex   ----- Message ----- From: Gabriel Earing, PA-C Sent: 10/01/2016  10:46 AM To: Vvs Charge Pool  S/p left 1st stage BVT.  F/u with Dr. Trula Slade in 4-6 weeks with duplex.  Thanks, Aldona Bar

## 2016-10-02 NOTE — Telephone Encounter (Signed)
No answer on home #, no other # to call, mailed letter 10-15 [place] & OV

## 2016-10-03 DIAGNOSIS — J449 Chronic obstructive pulmonary disease, unspecified: Secondary | ICD-10-CM | POA: Diagnosis not present

## 2016-10-06 LAB — POCT I-STAT 4, (NA,K, GLUC, HGB,HCT)
GLUCOSE: 268 mg/dL — AB (ref 65–99)
HEMATOCRIT: 24 % — AB (ref 36.0–46.0)
HEMOGLOBIN: 8.2 g/dL — AB (ref 12.0–15.0)
Potassium: 7.4 mmol/L (ref 3.5–5.1)
Sodium: 136 mmol/L (ref 135–145)

## 2016-10-12 DIAGNOSIS — N185 Chronic kidney disease, stage 5: Secondary | ICD-10-CM | POA: Diagnosis not present

## 2016-10-12 DIAGNOSIS — D631 Anemia in chronic kidney disease: Secondary | ICD-10-CM | POA: Diagnosis not present

## 2016-10-12 DIAGNOSIS — M9080 Osteopathy in diseases classified elsewhere, unspecified site: Secondary | ICD-10-CM | POA: Diagnosis not present

## 2016-10-12 DIAGNOSIS — I120 Hypertensive chronic kidney disease with stage 5 chronic kidney disease or end stage renal disease: Secondary | ICD-10-CM | POA: Diagnosis not present

## 2016-10-12 DIAGNOSIS — E889 Metabolic disorder, unspecified: Secondary | ICD-10-CM | POA: Diagnosis not present

## 2016-10-14 ENCOUNTER — Encounter (HOSPITAL_COMMUNITY): Payer: Medicare Other

## 2016-10-15 ENCOUNTER — Other Ambulatory Visit: Payer: Self-pay | Admitting: Internal Medicine

## 2016-10-16 ENCOUNTER — Other Ambulatory Visit (HOSPITAL_COMMUNITY): Payer: Self-pay | Admitting: *Deleted

## 2016-10-18 DIAGNOSIS — G4733 Obstructive sleep apnea (adult) (pediatric): Secondary | ICD-10-CM | POA: Diagnosis not present

## 2016-10-19 ENCOUNTER — Inpatient Hospital Stay (HOSPITAL_COMMUNITY): Admission: RE | Admit: 2016-10-19 | Payer: Medicare Other | Source: Ambulatory Visit

## 2016-10-19 NOTE — Telephone Encounter (Signed)
By my review it appears our last instructions to her were to take Lasix 160mg  (2 tablets) TWICE a day and not once.  I am unclear why vascular surgery changed this dosing or if it was just reported by her that she is only taking it once.  Could we obtain nephrology notes to see what their instructions are?

## 2016-10-19 NOTE — Telephone Encounter (Signed)
Patient informed (lasix 160 mg daily). Has appt with PCP 12/01/16.

## 2016-10-19 NOTE — Telephone Encounter (Signed)
I asked patient how she takes med. She said 2 tabs (160mg ) once a day. She said "because it is what is said in the bottle". She can not recall that she was supposed to take it BID. Denies another MD telling her to take differently. pls advise with instructions so I can call patient & inform of right dose. Thanks!

## 2016-10-19 NOTE — Telephone Encounter (Signed)
Will refill the prescription as she is currently taking it the medication Lasix 160mg  daily.  I would like her to be scheduled for a repeat visit in the next month with her PCP if possible to go over this again and make sure that we are able to obtain Dr Serita Grit [facility]) notes.

## 2016-10-21 ENCOUNTER — Other Ambulatory Visit (HOSPITAL_COMMUNITY): Payer: Self-pay | Admitting: *Deleted

## 2016-10-22 ENCOUNTER — Encounter (HOSPITAL_COMMUNITY)
Admission: RE | Admit: 2016-10-22 | Discharge: 2016-10-22 | Disposition: A | Discharge status: Home / Self Care | Payer: Medicare Other | Source: Ambulatory Visit | Attending: Nephrology | Admitting: Nephrology

## 2016-10-22 DIAGNOSIS — D631 Anemia in chronic kidney disease: Secondary | ICD-10-CM | POA: Diagnosis not present

## 2016-10-22 DIAGNOSIS — N189 Chronic kidney disease, unspecified: Secondary | ICD-10-CM | POA: Diagnosis not present

## 2016-10-22 DIAGNOSIS — E1122 Type 2 diabetes mellitus with diabetic chronic kidney disease: Secondary | ICD-10-CM

## 2016-10-22 DIAGNOSIS — N185 Chronic kidney disease, stage 5: Secondary | ICD-10-CM

## 2016-10-22 LAB — POCT HEMOGLOBIN-HEMACUE: Hemoglobin: 6.2 g/dL — CL (ref 12.0–15.0)

## 2016-10-22 LAB — ABO/RH: ABO/RH(D): A POS

## 2016-10-22 LAB — PREPARE RBC (CROSSMATCH)

## 2016-10-22 MED ORDER — EPOETIN ALFA 10000 UNIT/ML IJ SOLN
INTRAMUSCULAR | Status: AC
  Filled 2016-10-22: qty 1

## 2016-10-22 MED ORDER — EPOETIN ALFA 10000 UNIT/ML IJ SOLN
10000.0000 [IU] | INTRAMUSCULAR | Status: DC
Start: 2016-10-22 — End: 2016-10-23
  Administered 2016-10-22: 10000 [IU] via SUBCUTANEOUS

## 2016-10-22 MED ORDER — SODIUM CHLORIDE 0.9 % IV SOLN: Freq: Once | INTRAVENOUS | Status: DC

## 2016-10-22 MED ORDER — CLONIDINE HCL 0.1 MG PO TABS
0.1000 mg | ORAL_TABLET | ORAL | Status: DC | PRN
Start: 2016-10-22 — End: 2016-10-23

## 2016-10-23 LAB — BPAM RBC
BLOOD PRODUCT EXPIRATION DATE: 201809252359
Blood Product Expiration Date: 201809252359
ISSUE DATE / TIME: 201809201235
ISSUE DATE / TIME: 201809200955
UNIT TYPE AND RH: 6200
Unit Type and Rh: 6200

## 2016-10-23 LAB — TYPE AND SCREEN
ABO/RH(D): A POS
Antibody Screen: NEGATIVE
UNIT DIVISION: 0
UNIT DIVISION: 0

## 2016-10-28 ENCOUNTER — Other Ambulatory Visit: Payer: Self-pay | Admitting: *Deleted

## 2016-10-28 DIAGNOSIS — G4730 Sleep apnea, unspecified: Secondary | ICD-10-CM

## 2016-10-29 ENCOUNTER — Encounter (HOSPITAL_COMMUNITY)
Admission: RE | Admit: 2016-10-29 | Discharge: 2016-10-29 | Disposition: A | Discharge status: Home / Self Care | Payer: Medicare Other | Source: Ambulatory Visit | Attending: Nephrology | Admitting: Nephrology

## 2016-10-29 DIAGNOSIS — D631 Anemia in chronic kidney disease: Secondary | ICD-10-CM | POA: Diagnosis not present

## 2016-10-29 DIAGNOSIS — N185 Chronic kidney disease, stage 5: Principal | ICD-10-CM

## 2016-10-29 DIAGNOSIS — E1122 Type 2 diabetes mellitus with diabetic chronic kidney disease: Secondary | ICD-10-CM

## 2016-10-29 DIAGNOSIS — N189 Chronic kidney disease, unspecified: Secondary | ICD-10-CM | POA: Diagnosis not present

## 2016-10-29 LAB — POCT HEMOGLOBIN-HEMACUE: Hemoglobin: 9.1 g/dL — ABNORMAL LOW (ref 12.0–15.0)

## 2016-10-29 MED ORDER — EPOETIN ALFA 10000 UNIT/ML IJ SOLN
10000.0000 [IU] | INTRAMUSCULAR | Status: DC
  Administered 2016-10-29: 10000 [IU] via SUBCUTANEOUS

## 2016-10-29 MED ORDER — CLONIDINE HCL 0.1 MG PO TABS: 0.1000 mg | ORAL_TABLET | ORAL | Status: DC | PRN

## 2016-10-29 MED ORDER — EPOETIN ALFA 10000 UNIT/ML IJ SOLN
INTRAMUSCULAR | Status: AC
  Administered 2016-10-29: 09:00:00 10000 [IU] via SUBCUTANEOUS
  Filled 2016-10-29: qty 1

## 2016-10-29 MED ORDER — OMEPRAZOLE 20 MG PO CPDR: 40.0000 mg | DELAYED_RELEASE_CAPSULE | Freq: Every day | ORAL | 1 refills | Status: AC

## 2016-11-02 DIAGNOSIS — J449 Chronic obstructive pulmonary disease, unspecified: Secondary | ICD-10-CM | POA: Diagnosis not present

## 2016-11-04 ENCOUNTER — Other Ambulatory Visit: Payer: Self-pay

## 2016-11-04 DIAGNOSIS — N186 End stage renal disease: Principal | ICD-10-CM

## 2016-11-04 DIAGNOSIS — Z48812 Encounter for surgical aftercare following surgery on the circulatory system: Secondary | ICD-10-CM

## 2016-11-04 DIAGNOSIS — E1122 Type 2 diabetes mellitus with diabetic chronic kidney disease: Secondary | ICD-10-CM

## 2016-11-09 ENCOUNTER — Other Ambulatory Visit (HOSPITAL_COMMUNITY): Payer: Self-pay | Admitting: *Deleted

## 2016-11-10 ENCOUNTER — Ambulatory Visit (HOSPITAL_COMMUNITY)
Admission: RE | Admit: 2016-11-10 | Discharge: 2016-11-10 | Disposition: A | Discharge status: Home / Self Care | Payer: Medicare Other | Source: Ambulatory Visit | Attending: Nephrology | Admitting: Nephrology

## 2016-11-10 DIAGNOSIS — Z79899 Other long term (current) drug therapy: Secondary | ICD-10-CM | POA: Diagnosis not present

## 2016-11-10 DIAGNOSIS — D631 Anemia in chronic kidney disease: Secondary | ICD-10-CM | POA: Diagnosis not present

## 2016-11-10 DIAGNOSIS — N185 Chronic kidney disease, stage 5: Secondary | ICD-10-CM | POA: Diagnosis not present

## 2016-11-10 DIAGNOSIS — Z5181 Encounter for therapeutic drug level monitoring: Secondary | ICD-10-CM | POA: Insufficient documentation

## 2016-11-10 DIAGNOSIS — E1122 Type 2 diabetes mellitus with diabetic chronic kidney disease: Secondary | ICD-10-CM

## 2016-11-10 LAB — RENAL FUNCTION PANEL
ALBUMIN: 3.6 g/dL (ref 3.5–5.0)
ANION GAP: 13 (ref 5–15)
BUN: 118 mg/dL — AB (ref 6–20)
CO2: 17 mmol/L — AB (ref 22–32)
Calcium: 9.4 mg/dL (ref 8.9–10.3)
Chloride: 109 mmol/L (ref 101–111)
Creatinine, Ser: 6.31 mg/dL — ABNORMAL HIGH (ref 0.44–1.00)
GFR calc Af Amer: 7 mL/min — ABNORMAL LOW (ref >60)
GFR calc non Af Amer: 6 mL/min — ABNORMAL LOW (ref >60)
GLUCOSE: 220 mg/dL — AB (ref 65–99)
PHOSPHORUS: 5.8 mg/dL — AB (ref 2.5–4.6)
POTASSIUM: 5 mmol/L (ref 3.5–5.1)
SODIUM: 139 mmol/L (ref 135–145)

## 2016-11-10 LAB — URINALYSIS, COMPLETE (UACMP) WITH MICROSCOPIC
Bilirubin Urine: NEGATIVE
Glucose, UA: NEGATIVE mg/dL
Hgb urine dipstick: NEGATIVE
Ketones, ur: NEGATIVE mg/dL
Nitrite: NEGATIVE
Protein, ur: 30 mg/dL — AB
Specific Gravity, Urine: 1.009 (ref 1.005–1.030)
pH: 5 (ref 5.0–8.0)

## 2016-11-10 LAB — CBC
HCT: 29.7 % — ABNORMAL LOW (ref 36.0–46.0)
HEMOGLOBIN: 8.8 g/dL — AB (ref 12.0–15.0)
MCH: 23.7 pg — ABNORMAL LOW (ref 26.0–34.0)
MCHC: 29.6 g/dL — AB (ref 30.0–36.0)
MCV: 80.1 fL (ref 78.0–100.0)
Platelets: 201 10*3/uL (ref 150–400)
RBC: 3.71 MIL/uL — ABNORMAL LOW (ref 3.87–5.11)
RDW: 15.2 % (ref 11.5–15.5)
WBC: 9.2 10*3/uL (ref 4.0–10.5)

## 2016-11-10 LAB — MAGNESIUM: MAGNESIUM: 1.2 mg/dL — AB (ref 1.7–2.4)

## 2016-11-10 MED ORDER — CLONIDINE HCL 0.1 MG PO TABS: 0.1000 mg | ORAL_TABLET | ORAL | Status: DC | PRN

## 2016-11-10 MED ORDER — EPOETIN ALFA 10000 UNIT/ML IJ SOLN
10000.0000 [IU] | INTRAMUSCULAR | Status: DC
  Administered 2016-11-10: 10000 [IU] via SUBCUTANEOUS

## 2016-11-10 MED ORDER — EPOETIN ALFA 10000 UNIT/ML IJ SOLN
INTRAMUSCULAR | Status: AC
  Filled 2016-11-10: qty 1

## 2016-11-11 LAB — VITAMIN D 25 HYDROXY (VIT D DEFICIENCY, FRACTURES): VIT D 25 HYDROXY: 16.6 ng/mL — AB (ref 30.0–100.0)

## 2016-11-11 LAB — PTH, INTACT AND CALCIUM
CALCIUM TOTAL (PTH): 9.3 mg/dL (ref 8.7–10.3)
PTH: 655 pg/mL — AB (ref 15–65)

## 2016-11-13 ENCOUNTER — Encounter (HOSPITAL_COMMUNITY): Payer: Medicare Other

## 2016-11-16 ENCOUNTER — Encounter (HOSPITAL_COMMUNITY): Payer: Medicare Other

## 2016-11-16 ENCOUNTER — Encounter: Payer: Medicare Other | Admitting: Surgery

## 2016-11-17 DIAGNOSIS — G4733 Obstructive sleep apnea (adult) (pediatric): Secondary | ICD-10-CM | POA: Diagnosis not present

## 2016-11-26 ENCOUNTER — Emergency Department (HOSPITAL_COMMUNITY)
Admission: EM | Admit: 2016-11-26 | Discharge: 2016-12-03 | Disposition: E | Payer: Medicare Other | Attending: Emergency Medicine | Admitting: Emergency Medicine

## 2016-11-26 DIAGNOSIS — Z87891 Personal history of nicotine dependence: Secondary | ICD-10-CM | POA: Insufficient documentation

## 2016-11-26 DIAGNOSIS — I509 Heart failure, unspecified: Secondary | ICD-10-CM | POA: Insufficient documentation

## 2016-11-26 DIAGNOSIS — N185 Chronic kidney disease, stage 5: Secondary | ICD-10-CM | POA: Diagnosis not present

## 2016-11-26 DIAGNOSIS — J449 Chronic obstructive pulmonary disease, unspecified: Secondary | ICD-10-CM | POA: Insufficient documentation

## 2016-11-26 DIAGNOSIS — Z79899 Other long term (current) drug therapy: Secondary | ICD-10-CM | POA: Insufficient documentation

## 2016-11-26 DIAGNOSIS — Z7982 Long term (current) use of aspirin: Secondary | ICD-10-CM | POA: Insufficient documentation

## 2016-11-26 DIAGNOSIS — Z794 Long term (current) use of insulin: Secondary | ICD-10-CM | POA: Insufficient documentation

## 2016-11-26 DIAGNOSIS — I132 Hypertensive heart and chronic kidney disease with heart failure and with stage 5 chronic kidney disease, or end stage renal disease: Secondary | ICD-10-CM | POA: Diagnosis not present

## 2016-11-26 DIAGNOSIS — E1140 Type 2 diabetes mellitus with diabetic neuropathy, unspecified: Secondary | ICD-10-CM | POA: Diagnosis not present

## 2016-11-26 DIAGNOSIS — I469 Cardiac arrest, cause unspecified: Secondary | ICD-10-CM

## 2016-11-26 LAB — I-STAT VENOUS BLOOD GAS, ED
Acid-base deficit: 16 mmol/L — ABNORMAL HIGH (ref 0.0–2.0)
Bicarbonate: 14.6 mmol/L — ABNORMAL LOW (ref 20.0–28.0)
O2 Saturation: 82 %
PCO2 VEN: 63.6 mmHg — AB (ref 44.0–60.0)
TCO2: 17 mmol/L — ABNORMAL LOW (ref 22–32)
pH, Ven: 6.97 — CL (ref 7.250–7.430)
pO2, Ven: 72 mmHg — ABNORMAL HIGH (ref 32.0–45.0)

## 2016-11-26 LAB — I-STAT CG4 LACTIC ACID, ED: LACTIC ACID, VENOUS: 11.25 mmol/L — AB (ref 0.5–1.9)

## 2016-11-26 LAB — I-STAT CHEM 8, ED
BUN: 131 mg/dL — AB (ref 6–20)
CALCIUM ION: 1.13 mmol/L — AB (ref 1.15–1.40)
Chloride: 110 mmol/L (ref 101–111)
Creatinine, Ser: 6.4 mg/dL — ABNORMAL HIGH (ref 0.44–1.00)
Glucose, Bld: 231 mg/dL — ABNORMAL HIGH (ref 65–99)
HEMATOCRIT: 23 % — AB (ref 36.0–46.0)
HEMOGLOBIN: 7.8 g/dL — AB (ref 12.0–15.0)
Potassium: 5.1 mmol/L (ref 3.5–5.1)
SODIUM: 148 mmol/L — AB (ref 135–145)
TCO2: 19 mmol/L — AB (ref 22–32)

## 2016-11-26 LAB — I-STAT TROPONIN, ED: TROPONIN I, POC: 0.03 ng/mL (ref 0.00–0.08)

## 2016-11-26 LAB — CBG MONITORING, ED: Glucose-Capillary: 198 mg/dL — ABNORMAL HIGH (ref 65–99)

## 2016-11-26 MED ORDER — SODIUM BICARBONATE 8.4 % IV SOLN
INTRAVENOUS | Status: DC | PRN
  Administered 2016-11-26 (×2): 50 meq via INTRAVENOUS

## 2016-11-26 MED ORDER — CALCIUM CHLORIDE 10 % IV SOLN
INTRAVENOUS | Status: DC | PRN
  Administered 2016-11-26: 1 g via INTRAVENOUS

## 2016-11-26 MED ORDER — EPINEPHRINE PF 1 MG/10ML IJ SOSY
PREFILLED_SYRINGE | INTRAMUSCULAR | Status: DC | PRN
  Administered 2016-11-26 (×7): 1 mg via INTRAVENOUS

## 2016-11-27 MED FILL — Medication: Qty: 1 | Status: AC

## 2016-11-30 ENCOUNTER — Encounter: Payer: Medicare Other | Admitting: Surgery

## 2016-11-30 ENCOUNTER — Encounter (HOSPITAL_COMMUNITY): Payer: Medicare Other

## 2016-12-01 ENCOUNTER — Encounter: Payer: Medicare Other | Admitting: Internal Medicine

## 2016-12-03 NOTE — Progress Notes (Signed)
Chaplain relieved night  On call chaplain with spouse of Pt. Nurse notified spouse that Pt transitioned. Spouse was alone and felt he had no one to help him. They were married 51 years and depended on each other. Chaplain contact social worker who help reach Pt sister. Chaplain stayed with spouse and  Brought remembrance and help with processing grieve. Chaplain escorted spouse and family when they arrived to view Pt. Chaplain help with the process of getting funeral home and prayer with family. Chaplain left family to discuss arraignments    12-13-2016 1100  Clinical Encounter Type  Visited With Family  Visit Type Death  Referral From Chaplain  Spiritual Encounters  Spiritual Needs Prayer;Grief support  Stress Factors  Family Stress Factors Loss

## 2016-12-03 NOTE — ED Provider Notes (Signed)
[facility] EMERGENCY DEPARTMENT Provider Note   CSN: [id number] Arrival date & time: 2016-12-16  3220     History   Chief Complaint Chief Complaint  Patient presents with  . Cardiac Arrest    HPI Joan Banks is a 71 y.o. female.  The history is provided by the EMS personnel and the spouse. No language interpreter was used.    Joan Banks is a 71 y.o. female who presents to the Emergency Department complaining of cardiac arrest.  Level  V caveat due to unresponsiveness.  Per EMS report she was altered and found unresponsive just prior to ED arrival.  She was found to be in V. tach and was defibrillated followed by PEA.  She was treated with amiodarone and CPR.  She did have brief return of spontaneous circulation to have recurrence cardiac arrest with V. fib, PEA and asystole.  She does have a history of CKD and is getting prepared for dialysis.  Her husband reports that she has been feeling very poorly over the last several days and has been experiencing very high blood sugars.  Past Medical History:  Diagnosis Date  . Anemia, iron deficiency   . Arthritis   . 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 Eston Esters.  . CHF (congestive heart failure) ([place])   . Chronic renal insufficiency   . COPD (chronic obstructive pulmonary disease) ([place])   . Dental caries   . Diabetes mellitus   . Diabetic peripheral neuropathy ([place])   . Gastritis and duodenitis 1996   H. pylori positive  . GERD (gastroesophageal reflux disease)   . Heart murmur    " I thought that I was born with a heart murmur."  . Hyperlipidemia   . Hypertension   . Low back pain   . OSA (obstructive sleep apnea)    Sleep study 12/23/2003 showed very severe obstructive sleep apnea/hypopnea syndrome, RDI 136 per hour, with severe oxygen desaturation to 50% on room  air before CPAP.  Marland Kitchen Uterine leiomyoma    Hx of massive leiomyomata uteri; S/P total abdominal hysterectomy, right salpingo-oophorectomy, and debulking of benign fibroid from vesicouterine space 11/18/99   . Visual impairment     Patient Active Problem List   Diagnosis Date Noted  . Hyperkalemia 09/18/2016  . Vitamin D deficiency 05/01/2014  . Preventative health care 09/16/2011  . Bilateral chronic knee pain 08/15/2008  . CKD stage 5 due to type 2 diabetes mellitus ([place]) 12/15/2007  . History of ductal carcinoma in situ (DCIS) of breast 09/23/2007  . Type 2 diabetes mellitus with peripheral neuropathy ([place]) 03/10/2006  . Hyperlipidemia associated with type 2 diabetes mellitus ([place]) 03/10/2006  . Anemia associated with chronic renal failure 03/10/2006  . Hypertension associated with diabetes ([place]) 03/10/2006  . COPD (chronic obstructive pulmonary disease) ([place]) 03/10/2006  . Sleep apnea 03/10/2006    Past Surgical History:  Procedure Laterality Date  . AV FISTULA PLACEMENT Left 10/01/2016   Procedure: CREATION LEFT ARM BRACHOBASILIC ARTERIOVENOUS (AV) FISTULA;  Surgeon: Serafina Mitchell, MD;  Location: [facility];  Service: Vascular;  Laterality: Left;  . BREAST BIOPSY  2003   Benign Core   . BREAST BIOPSY  1999   Stereo Core   . BREAST LUMPECTOMY Left 02/1997   History of DCIS, status post lumpectomy and sentinel lymph node dissection January 1999; on  tamoxifen for 5 years; followed by Dr. Eston Esters.  . CONDYLOMA EXCISION/FULGURATION  11/18/1999   Perianal condylomas.  Marland Kitchen EYE SURGERY     left eye  . LIPOMA EXCISION  07/14/1998 & 04/14/2001   S/P excision of lipoma, right shoulder measured as 6 cm x 4 cm by Dr. Kathrin Penner 07/14/1998.  Marland Kitchen TOTAL ABDOMINAL HYSTERECTOMY  11/18/1999   Hx of massive leiomyomata uteri; S/P total abdominal hysterectomy, right salpingo-oophorectomy, and debulking of benign fibroid from vesicouterine space 11/18/99.    OB History    No data available        Home Medications    Prior to Admission medications   Medication Sig Start Date End Date Taking? Authorizing Provider  amLODipine (NORVASC) 10 MG tablet take 1 tablet by mouth every morning Patient taking differently: Take 10mg  by mouth every morning 05/28/16   Rivet, Sindy Guadeloupe, MD  aspirin 81 MG EC tablet Take 1 tablet (81 mg total) by mouth daily. 01/21/11   Bertha Stakes, MD  atenolol (TENORMIN) 50 MG tablet Take 2 tablets (100 mg total) by mouth daily. 10/01/16   Rhyne, Hulen Shouts, PA-C  cloNIDine (CATAPRES) 0.3 MG tablet Take 1 tablet (0.3 mg total) by mouth 3 (three) times daily. Patient taking differently: Take 0.9 mg by mouth 2 (two) times daily.  03/17/16   Rivet, Sindy Guadeloupe, MD  Darbepoetin Alfa (ARANESP) 100 MCG/0.5ML SOSY injection Inject 0.5 mLs (100 mcg total) into the skin every Friday at 6 PM. 09/25/16   Colbert Ewing, MD  ferrous sulfate 325 (65 FE) MG tablet Take 1 tablet (325 mg total) by mouth daily. 10/01/16   Rhyne, Hulen Shouts, PA-C  furosemide (LASIX) 80 MG tablet Take 2 tablets (160 mg total) by mouth daily. 10/01/16   Rhyne, Hulen Shouts, PA-C  furosemide (LASIX) 80 MG tablet Take 2 tablets (160 mg total) by mouth daily. 10/19/16   Lucious Groves, DO  Insulin Glargine (LANTUS SOLOSTAR) 100 UNIT/ML Solostar Pen Inject 50 Units into the skin daily at 10 pm. 10/01/16   Rhyne, Hulen Shouts, PA-C  Ipratropium-Albuterol (COMBIVENT) 20-100 MCG/ACT AERS respimat Inhale 1 puff into the lungs every 6 (six) hours as needed (for wheezing/shortness of breath). 10/01/16   Rhyne, Hulen Shouts, PA-C  KIONEX 15 GM/60ML suspension Take 60 mg by mouth once. 09/25/16   [provider]  mometasone (NASONEX) 50 MCG/ACT nasal spray instill 2 sprays into each nostril once daily 05/12/16   Rivet, Sindy Guadeloupe, MD  omeprazole (PRILOSEC) 20 MG capsule Take 2 capsules (40 mg total) by mouth daily. 10/29/16   Tawny Asal, MD  oxyCODONE-acetaminophen (ROXICET) 5-325 MG tablet Take 1 tablet by mouth every 6  (six) hours as needed for severe pain. 10/01/16   Rhyne, Hulen Shouts, PA-C  simvastatin (ZOCOR) 20 MG tablet Take 1 tablet (20 mg total) by mouth daily. 03/17/16   Rivet, Sindy Guadeloupe, MD  sitaGLIPtin (JANUVIA) 50 MG tablet Take 1 tablet (50 mg total) by mouth daily. 03/17/16   Rivet, Sindy Guadeloupe, MD    Family History Family History  Problem Relation Age of Onset  . Breast cancer Sister   . Cancer Mother        Marena Chancy of type.    Social History Social History  Substance Use Topics  . Smoking status: Former Smoker    Packs/day: 0.50    Years: 30.00    Types: Cigarettes    Quit date: 02/03/2000  . Smokeless tobacco: Never Used  . Alcohol use No  Allergies   Patient has no known allergies.   Review of Systems Review of Systems  Unable to perform ROS: Patient unresponsive     Physical Exam Updated Vital Signs There were no vitals taken for this visit.  Physical Exam  Constitutional: She is oriented to person, place, and time. She appears well-nourished.  HENT:  Head: Normocephalic and atraumatic.  Eyes:  Right pupil with dense cataract.  Left pupil dilated and unreactive.  Cardiovascular:  no spontaneous cardiac activity  Pulmonary/Chest:  Decreased air movement bilaterally, bagging respirations with a King airway in place  Abdominal:  Distended abdomen with midline surgical scar  Musculoskeletal:  IO line in the left anterior tibia, trace edema to bilateral lower extremities.  2+ femoral pulses with chest compressions.  Neurological: She is alert and oriented to person, place, and time.  Skin: Skin is warm and dry.  Psychiatric:  Unable to assess  Nursing note and vitals reviewed.    ED Treatments / Results  Labs (all labs ordered are listed, but only abnormal results are displayed) Labs Reviewed  I-STAT CHEM 8, ED - Abnormal; Notable for the following:       Result Value   Sodium 148 (*)    BUN 131 (*)    Creatinine, Ser 6.40 (*)    Glucose, Bld 231 (*)     Calcium, Ion 1.13 (*)    TCO2 19 (*)    Hemoglobin 7.8 (*)    HCT 23.0 (*)    All other components within normal limits  I-STAT VENOUS BLOOD GAS, ED - Abnormal; Notable for the following:    pH, Ven 6.970 (*)    pCO2, Ven 63.6 (*)    pO2, Ven 72.0 (*)    Bicarbonate 14.6 (*)    TCO2 17 (*)    Acid-base deficit 16.0 (*)    All other components within normal limits  I-STAT CG4 LACTIC ACID, ED - Abnormal; Notable for the following:    Lactic Acid, Venous 11.25 (*)    All other components within normal limits  I-STAT TROPONIN, ED    EKG  EKG Interpretation None       Radiology No results found.  Procedures Procedures (including critical care time) CRITICAL CARE Performed by: Quintella Reichert   Total critical care time: 35 minutes  Critical care time was exclusive of separately billable procedures and treating other patients.  Critical care was necessary to treat or prevent imminent or life-threatening deterioration.  Critical care was time spent personally by me on the following activities: development of treatment plan with patient and/or surrogate as well as nursing, discussions with consultants, evaluation of patient's response to treatment, examination of patient, obtaining history from patient or surrogate, ordering and performing treatments and interventions, ordering and review of laboratory studies, ordering and review of radiographic studies, pulse oximetry and re-evaluation of patient's condition.   Medications Ordered in ED Medications  EPINEPHrine (ADRENALIN) 1 MG/10ML injection (1 mg Intravenous Given 2016-12-22 0835)  calcium chloride injection (1 g Intravenous Given 12/22/16 0819)  sodium bicarbonate injection (50 mEq Intravenous Given 12/22/2016 0834)     Initial Impression / Assessment and Plan / ED Course  I have reviewed the triage vital signs and the nursing notes.  Pertinent labs & imaging results that were available during my care of the patient were  reviewed by me and considered in my medical decision making (see chart for details).     Patient brought to the emergency department CPR in process.  She did have V. tach and V. fib prior to ED arrival as well as brief ROS C.  On ED arrival she is in cardiac arrest and received CPR.  Please see code sheet for details of drugs.  Throughout her ED stay she remained in asystole with no spontaneous cardiac activity.  She did have good pulses with CPR.  There was no return of circulation despite multiple doses of epinephrine, sodium bicarbonate, calcium gluconate.  She was pronounced and family was notified. Final Clinical Impressions(s) / ED Diagnoses   Final diagnoses:  Cardiac arrest [facility])    New Prescriptions New Prescriptions   No medications on file     Quintella Reichert, MD December 06, 2016 1654

## 2016-12-03 NOTE — Code Documentation (Signed)
King airway in place  

## 2016-12-03 NOTE — Code Documentation (Signed)
Per MD Ralene Bathe, no ME case, am able to remove all lines.

## 2016-12-03 NOTE — Code Documentation (Signed)
Patient time of death occurred at 08:40 called by MD Ralene Bathe

## 2016-12-03 NOTE — Progress Notes (Signed)
CSW sat with pt's husband and chaplain to assist in locating family. At this time CSW was able to reach pt's sister and other relatives who are coming to the hospital to be with family. CSW will continue to offer support for family.    Joan Banks, MSW, [facility] Clinical Social Worker [phone]

## 2016-12-03 NOTE — Code Documentation (Addendum)
Pt arrives to ER via [facility] from home as CPR in progress. Husband activated 60 for AMS, states thought it was related to patient diabetes - husband states blood sugars have been running high and states patient has not been feeling well. On EMS arrival, patient described as sitting upright in a her chair, staring off, breathing on her own, placed on cardiac monitor found to be in VT @ 210, synchronized cardioversion @ 100 joules, proceeded to become pulseless in V Fib, was shocked once with ROSC, kept pulses for 10-15 minutes when EMS reports patient had possible loss of airway when ETCO2 dropped, airway was changed out with ETCO2 improving, however patient went into PEA in 20's. Received a total of 3 epi in route, 1 amp of bicarb (50 mEq), 150 mg amiodorone and 1 liter of NS through left intraosseous tib/fib line. Pt is being prepared for dialysis in the coming months with new left forearm fistula present. Husband accompanied patient into ER.

## 2016-12-03 DEATH — deceased

## 2018-11-15 IMAGING — DX DG CHEST 2V
2 series · 2 of 2 positions shown · non-contrast
Comparison: 06/30/2012 chest radiograph.

CLINICAL DATA: Dyspnea on exertion. Chronic renal failure. Lower
extremity edema.

EXAM:
CHEST  2 VIEW

[w chest pa]
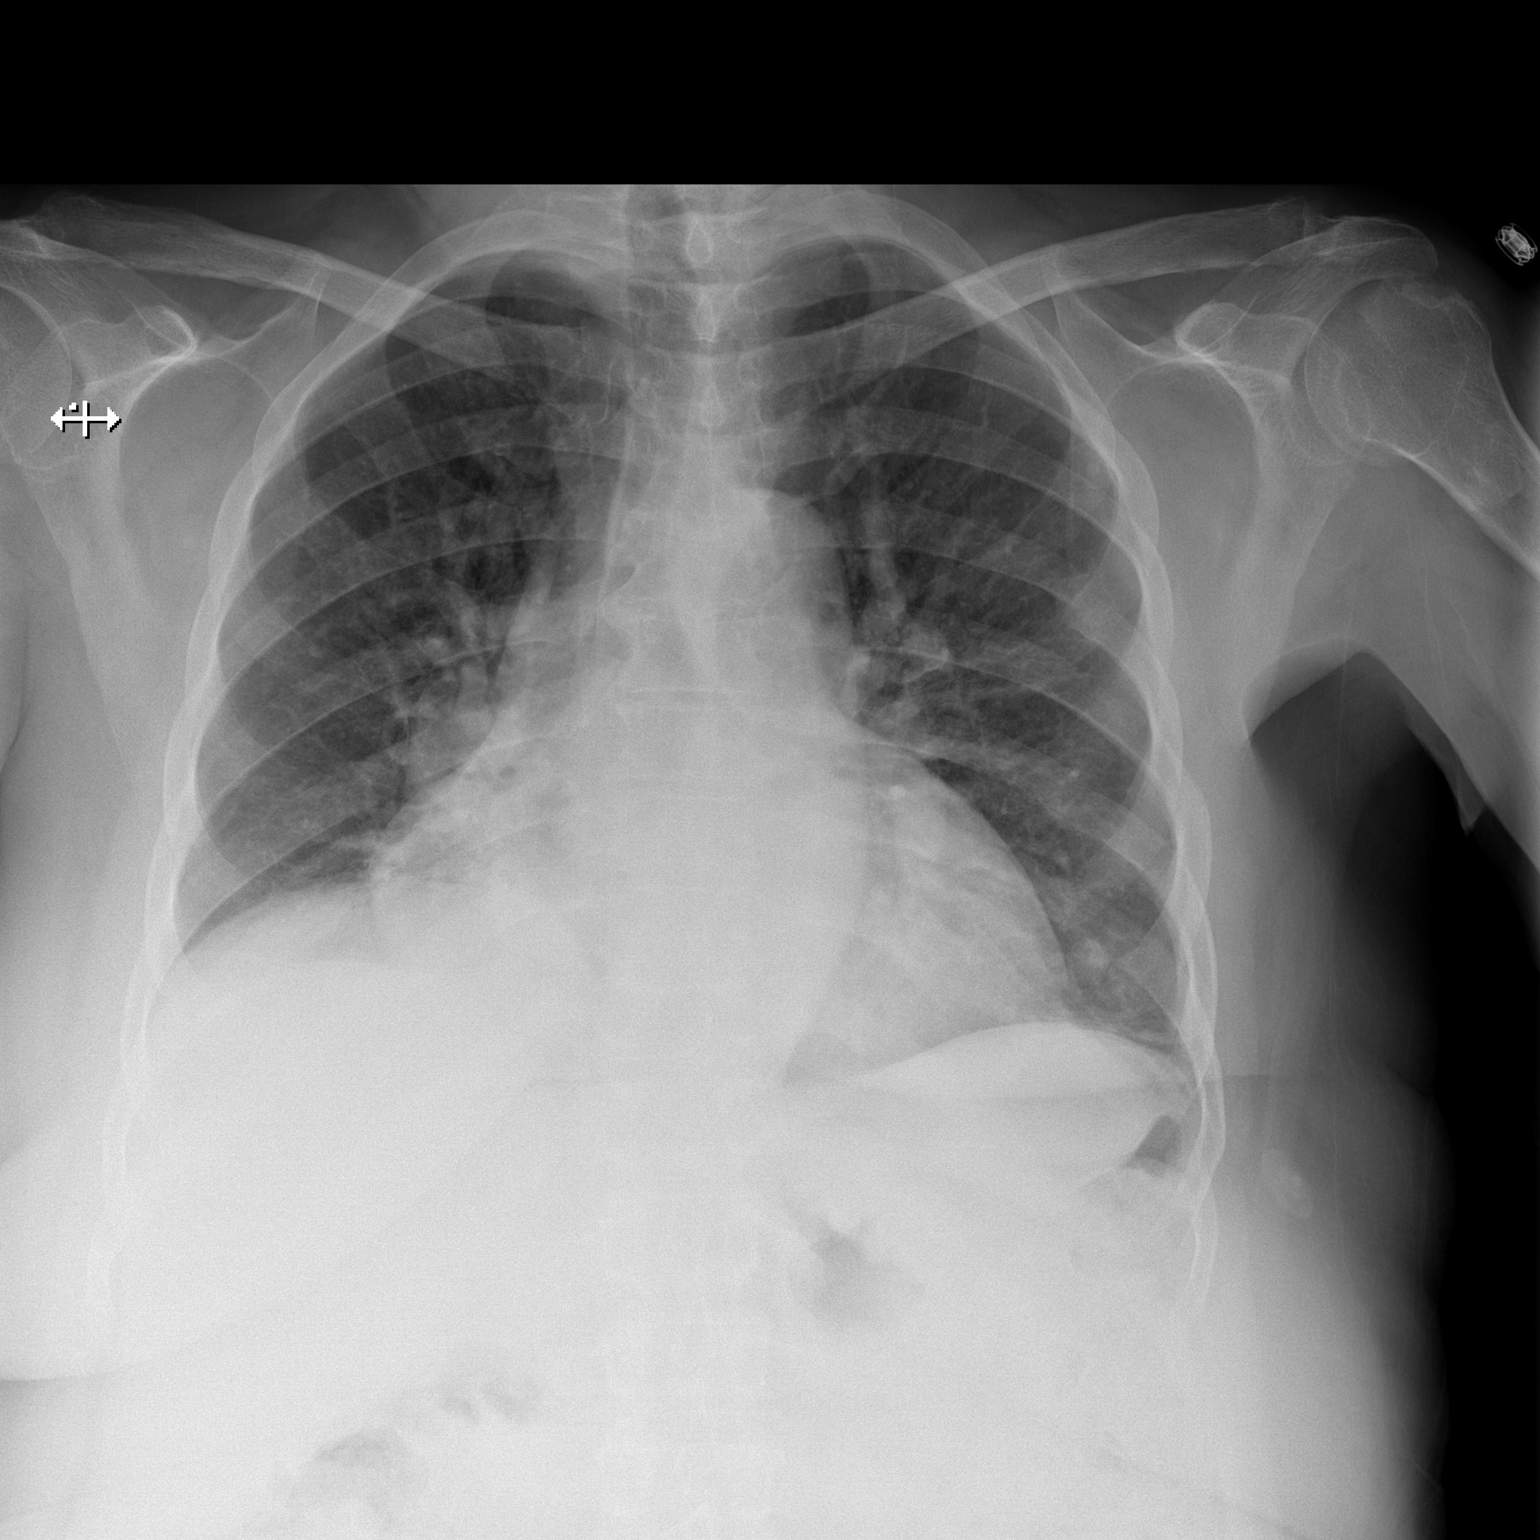

[w chest lat]
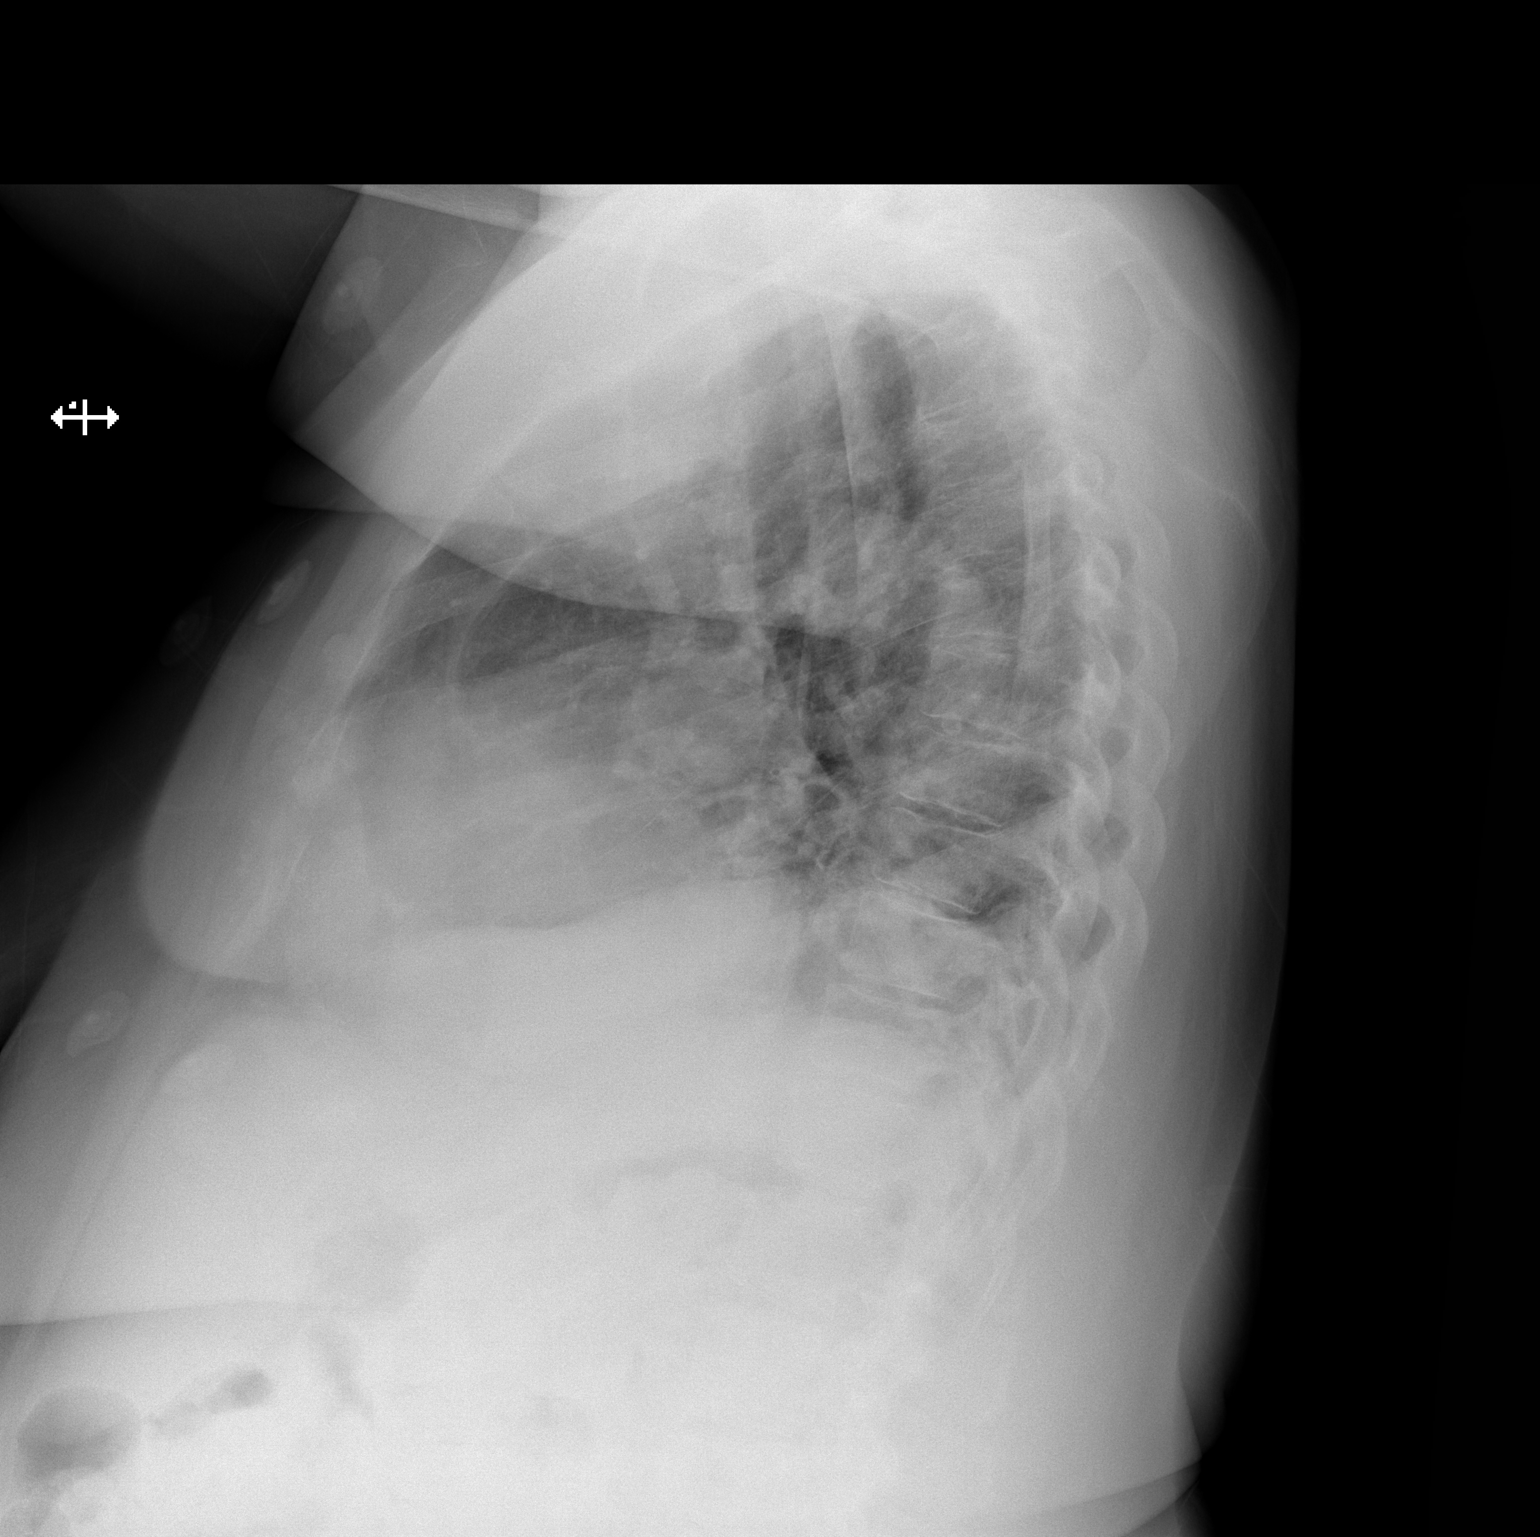

[2 of 2 positions shown; findings below may reference images not displayed]

FINDINGS: Stable cardiomediastinal silhouette with mild cardiomegaly and
aortic atherosclerosis. No pneumothorax. No pleural effusion. Patchy
right lung base opacity. Cephalization of the pulmonary vasculature
with borderline mild pulmonary edema.
IMPRESSION: 1. Cardiomegaly, cephalization of the pulmonary vasculature and
borderline mild pulmonary edema, suggesting mild congestive heart
failure.
2. Patchy right lung base opacity, which could represent
atelectasis, aspiration or pneumonia. Recommend follow-up PA and
lateral post treatment chest radiographs in 4-6 weeks.
